# Patient Record
Sex: Female | Born: 1955
Health system: Southern US, Community
[De-identification: ages and names within clinical notes are randomized; demographics above are authoritative.]

## PROBLEM LIST (undated history)

## (undated) DIAGNOSIS — M1990 Unspecified osteoarthritis, unspecified site: Secondary | ICD-10-CM

## (undated) DIAGNOSIS — C801 Malignant (primary) neoplasm, unspecified: Secondary | ICD-10-CM

## (undated) DIAGNOSIS — E785 Hyperlipidemia, unspecified: Secondary | ICD-10-CM

## (undated) DIAGNOSIS — J45909 Unspecified asthma, uncomplicated: Secondary | ICD-10-CM

## (undated) DIAGNOSIS — T7840XA Allergy, unspecified, initial encounter: Secondary | ICD-10-CM

## (undated) DIAGNOSIS — C73 Malignant neoplasm of thyroid gland: Secondary | ICD-10-CM

## (undated) DIAGNOSIS — K219 Gastro-esophageal reflux disease without esophagitis: Secondary | ICD-10-CM

## (undated) DIAGNOSIS — I10 Essential (primary) hypertension: Secondary | ICD-10-CM

## (undated) DIAGNOSIS — E079 Disorder of thyroid, unspecified: Secondary | ICD-10-CM

## (undated) HISTORY — DX: Malignant neoplasm of thyroid gland: C73

## (undated) HISTORY — DX: Hyperlipidemia, unspecified: E78.5

## (undated) HISTORY — DX: Unspecified asthma, uncomplicated: J45.909

## (undated) HISTORY — DX: Allergy, unspecified, initial encounter: T78.40XA

## (undated) HISTORY — DX: Disorder of thyroid, unspecified: E07.9

## (undated) HISTORY — DX: Malignant (primary) neoplasm, unspecified: C80.1

## (undated) HISTORY — DX: Gastro-esophageal reflux disease without esophagitis: K21.9

## (undated) HISTORY — DX: Unspecified osteoarthritis, unspecified site: M19.90

## (undated) HISTORY — DX: Essential (primary) hypertension: I10

---

## 1980-09-12 HISTORY — PX: CHOLECYSTECTOMY: SHX55

## 1991-09-13 HISTORY — PX: ABDOMINAL HYSTERECTOMY: SHX81

## 1993-09-12 HISTORY — PX: OTHER SURGICAL HISTORY: SHX169

## 2006-09-12 HISTORY — PX: OTHER SURGICAL HISTORY: SHX169

## 2007-09-13 HISTORY — PX: OTHER SURGICAL HISTORY: SHX169

## 2010-09-12 HISTORY — PX: OTHER SURGICAL HISTORY: SHX169

## 2016-10-17 LAB — BASIC METABOLIC PANEL
BUN: 16 (ref 4–21)
Creatinine: 0.6 (ref 0.5–1.1)
Glucose: 108
Potassium: 4.5 (ref 3.4–5.3)
Sodium: 139 (ref 137–147)

## 2016-10-17 LAB — LIPID PANEL: LDL Cholesterol: 103

## 2016-10-17 LAB — HEPATIC FUNCTION PANEL: ALT: 22 (ref 7–35)

## 2018-07-13 HISTORY — PX: LAPAROSCOPIC GASTRIC SLEEVE RESECTION: SHX5895

## 2019-06-07 ENCOUNTER — Ambulatory Visit (INDEPENDENT_AMBULATORY_CARE_PROVIDER_SITE_OTHER): Payer: Medicare HMO | Admitting: Family Medicine

## 2019-06-07 ENCOUNTER — Other Ambulatory Visit: Payer: Self-pay

## 2019-06-07 ENCOUNTER — Encounter: Payer: Self-pay | Admitting: Family Medicine

## 2019-06-07 VITALS — BP 126/82 | HR 69 | Temp 98.1°F | Ht 67.0 in | Wt 225.6 lb

## 2019-06-07 DIAGNOSIS — R350 Frequency of micturition: Secondary | ICD-10-CM

## 2019-06-07 DIAGNOSIS — N951 Menopausal and female climacteric states: Secondary | ICD-10-CM

## 2019-06-07 DIAGNOSIS — R300 Dysuria: Secondary | ICD-10-CM | POA: Diagnosis not present

## 2019-06-07 DIAGNOSIS — M069 Rheumatoid arthritis, unspecified: Secondary | ICD-10-CM | POA: Diagnosis not present

## 2019-06-07 DIAGNOSIS — Z23 Encounter for immunization: Secondary | ICD-10-CM

## 2019-06-07 LAB — POCT URINALYSIS DIPSTICK
Bilirubin, UA: NEGATIVE
Blood, UA: NEGATIVE
Glucose, UA: NEGATIVE
Ketones, UA: POSITIVE
Leukocytes, UA: NEGATIVE
Nitrite, UA: NEGATIVE
Protein, UA: NEGATIVE
Spec Grav, UA: 1.01 (ref 1.010–1.025)
Urobilinogen, UA: 0.2 E.U./dL
pH, UA: 7 (ref 5.0–8.0)

## 2019-06-07 MED ORDER — PREMARIN 0.625 MG/GM VA CREA: TOPICAL_CREAM | VAGINAL | 5 refills | Status: DC

## 2019-06-07 NOTE — Progress Notes (Signed)
Mercedes Hodge is a 63 y.o. female  Chief Complaint  Patient presents with  . Establish Care    est care/ frequent UTI burning, urgency / wants tdap and pneumonia    HPI: Mercedes Hodge is a 63 y.o. female here to establish care with our office. 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 She complains of 3 mo h/o dysuria, urgency, frequency. No gross hematuria. No f/c, n/v, flank or back pain. She saw urology about 3 years ago, cystoscopy showed some scaring of the bladder wall d/t h/o UTI's but otherwise normal. No dx of interstitial cystitis. She has above constellation of symptoms intermittently and maybe 2-3x/yr but symptoms last weeks or months. She was admitted to hospital last year with sepsis secondary to UTI. She does endorse mild vaginal dryness and wonders if premarin or similar would help.  She had gastric sleeve done in 07/2018 and has lost 70lbs.   She has RA and needs referral to establish with rheum. Overall symptoms are well-controlled.   Last labs: 11/2018  Specialists: rheum  Last PAP: 2018 Last mammo: 03/2018 Last Dexa: 03/2018 Last colonoscopy: 2018   Past Medical History:  Diagnosis Date  . Arthritis   . Asthma   . Cancer ([place])    thyroid  . Hyperlipidemia   . Hypertension     Past Surgical History:  Procedure Laterality Date  . ABDOMINAL HYSTERECTOMY    . CHOLECYSTECTOMY    . LAPAROSCOPIC GASTRIC SLEEVE RESECTION    . left knee replacement    . right ankle replacement    . right knee replacement    . trimallar FX     right ankle    Social History   Socioeconomic History  . Marital status: Unknown    Spouse name: Not on file  . Number of children: Not on file  . Years of education: Not on file  . Highest education level: Not on file  Occupational History  . Not on file  Social Needs  . Financial resource strain: Not on file  . Food insecurity    Worry: Not on file    Inability: Not on file  . Transportation needs    Medical: Not  on file    Non-medical: Not on file  Tobacco Use  . Smoking status: Never Smoker  . Smokeless tobacco: Never Used  Substance and Sexual Activity  . Alcohol use: Never    Frequency: Never  . Drug use: Never  . Sexual activity: Not on file  Lifestyle  . Physical activity    Days per week: Not on file    Minutes per session: Not on file  . Stress: Not on file  Relationships  . Social Herbalist on phone: Not on file    Gets together: Not on file    Attends religious service: Not on file    Active member of club or organization: Not on file    Attends meetings of clubs or organizations: Not on file    Relationship status: Not on file  . Intimate partner violence    Fear of current or ex partner: Not on file    Emotionally abused: Not on file    Physically abused: Not on file    Forced sexual activity: Not on file  Other Topics Concern  . Not on file  Social History Narrative  . Not on file    Family History  Problem Relation Age of Onset  . Cancer  Father        thyroid cancer     Immunization History  Administered Date(s) Administered  . Influenza-Unspecified 05/22/2019    Outpatient Encounter Medications as of 06/07/2019  Medication Sig  . amLODipine-olmesartan (AZOR) 5-40 MG tablet Take 1 tablet by mouth daily.  . Cephalexin (KEFLEX PO) Take 100 mg by mouth. Prior to dental procedure  . Cholecalciferol (VITAMIN D) 50 MCG (2000 UT) tablet Take 2,000 Units by mouth daily.  . diclofenac sodium (VOLTAREN) 1 % GEL Apply topically 4 (four) times daily.  . fluticasone furoate-vilanterol (BREO ELLIPTA) 100-25 MCG/INH AEPB Inhale 1 puff into the lungs daily.  . hydrochlorothiazide (MICROZIDE) 12.5 MG capsule Take 12.5 mg by mouth daily.  . hydroxychloroquine (PLAQUENIL) 200 MG tablet Take 400 mg by mouth daily.  . pravastatin (PRAVACHOL) 20 MG tablet Take 20 mg by mouth daily.  Marland Kitchen conjugated estrogens (PREMARIN) vaginal cream 1 applicatorful twice per week   No  facility-administered encounter medications on file as of 06/07/2019.      ROS: Pertinent positives and negatives noted in HPI. Remainder of ROS non-contributory   Allergies  Allergen Reactions  . Lovenox [Enoxaparin Sodium]     rash    BP 126/82   Pulse 69   Temp 98.1 F (36.7 C) (Oral)   Ht 5\' 7"  (1.702 m)   Wt 225 lb 9.6 oz (102.3 kg)   SpO2 96%   BMI 35.33 kg/m   Physical Exam  Constitutional: She is oriented to person, place, and time. She appears well-developed and well-nourished. No distress.  Abdominal: Soft. Bowel sounds are normal. She exhibits no distension and no mass. There is no abdominal tenderness. There is no CVA tenderness.  Genitourinary:    Genitourinary Comments: deferred   Musculoskeletal:        General: No edema.  Neurological: She is alert and oriented to person, place, and time.  Skin: Skin is warm and dry.  Psychiatric: She has a normal mood and affect. Her behavior is normal.     Component     Latest Ref Rng & Units 06/07/2019  Color, UA      yellow  Clarity, UA      clear  Glucose     Negative Negative  Bilirubin, UA      neg  Ketones, UA      pos  Specific Gravity, UA     1.010 - 1.025 1.010  RBC, UA      neg  pH, UA     5.0 - 8.0 7.0  Protein,UA     Negative Negative  Urobilinogen, UA     0.2 or 1.0 E.U./dL 0.2  Nitrite, UA      neg  Leukocytes,UA     Negative Negative      A/P:  1. Urine frequency 2. Dysuria - POCT Urinalysis Dipstick - normal - current symptoms x 3 mo but pt has similar episodes on/off x years as well as h/o recurrent UTI  3. Need for Tdap vaccination - Tdap vaccine greater than or equal to 7yo IM  4. Need for pneumococcal vaccination - Pneumococcal conjugate vaccine 13-valent IM  5. Vaginal dryness, menopausal Rx: - conjugated estrogens (PREMARIN) vaginal cream; 1 applicatorful twice per week  Dispense: 45 g; Refill: 5 - f/u in 3 mo or sooner PRN  6. Rheumatoid arthritis involving  multiple sites, unspecified rheumatoid factor presence ([place]) - Ambulatory referral to Rheumatology

## 2019-06-18 NOTE — Progress Notes (Signed)
Office Visit Note  Patient: Mercedes Hodge             Date of Birth: 10/05/1955           MRN: [id number]             PCP: Ronnald Nian, DO Referring: Ronnald Nian, DO Visit Date: 07/02/2019 Occupation: RN  Subjective:  Rheumatoid arthritis.   History of Present Illness: Mercedes Hodge is a 63 y.o. female seen in consultation per request of her PCP.  She moved to [place] from [place] about 2 months ago.  She used to work as a Therapist, sports and recently retired.  She states she had right ankle joint fracture at age 59 and had multiple surgeries on that ankle.  In 2012 she had right ankle joint replacement.  She was followed by rheumatologist because of osteoarthritis.  She states last year she started having increased joint pain and stiffness.  At the time her rheumatologist noticed changes in her skin and suspected parvovirus infection.  The parvo titers were positive.  2 months later she started having increased joint pain and joint swelling.  She was diagnosed with rheumatoid arthritis on the basis of elevated C-reactive protein and positive anti-CCP antibody.  Her rheumatoid factor was negative and ANA, ENA were negative.  She was initially placed on Medrol 4 mg p.o. daily which she took for several months along with Voltaren p.o. daily.  In November 2019 she was a started on Plaquenil and Medrol was discontinued in March 2020.  She also came off of p.o. Voltaren.  She has been using topical diclofenac gel.  She states on the Plaquenil she has been doing well without any joint swelling.  She continues to have some discomfort over her CMC joints.  There is no joint swelling.  She has some stiffness in her hips and knee joints when she gets up from the chair and that it gets better.  Activities of Daily Living:  Patient reports morning stiffness for 10 minutes.   Patient Denies nocturnal pain.  Difficulty dressing/grooming: Denies Difficulty climbing stairs: Reports Difficulty getting  out of chair: Denies Difficulty using hands for taps, buttons, cutlery, and/or writing: Denies  Review of Systems  Constitutional: Negative for fatigue, night sweats, weight gain and weight loss.  HENT: Positive for nose dryness. Negative for mouth sores, trouble swallowing, trouble swallowing and mouth dryness.   Eyes: Positive for dryness. Negative for pain, redness, itching and visual disturbance.  Respiratory: Negative for cough, shortness of breath, wheezing and difficulty breathing.   Cardiovascular: Negative for chest pain, palpitations, hypertension, irregular heartbeat and swelling in legs/feet.  Gastrointestinal: Negative for blood in stool, constipation and diarrhea.  Endocrine: Negative for increased urination.  Genitourinary: Negative for difficulty urinating, painful urination and vaginal dryness.  Musculoskeletal: Positive for arthralgias, joint pain and morning stiffness. Negative for joint swelling, myalgias, muscle weakness, muscle tenderness and myalgias.  Skin: Negative for color change, rash, hair loss, skin tightness, ulcers and sensitivity to sunlight.  Allergic/Immunologic: Negative for susceptible to infections.  Neurological: Negative for dizziness, light-headedness, numbness, headaches, memory loss, night sweats and weakness.  Hematological: Positive for bruising/bleeding tendency. Negative for swollen glands.  Psychiatric/Behavioral: Negative for depressed mood, confusion and sleep disturbance. The patient is not nervous/anxious.     PMFS History:  Patient Active Problem List   Diagnosis Date Noted  . Rheumatoid arthritis of multiple sites with negative rheumatoid factor ([place]) 07/02/2019  . High risk medication use 07/02/2019  .  Primary osteoarthritis of both hands 07/02/2019  . Status post total bilateral knee replacement 07/02/2019  . Status post right ankle joint replacement 07/02/2019  . Essential hypertension 07/02/2019  . History of asthma 07/02/2019  .  History of thyroid cancer 07/02/2019  . Postoperative hypothyroidism 07/02/2019  . Dyslipidemia 07/02/2019  . Gastroesophageal reflux disease without esophagitis 07/02/2019  . Osteopenia of multiple sites 07/02/2019    Past Medical History:  Diagnosis Date  . Arthritis   . Asthma   . Cancer ([place])    thyroid  . Hyperlipidemia   . Hypertension     Family History  Problem Relation Age of Onset  . COPD Mother   . Heart disease Mother   . Cancer Father        thyroid cancer  . Juvenile Rhematoid Arthritis Sister   . Thyroid cancer Brother   . Thyroid cancer Daughter   . Healthy Daughter   . Healthy Son   . Healthy Daughter    Past Surgical History:  Procedure Laterality Date  . ABDOMINAL HYSTERECTOMY  1993  . CHOLECYSTECTOMY  1982  . LAPAROSCOPIC GASTRIC SLEEVE RESECTION  07/2018  . left knee replacement  2008  . right ankle replacement  2012  . right knee replacement  2009  . trimallar FX  1995   right ankle   Social History   Social History Narrative  . Not on file   Immunization History  Administered Date(s) Administered  . Influenza-Unspecified 05/22/2019  . Pneumococcal Conjugate-13 06/07/2019  . Tdap 06/07/2019     Objective: Vital Signs: BP 124/78 (BP Location: Right Arm, Patient Position: Sitting, Cuff Size: Normal)   Pulse 64   Resp 15   Ht 5' 6.5" (1.689 m)   Wt 223 lb (101.2 kg)   BMI 35.45 kg/m    Physical Exam Vitals signs and nursing note reviewed.  Constitutional:      Appearance: She is well-developed.  HENT:     Head: Normocephalic and atraumatic.  Eyes:     Conjunctiva/sclera: Conjunctivae normal.  Neck:     Musculoskeletal: Normal range of motion.  Cardiovascular:     Rate and Rhythm: Normal rate and regular rhythm.     Heart sounds: Normal heart sounds.  Pulmonary:     Effort: Pulmonary effort is normal.     Breath sounds: Normal breath sounds.  Abdominal:     General: Bowel sounds are normal.     Palpations: Abdomen is  soft.  Lymphadenopathy:     Cervical: No cervical adenopathy.  Skin:    General: Skin is warm and dry.     Capillary Refill: Capillary refill takes less than 2 seconds.  Neurological:     Mental Status: She is alert and oriented to person, place, and time.  Psychiatric:        Behavior: Behavior normal.      Musculoskeletal Exam: C-spine, thoracic and lumbar spine were in good range of motion.  Shoulder joints, elbow joints, wrist joints with good range of motion.  She has bilateral CMC and DIP thickening.  No synovitis was noted.  Bilateral knee joints are replaced with some warmth without any effusion.  She has right ankle joint replacement and thickening.  No synovitis was noted over MTPs and PIPs of her feet.  CDAI Exam: CDAI Score: 0.2  Patient Global: 1 mm; Provider Global: 1 mm Swollen: 0 ; Tender: 0  Joint Exam   No joint exam has been documented for this visit  There is currently no information documented on the homunculus. Go to the Rheumatology activity and complete the homunculus joint exam.  Investigation: No additional findings.  Imaging: No results found.  Recent Labs: Lab Results  Component Value Date   NA 139 10/17/2016   K 4.5 10/17/2016   BUN 16 10/17/2016   CREATININE 0.6 10/17/2016   ALT 22 10/17/2016    Speciality Comments: No specialty comments available.  Procedures:  No procedures performed Allergies: Lovenox [enoxaparin sodium]   Assessment / Plan:     Visit Diagnoses: Rheumatoid arthritis of multiple sites with negative rheumatoid factor (HCC) - Positive anti-CCP and elevated CRP.  Patient was diagnosed in [place] by her rheumatologist.  She had history of inflammatory arthritis.  She was placed on Plaquenil in November 2019.  She was also on Medrol for several months and discontinued in March 2020.  She was on diclofenac p.o. which she discontinued.  She has been using topical diclofenac for [facility] joint discomfort.  She had no synovitis on  examination today.  She has been taking Plaquenil on regular basis without any side effects.  Diagnosed in [place]. - Plan: XR Hand 2 View Right, XR Hand 2 View Left, x-rays were consistent with rheumatoid arthritis and osteoarthritis overlap.  Possible erosion was noted in the right ulnar styloid.  XR Foot 2 Views Right, XR Foot 2 Views Left, postsurgical changes were noted in bilateral feet.  Right ankle joint is replaced.  Sedimentation rate, Rheumatoid factor, Cyclic citrul peptide antibody, IgG, 14-3-3 eta Protein  High risk medication use - PLQ 400 mg p.o. daily.  Last eye exam was in March 2020 per patient in [place]. - Plan: CBC with Differential/Platelet, COMPLETE METABOLIC PANEL WITH GFR  Primary osteoarthritis of both hands-she has DIP and CMC thickening.  Status post total bilateral knee replacement-doing well.  She has discomfort and some stiffness.  Status post right ankle joint replacement-she had Charcot joint which required total ankle replacement.  Essential hypertension-she is on medications.  History of asthma-recent diagnosis.  History of thyroid cancer  Postoperative hypothyroidism  Dyslipidemia-she is on statins.  Gastroesophageal reflux disease without esophagitis-she takes Protonix on as needed basis.  Osteopenia of multiple sites-I reviewed her bone density from April 2019 which showed a T score of -1.1.  Orders: Orders Placed This Encounter  Procedures  . XR Hand 2 View Right  . XR Hand 2 View Left  . XR Foot 2 Views Right  . XR Foot 2 Views Left  . CBC with Differential/Platelet  . COMPLETE METABOLIC PANEL WITH GFR  . Sedimentation rate  . Rheumatoid factor  . Cyclic citrul peptide antibody, IgG  . 14-3-3 eta Protein   No orders of the defined types were placed in this encounter.   Face-to-face time spent with patient was 50 minutes. Greater than 50% of time was spent in counseling and coordination of care.  Follow-Up Instructions: Return  in about 3 months (around 10/02/2019) for Rheumatoid arthritis.   Bo Merino, MD  Note - This record has been created using Editor, commissioning.  Chart creation errors have been sought, but may not always  have been located. Such creation errors do not reflect on  the standard of medical care.

## 2019-06-26 ENCOUNTER — Encounter: Payer: Self-pay | Admitting: Family Medicine

## 2019-07-02 ENCOUNTER — Ambulatory Visit (INDEPENDENT_AMBULATORY_CARE_PROVIDER_SITE_OTHER): Payer: Medicare HMO

## 2019-07-02 ENCOUNTER — Ambulatory Visit: Payer: Self-pay

## 2019-07-02 ENCOUNTER — Other Ambulatory Visit: Payer: Self-pay

## 2019-07-02 ENCOUNTER — Ambulatory Visit (INDEPENDENT_AMBULATORY_CARE_PROVIDER_SITE_OTHER): Payer: Medicare HMO | Admitting: Rheumatology

## 2019-07-02 ENCOUNTER — Encounter: Payer: Self-pay | Admitting: Rheumatology

## 2019-07-02 VITALS — BP 124/78 | HR 64 | Resp 15 | Ht 66.5 in | Wt 223.0 lb

## 2019-07-02 DIAGNOSIS — M0609 Rheumatoid arthritis without rheumatoid factor, multiple sites: Secondary | ICD-10-CM | POA: Diagnosis not present

## 2019-07-02 DIAGNOSIS — K219 Gastro-esophageal reflux disease without esophagitis: Secondary | ICD-10-CM

## 2019-07-02 DIAGNOSIS — Z96653 Presence of artificial knee joint, bilateral: Secondary | ICD-10-CM | POA: Insufficient documentation

## 2019-07-02 DIAGNOSIS — Z96661 Presence of right artificial ankle joint: Secondary | ICD-10-CM | POA: Insufficient documentation

## 2019-07-02 DIAGNOSIS — Z79899 Other long term (current) drug therapy: Secondary | ICD-10-CM | POA: Diagnosis not present

## 2019-07-02 DIAGNOSIS — Z85850 Personal history of malignant neoplasm of thyroid: Secondary | ICD-10-CM | POA: Diagnosis not present

## 2019-07-02 DIAGNOSIS — M19041 Primary osteoarthritis, right hand: Secondary | ICD-10-CM | POA: Diagnosis not present

## 2019-07-02 DIAGNOSIS — E890 Postprocedural hypothyroidism: Secondary | ICD-10-CM | POA: Insufficient documentation

## 2019-07-02 DIAGNOSIS — M19042 Primary osteoarthritis, left hand: Secondary | ICD-10-CM

## 2019-07-02 DIAGNOSIS — Z8709 Personal history of other diseases of the respiratory system: Secondary | ICD-10-CM | POA: Diagnosis not present

## 2019-07-02 DIAGNOSIS — I10 Essential (primary) hypertension: Secondary | ICD-10-CM | POA: Insufficient documentation

## 2019-07-02 DIAGNOSIS — E785 Hyperlipidemia, unspecified: Secondary | ICD-10-CM

## 2019-07-02 DIAGNOSIS — J4520 Mild intermittent asthma, uncomplicated: Secondary | ICD-10-CM | POA: Insufficient documentation

## 2019-07-02 DIAGNOSIS — M8589 Other specified disorders of bone density and structure, multiple sites: Secondary | ICD-10-CM

## 2019-07-02 NOTE — Patient Instructions (Signed)
Standing Labs We placed an order today for your standing lab work.    Please come back and get your standing labs on 07/05/2019  We have open lab daily Monday through Thursday from 8:30-12:30 PM and 1:30-4:30 PM and Friday from 8:30-12:30 PM and 1:30-4:00 PM at the office of Dr. Bo Merino.   You may experience shorter wait times on Monday and Friday afternoons. The office is located at [address] [place], [place], [place] [address] No appointment is necessary.   Labs are drawn by [facility].  You may receive a bill from [facility] for your lab work.  If you wish to have your labs drawn at another location, please call the office 24 hours in advance to send orders.  If you have any questions regarding directions or hours of operation,  please call [phone].   Just as a reminder please drink plenty of water prior to coming for your lab work. Thanks!

## 2019-07-02 NOTE — Progress Notes (Signed)
Pharmacy Note  Subjective: Patient presents today to the [facility] to see Dr. Estanislado Pandy.  Patient seen by the pharmacist for counseling on hydroxychloroquine rheumatoid arthritis.  She has been on Plaquenil prescribed by another rheumatologist and tolerated well.  Objective: CMP     Component Value Date/Time   NA 139 10/17/2016   K 4.5 10/17/2016   BUN 16 10/17/2016   CREATININE 0.6 10/17/2016   ALT 22 10/17/2016    CBC No results found for: WBC, RBC, HGB, HCT, PLT, MCV, MCH, MCHC, RDW, LYMPHSABS, MONOABS, EOSABS, BASOSABS  Assessment/Plan: Patient was counseled on the purpose, proper use, and adverse effects of hydroxychloroquine including nausea/diarrhea, skin rash, headaches, and sun sensitivity.  Discussed importance of annual eye exams while on hydroxychloroquine to monitor to ocular toxicity and discussed importance of frequent laboratory monitoring.  Patient states she had normal eye exam in February.  Provided patient with eye exam form for baseline ophthalmologic exam and standing lab instructions.  Provided patient with educational materials on hydroxychloroquine and answered all questions.  Patient consented to hydroxychloroquine.  Will upload consent in the media tab.     Dose will be Plaquenil 200 mg twice daily based on weight of 101.2 kg and height of 5\' 6" .   All questions encouraged and answered.  Instructed patient to call with any questions or concerns.   Mariella Saa, PharmD, [place], [place] Clinical Specialty Pharmacist [phone]  07/02/2019 10:59 AM

## 2019-07-05 ENCOUNTER — Other Ambulatory Visit: Payer: Self-pay | Admitting: *Deleted

## 2019-07-05 DIAGNOSIS — Z79899 Other long term (current) drug therapy: Secondary | ICD-10-CM | POA: Diagnosis not present

## 2019-07-05 DIAGNOSIS — M0609 Rheumatoid arthritis without rheumatoid factor, multiple sites: Secondary | ICD-10-CM | POA: Diagnosis not present

## 2019-07-10 LAB — CBC WITH DIFFERENTIAL/PLATELET
Absolute Monocytes: 680 cells/uL (ref 200–950)
Basophils Absolute: 48 cells/uL (ref 0–200)
Basophils Relative: 0.6 %
Eosinophils Absolute: 208 cells/uL (ref 15–500)
Eosinophils Relative: 2.6 %
HCT: 40.9 % (ref 35.0–45.0)
Hemoglobin: 13.8 g/dL (ref 11.7–15.5)
Lymphs Abs: 1960 cells/uL (ref 850–3900)
MCH: 32.2 pg (ref 27.0–33.0)
MCHC: 33.7 g/dL (ref 32.0–36.0)
MCV: 95.3 fL (ref 80.0–100.0)
MPV: 10.8 fL (ref 7.5–12.5)
Monocytes Relative: 8.5 %
Neutro Abs: 5104 cells/uL (ref 1500–7800)
Neutrophils Relative %: 63.8 %
Platelets: 204 10*3/uL (ref 140–400)
RBC: 4.29 10*6/uL (ref 3.80–5.10)
RDW: 13.1 % (ref 11.0–15.0)
Total Lymphocyte: 24.5 %
WBC: 8 10*3/uL (ref 3.8–10.8)

## 2019-07-10 LAB — COMPLETE METABOLIC PANEL WITH GFR
AG Ratio: 1.5 (calc) (ref 1.0–2.5)
ALT: 14 U/L (ref 6–29)
AST: 21 U/L (ref 10–35)
Albumin: 4 g/dL (ref 3.6–5.1)
Alkaline phosphatase (APISO): 68 U/L (ref 37–153)
BUN: 20 mg/dL (ref 7–25)
CO2: 28 mmol/L (ref 20–32)
Calcium: 9 mg/dL (ref 8.6–10.4)
Chloride: 101 mmol/L (ref 98–110)
Creat: 0.85 mg/dL (ref 0.50–0.99)
GFR, Est African American: 83 mL/min/{1.73_m2} (ref >=60)
GFR, Est Non African American: 72 mL/min/{1.73_m2} (ref >=60)
Globulin: 2.7 g/dL (calc) (ref 1.9–3.7)
Glucose, Bld: 93 mg/dL (ref 65–99)
Potassium: 3.8 mmol/L (ref 3.5–5.3)
Sodium: 137 mmol/L (ref 135–146)
Total Bilirubin: 0.6 mg/dL (ref 0.2–1.2)
Total Protein: 6.7 g/dL (ref 6.1–8.1)

## 2019-07-10 LAB — 14-3-3 ETA PROTEIN: 14-3-3 eta Protein: <0.2 ng/mL (ref <0.2)

## 2019-07-10 LAB — CYCLIC CITRUL PEPTIDE ANTIBODY, IGG: Cyclic Citrullin Peptide Ab: <16 UNITS

## 2019-07-10 LAB — RHEUMATOID FACTOR: Rheumatoid fact SerPl-aCnc: <14 IU/mL (ref <14)

## 2019-07-10 LAB — SEDIMENTATION RATE: Sed Rate: 6 mm/h (ref 0–30)

## 2019-07-24 ENCOUNTER — Ambulatory Visit: Payer: Self-pay | Admitting: Family Medicine

## 2019-07-26 ENCOUNTER — Ambulatory Visit: Payer: Medicare HMO | Admitting: Rheumatology

## 2019-08-05 ENCOUNTER — Encounter: Payer: Self-pay | Admitting: Family Medicine

## 2019-08-07 DIAGNOSIS — M5032 Other cervical disc degeneration, mid-cervical region, unspecified level: Secondary | ICD-10-CM | POA: Diagnosis not present

## 2019-08-07 DIAGNOSIS — M9901 Segmental and somatic dysfunction of cervical region: Secondary | ICD-10-CM | POA: Diagnosis not present

## 2019-08-07 DIAGNOSIS — M545 Low back pain: Secondary | ICD-10-CM | POA: Diagnosis not present

## 2019-08-07 DIAGNOSIS — M9903 Segmental and somatic dysfunction of lumbar region: Secondary | ICD-10-CM | POA: Diagnosis not present

## 2019-08-09 ENCOUNTER — Other Ambulatory Visit: Payer: Self-pay | Admitting: Family Medicine

## 2019-08-09 NOTE — Telephone Encounter (Signed)
Copied from [place] [id number]. Topic: Quick Communication - Rx Refill/Question >> Aug 09, 2019  9:47 AM Leward Quan A wrote: Medication: pravastatin (PRAVACHOL) 20 MG tablet, levothyroxine (SYNTHROID) 150 MCG tablet  Has the patient contacted their pharmacy? Yes.   (Agent: If no, request that the patient contact the pharmacy for the refill.) (Agent: If yes, when and what did the pharmacy advise?)  Preferred Pharmacy (with phone number or street name): [facility], [place] [phone] (Phone) [phone] (Fax)    Agent: Please be advised that RX refills may take up to 3 business days. We ask that you follow-up with your pharmacy.

## 2019-08-09 NOTE — Telephone Encounter (Signed)
Requested medication (s) are due for refill today: yes  Requested medication (s) are on the active medication list: yes  Future visit scheduled: yes  Notes to clinic:  Last filled by historical provider  Review for refill    Requested Prescriptions  Pending Prescriptions Disp Refills   pravastatin (PRAVACHOL) 20 MG tablet       Sig: Take 1 tablet (20 mg total) by mouth daily.     Cardiovascular:  Antilipid - Statins Failed - 08/09/2019 10:26 AM      Failed - Total Cholesterol in normal range and within 360 days    No results found for: CHOL, POCCHOL       Failed - LDL in normal range and within 360 days    LDL Cholesterol  Date Value Ref Range Status  10/17/2016 103  Final         Failed - HDL in normal range and within 360 days    No results found for: HDL       Failed - Triglycerides in normal range and within 360 days    No results found for: TRIG       Passed - Patient is not pregnant      Passed - Valid encounter within last 12 months    Recent Outpatient Visits          2 months ago Dysuria   [facility], [place], DO      Future Appointments            In 1 month [place], Garvin Fila, DO [facility], [place]   In 1 month Deveshwar, [place], MD [facility]            levothyroxine (SYNTHROID) 150 MCG tablet       Sig: Take 1 tablet (150 mcg total) by mouth every other day.     Endocrinology:  Hypothyroid Agents Failed - 08/09/2019 10:26 AM      Failed - TSH needs to be rechecked within 3 months after an abnormal result. Refill until TSH is due.      Failed - TSH in normal range and within 360 days    No results found for: TSH       Passed - Valid encounter within last 12 months    Recent Outpatient Visits          2 months ago Dysuria   [facility] [place], [place], DO      Future Appointments            In 1 month Cir[place], Garvin Fila, DO [facility] [place], [place]   In 1 month Bo Merino, [facility]

## 2019-08-12 MED ORDER — PRAVASTATIN SODIUM 20 MG PO TABS: 20.0000 mg | ORAL_TABLET | Freq: Every day | ORAL | 1 refills | Status: DC

## 2019-08-12 MED ORDER — LEVOTHYROXINE SODIUM 150 MCG PO TABS: 150.0000 ug | ORAL_TABLET | ORAL | 1 refills | Status: DC

## 2019-08-13 DIAGNOSIS — M545 Low back pain: Secondary | ICD-10-CM | POA: Diagnosis not present

## 2019-08-13 DIAGNOSIS — M9903 Segmental and somatic dysfunction of lumbar region: Secondary | ICD-10-CM | POA: Diagnosis not present

## 2019-08-13 DIAGNOSIS — M5032 Other cervical disc degeneration, mid-cervical region, unspecified level: Secondary | ICD-10-CM | POA: Diagnosis not present

## 2019-08-13 DIAGNOSIS — M9901 Segmental and somatic dysfunction of cervical region: Secondary | ICD-10-CM | POA: Diagnosis not present

## 2019-08-15 DIAGNOSIS — M5032 Other cervical disc degeneration, mid-cervical region, unspecified level: Secondary | ICD-10-CM | POA: Diagnosis not present

## 2019-08-15 DIAGNOSIS — M545 Low back pain: Secondary | ICD-10-CM | POA: Diagnosis not present

## 2019-08-15 DIAGNOSIS — M9901 Segmental and somatic dysfunction of cervical region: Secondary | ICD-10-CM | POA: Diagnosis not present

## 2019-08-15 DIAGNOSIS — M9903 Segmental and somatic dysfunction of lumbar region: Secondary | ICD-10-CM | POA: Diagnosis not present

## 2019-08-20 DIAGNOSIS — M9903 Segmental and somatic dysfunction of lumbar region: Secondary | ICD-10-CM | POA: Diagnosis not present

## 2019-08-20 DIAGNOSIS — M545 Low back pain: Secondary | ICD-10-CM | POA: Diagnosis not present

## 2019-08-20 DIAGNOSIS — M9901 Segmental and somatic dysfunction of cervical region: Secondary | ICD-10-CM | POA: Diagnosis not present

## 2019-08-20 DIAGNOSIS — M5032 Other cervical disc degeneration, mid-cervical region, unspecified level: Secondary | ICD-10-CM | POA: Diagnosis not present

## 2019-08-22 DIAGNOSIS — M5032 Other cervical disc degeneration, mid-cervical region, unspecified level: Secondary | ICD-10-CM | POA: Diagnosis not present

## 2019-08-22 DIAGNOSIS — M9901 Segmental and somatic dysfunction of cervical region: Secondary | ICD-10-CM | POA: Diagnosis not present

## 2019-08-22 DIAGNOSIS — M545 Low back pain: Secondary | ICD-10-CM | POA: Diagnosis not present

## 2019-08-22 DIAGNOSIS — M9903 Segmental and somatic dysfunction of lumbar region: Secondary | ICD-10-CM | POA: Diagnosis not present

## 2019-08-27 DIAGNOSIS — M545 Low back pain: Secondary | ICD-10-CM | POA: Diagnosis not present

## 2019-08-27 DIAGNOSIS — M9901 Segmental and somatic dysfunction of cervical region: Secondary | ICD-10-CM | POA: Diagnosis not present

## 2019-08-27 DIAGNOSIS — M5032 Other cervical disc degeneration, mid-cervical region, unspecified level: Secondary | ICD-10-CM | POA: Diagnosis not present

## 2019-08-27 DIAGNOSIS — M9903 Segmental and somatic dysfunction of lumbar region: Secondary | ICD-10-CM | POA: Diagnosis not present

## 2019-08-29 DIAGNOSIS — M9901 Segmental and somatic dysfunction of cervical region: Secondary | ICD-10-CM | POA: Diagnosis not present

## 2019-08-29 DIAGNOSIS — M9903 Segmental and somatic dysfunction of lumbar region: Secondary | ICD-10-CM | POA: Diagnosis not present

## 2019-08-29 DIAGNOSIS — M545 Low back pain: Secondary | ICD-10-CM | POA: Diagnosis not present

## 2019-08-29 DIAGNOSIS — M5032 Other cervical disc degeneration, mid-cervical region, unspecified level: Secondary | ICD-10-CM | POA: Diagnosis not present

## 2019-09-03 DIAGNOSIS — M9901 Segmental and somatic dysfunction of cervical region: Secondary | ICD-10-CM | POA: Diagnosis not present

## 2019-09-03 DIAGNOSIS — M5032 Other cervical disc degeneration, mid-cervical region, unspecified level: Secondary | ICD-10-CM | POA: Diagnosis not present

## 2019-09-03 DIAGNOSIS — M545 Low back pain: Secondary | ICD-10-CM | POA: Diagnosis not present

## 2019-09-03 DIAGNOSIS — M9903 Segmental and somatic dysfunction of lumbar region: Secondary | ICD-10-CM | POA: Diagnosis not present

## 2019-09-04 DIAGNOSIS — M545 Low back pain: Secondary | ICD-10-CM | POA: Diagnosis not present

## 2019-09-04 DIAGNOSIS — M9901 Segmental and somatic dysfunction of cervical region: Secondary | ICD-10-CM | POA: Diagnosis not present

## 2019-09-04 DIAGNOSIS — M5032 Other cervical disc degeneration, mid-cervical region, unspecified level: Secondary | ICD-10-CM | POA: Diagnosis not present

## 2019-09-04 DIAGNOSIS — M9903 Segmental and somatic dysfunction of lumbar region: Secondary | ICD-10-CM | POA: Diagnosis not present

## 2019-09-10 DIAGNOSIS — M545 Low back pain: Secondary | ICD-10-CM | POA: Diagnosis not present

## 2019-09-10 DIAGNOSIS — M9903 Segmental and somatic dysfunction of lumbar region: Secondary | ICD-10-CM | POA: Diagnosis not present

## 2019-09-10 DIAGNOSIS — M9901 Segmental and somatic dysfunction of cervical region: Secondary | ICD-10-CM | POA: Diagnosis not present

## 2019-09-10 DIAGNOSIS — M5032 Other cervical disc degeneration, mid-cervical region, unspecified level: Secondary | ICD-10-CM | POA: Diagnosis not present

## 2019-09-11 ENCOUNTER — Encounter: Payer: Self-pay | Admitting: Family Medicine

## 2019-09-11 ENCOUNTER — Telehealth (INDEPENDENT_AMBULATORY_CARE_PROVIDER_SITE_OTHER): Payer: Medicare HMO | Admitting: Family Medicine

## 2019-09-11 VITALS — BP 132/80 | HR 67 | Ht 66.5 in | Wt 217.0 lb

## 2019-09-11 DIAGNOSIS — F325 Major depressive disorder, single episode, in full remission: Secondary | ICD-10-CM

## 2019-09-11 DIAGNOSIS — E785 Hyperlipidemia, unspecified: Secondary | ICD-10-CM | POA: Diagnosis not present

## 2019-09-11 DIAGNOSIS — E890 Postprocedural hypothyroidism: Secondary | ICD-10-CM

## 2019-09-11 DIAGNOSIS — I10 Essential (primary) hypertension: Secondary | ICD-10-CM

## 2019-09-11 MED ORDER — LEVOTHYROXINE SODIUM 150 MCG PO TABS: 150.0000 ug | ORAL_TABLET | ORAL | 3 refills | Status: DC

## 2019-09-11 MED ORDER — AMLODIPINE-OLMESARTAN 5-40 MG PO TABS: 1.0000 | ORAL_TABLET | Freq: Every day | ORAL | 3 refills | Status: DC

## 2019-09-11 MED ORDER — LEVOTHYROXINE SODIUM 175 MCG PO TABS: 175.0000 ug | ORAL_TABLET | ORAL | 3 refills | Status: DC

## 2019-09-11 MED ORDER — ESCITALOPRAM OXALATE 10 MG PO TABS: 10.0000 mg | ORAL_TABLET | Freq: Every day | ORAL | 3 refills | Status: DC

## 2019-09-11 MED ORDER — PRAVASTATIN SODIUM 20 MG PO TABS: 20.0000 mg | ORAL_TABLET | Freq: Every day | ORAL | 3 refills | Status: DC

## 2019-09-11 NOTE — Progress Notes (Signed)
Virtual Visit via Video Note  I connected with Mercedes Hodge on 09/11/19 at 10:00 AM EST by a video enabled telemedicine application and verified that I am speaking with the correct person using two identifiers. Location patient: home Location provider: home office Persons participating in the virtual visit: patient, provider  I discussed the limitations of evaluation and management by telemedicine and the availability of in person appointments. The patient expressed understanding and agreed to proceed.  Chief Complaint  Patient presents with  . 3 month follow up    PHQ-0 GAD 7-0  . Medication Refill    Azor, synthroid,pravastatin, lexapro    HPI: Mercedes Hodge is a 63 y.o. female here for f/u on chronic medical issues including HTN, hyperlipidemia, h/o thyroid cancer, depression.  She requests medication refills. She states she is doing well, no acute issues or concerns.  She denies any falls. Appetite is good. We will plan on fasting labs at next OV in 6 mo. She had labs done in 11/2018 in [place] (available in care everywhere).   Depression screen [facility] 2/9 09/11/2019  Decreased Interest 0  Down, Depressed, Hopeless 0  PHQ - 2 Score 0  Altered sleeping 0  Tired, decreased energy 0  Change in appetite 0  Feeling bad or failure about yourself  0  Trouble concentrating 0  Moving slowly or fidgety/restless 0  Suicidal thoughts 0  PHQ-9 Score 0    GAD 7 : Generalized Anxiety Score 09/11/2019  Nervous, Anxious, on Edge 0  Control/stop worrying 0  Worry too much - different things 0  Trouble relaxing 0  Restless 0  Easily annoyed or irritable 0  Afraid - awful might happen 0  Total GAD 7 Score 0    Past Medical History:  Diagnosis Date  . Arthritis   . Asthma   . Cancer ([place])    thyroid  . Hyperlipidemia   . Hypertension     Past Surgical History:  Procedure Laterality Date  . ABDOMINAL HYSTERECTOMY  1993  . CHOLECYSTECTOMY  1982  . LAPAROSCOPIC GASTRIC SLEEVE  RESECTION  07/2018  . left knee replacement  2008  . right ankle replacement  2012  . right knee replacement  2009  . trimallar FX  1995   right ankle    Family History  Problem Relation Age of Onset  . COPD Mother   . Heart disease Mother   . Cancer Father        thyroid cancer  . Juvenile Rhematoid Arthritis Sister   . Thyroid cancer Brother   . Thyroid cancer Daughter   . Healthy Daughter   . Healthy Son   . Healthy Daughter     Social History   Tobacco Use  . Smoking status: Never Smoker  . Smokeless tobacco: Never Used  Substance Use Topics  . Alcohol use: Never  . Drug use: Never     Current Outpatient Medications:  .  amLODipine-olmesartan (AZOR) 5-40 MG tablet, Take 1 tablet by mouth daily., Disp: , Rfl:  .  Cholecalciferol (VITAMIN D) 50 MCG (2000 UT) tablet, Take 2,000 Units by mouth daily., Disp: , Rfl:  .  conjugated estrogens (PREMARIN) vaginal cream, 1 applicatorful twice per week, Disp: 45 g, Rfl: 5 .  diclofenac sodium (VOLTAREN) 1 % GEL, Apply topically 4 (four) times daily., Disp: , Rfl:  .  escitalopram (LEXAPRO) 10 MG tablet, Take 10 mg by mouth daily., Disp: , Rfl:  .  fluticasone (FLONASE) 50 MCG/ACT nasal spray, Place  into both nostrils as needed for allergies or rhinitis., Disp: , Rfl:  .  fluticasone furoate-vilanterol (BREO ELLIPTA) 100-25 MCG/INH AEPB, Inhale 1 puff into the lungs daily., Disp: , Rfl:  .  hydrochlorothiazide (MICROZIDE) 12.5 MG capsule, Take 12.5 mg by mouth daily., Disp: , Rfl:  .  hydroxychloroquine (PLAQUENIL) 200 MG tablet, Take 400 mg by mouth daily., Disp: , Rfl:  .  levothyroxine (SYNTHROID) 150 MCG tablet, Take 1 tablet (150 mcg total) by mouth every other day., Disp: 90 tablet, Rfl: 1 .  levothyroxine (SYNTHROID) 175 MCG tablet, Take 175 mcg by mouth every other day., Disp: , Rfl:  .  pantoprazole (PROTONIX) 40 MG tablet, Take 40 mg by mouth as needed., Disp: , Rfl:  .  pravastatin (PRAVACHOL) 20 MG tablet, Take 1  tablet (20 mg total) by mouth daily., Disp: 90 tablet, Rfl: 1 .  Cephalexin (KEFLEX PO), Take 100 mg by mouth. Prior to dental procedure, Disp: , Rfl:   Allergies  Allergen Reactions  . Lovenox [Enoxaparin Sodium]     rash      ROS: See pertinent positives and negatives per HPI. She denies HA, vision changes, dizziness, CP, SOB, n/v/d/c, URI symptoms, numbness or tingling.    EXAM:  VITALS per patient if applicable: BP [id number]   Pulse 67   Ht 5' 6.5" (1.689 m)   Wt 217 lb (98.4 kg)   BMI 34.50 kg/m    GENERAL: alert, oriented, appears well and in no acute distress  HEENT: atraumatic, conjunctiva clear, no obvious abnormalities on inspection of external nose and ears  NECK: normal movements of the head and neck  LUNGS: on inspection no signs of respiratory distress, breathing rate appears normal, no obvious gross SOB, gasping or wheezing, no conversational dyspnea  CV: no obvious cyanosis  MS: moves all visible extremities without noticeable abnormality  PSYCH/NEURO: pleasant and cooperative, no obvious depression or anxiety, speech and thought processing grossly intact   ASSESSMENT AND PLAN: 1. Dyslipidemia - stable, cont with low fat diet and regular exercise Refill: - pravastatin (PRAVACHOL) 20 MG tablet; Take 1 tablet (20 mg total) by mouth daily.  Dispense: 90 tablet; Refill: 3 - FLP in 70mo at next f/u appt  2. Essential hypertension - controlled, at goal Refill: - amLODipine-olmesartan (AZOR) 5-40 MG tablet; Take 1 tablet by mouth daily.  Dispense: 90 tablet; Refill: 3 - BMP at next OV in 6 mo  3. Postoperative hypothyroidism - stable Refill: - levothyroxine (SYNTHROID) 150 MCG tablet; Take 1 tablet (150 mcg total) by mouth every other day.  Dispense: 90 tablet; Refill: 3 - levothyroxine (SYNTHROID) 175 MCG tablet; Take 1 tablet (175 mcg total) by mouth every other day.  Dispense: 90 tablet; Refill: 3 - TFTs at next OV in 6 mo  4. Depression, major,  single episode, complete remission ([place]) - very well controlled - PHQ-9 = 0, GAD-7 = 0 Refill: - escitalopram (LEXAPRO) 10 MG tablet; Take 1 tablet (10 mg total) by mouth daily.  Dispense: 90 tablet; Refill: 3 - f/u in 6-12 mo  I discussed the assessment and treatment plan with the patient. The patient was provided an opportunity to ask questions and all were answered. The patient agreed with the plan and demonstrated an understanding of the instructions.   The patient was advised to call back or seek an in-person evaluation if the symptoms worsen or if the condition fails to improve as anticipated.   Letta Median, DO

## 2019-09-12 DIAGNOSIS — M9903 Segmental and somatic dysfunction of lumbar region: Secondary | ICD-10-CM | POA: Diagnosis not present

## 2019-09-12 DIAGNOSIS — M9901 Segmental and somatic dysfunction of cervical region: Secondary | ICD-10-CM | POA: Diagnosis not present

## 2019-09-12 DIAGNOSIS — M5032 Other cervical disc degeneration, mid-cervical region, unspecified level: Secondary | ICD-10-CM | POA: Diagnosis not present

## 2019-09-12 DIAGNOSIS — M545 Low back pain: Secondary | ICD-10-CM | POA: Diagnosis not present

## 2019-09-16 ENCOUNTER — Encounter: Payer: Self-pay | Admitting: Family Medicine

## 2019-09-17 DIAGNOSIS — M545 Low back pain: Secondary | ICD-10-CM | POA: Diagnosis not present

## 2019-09-17 DIAGNOSIS — M5032 Other cervical disc degeneration, mid-cervical region, unspecified level: Secondary | ICD-10-CM | POA: Diagnosis not present

## 2019-09-17 DIAGNOSIS — M9901 Segmental and somatic dysfunction of cervical region: Secondary | ICD-10-CM | POA: Diagnosis not present

## 2019-09-17 DIAGNOSIS — M9903 Segmental and somatic dysfunction of lumbar region: Secondary | ICD-10-CM | POA: Diagnosis not present

## 2019-09-18 MED ORDER — BREO ELLIPTA 100-25 MCG/INH IN AEPB: 1.0000 | INHALATION_SPRAY | Freq: Every day | RESPIRATORY_TRACT | 1 refills | Status: DC

## 2019-09-19 DIAGNOSIS — M545 Low back pain: Secondary | ICD-10-CM | POA: Diagnosis not present

## 2019-09-19 DIAGNOSIS — M5032 Other cervical disc degeneration, mid-cervical region, unspecified level: Secondary | ICD-10-CM | POA: Diagnosis not present

## 2019-09-19 DIAGNOSIS — M9901 Segmental and somatic dysfunction of cervical region: Secondary | ICD-10-CM | POA: Diagnosis not present

## 2019-09-19 DIAGNOSIS — M9903 Segmental and somatic dysfunction of lumbar region: Secondary | ICD-10-CM | POA: Diagnosis not present

## 2019-09-24 DIAGNOSIS — M9901 Segmental and somatic dysfunction of cervical region: Secondary | ICD-10-CM | POA: Diagnosis not present

## 2019-09-24 DIAGNOSIS — M545 Low back pain: Secondary | ICD-10-CM | POA: Diagnosis not present

## 2019-09-24 DIAGNOSIS — M5032 Other cervical disc degeneration, mid-cervical region, unspecified level: Secondary | ICD-10-CM | POA: Diagnosis not present

## 2019-09-24 DIAGNOSIS — M9903 Segmental and somatic dysfunction of lumbar region: Secondary | ICD-10-CM | POA: Diagnosis not present

## 2019-09-25 NOTE — Progress Notes (Signed)
Virtual Visit via Video Note  I connected with Mercedes Hodge on 09/27/19 at  9:00 AM EST by a video enabled telemedicine application and verified that I am speaking with the correct person using two identifiers.  Location: Patient: Home  Provider: Clinic  This service was conducted via virtual visit.  Both audio and visual tools were used.  The patient was located at home. I was located in my office.  Consent was obtained prior to the virtual visit and is aware of possible charges through their insurance for this visit.  The patient is an established patient.  Dr. Estanislado Pandy, MD conducted the virtual visit and Hazel Sams, PA-C acted as scribe during the service.  Office staff helped with scheduling follow up visits after the service was conducted.     I discussed the limitations of evaluation and management by telemedicine and the availability of in person appointments. The patient expressed understanding and agreed to proceed.  CC: Left CMC joint pain  History of Present Illness: Patient is a 64 year old female with past medical history of seronegative rheumatoid arthritis and osteoarthritis.  She is having increased pain in the left CMC joint.  She denies any joint swelling at this time.  She has been crafting recently, which has exacerbated the discomfort. She also goes golfing on occasion.  She denies any other joint pain or joint swelling.   Review of Systems  Constitutional: Negative for fever and malaise/fatigue.  HENT: Positive for congestion.   Eyes: Negative for photophobia, pain, discharge and redness.  Respiratory: Negative for cough, shortness of breath and wheezing.   Cardiovascular: Negative for chest pain, palpitations and leg swelling.  Gastrointestinal: Negative for blood in stool, constipation and diarrhea.  Genitourinary: Negative for dysuria and frequency.  Musculoskeletal: Positive for joint pain. Negative for back pain, myalgias and neck pain.  Skin: Negative for  rash.  Neurological: Negative for dizziness and headaches.  Psychiatric/Behavioral: Negative for depression and memory loss. The patient is not nervous/anxious and does not have insomnia.       Observations/Objective: Physical Exam  Constitutional: She is oriented to person, place, and time and well-developed, well-nourished, and in no distress.  HENT:  Head: Normocephalic and atraumatic.  Eyes: Conjunctivae are normal.  Pulmonary/Chest: Effort normal.  Neurological: She is alert and oriented to person, place, and time.  Psychiatric: Mood, memory, affect and judgment normal.   Patient reports morning stiffness for 10 minutes.   Patient denies nocturnal pain.  Difficulty dressing/grooming: Denies Difficulty climbing stairs: Reports Difficulty getting out of chair: Denies Difficulty using hands for taps, buttons, cutlery, and/or writing: Reports  July 05, 2019 CBC normal, CMP normal, ESR 6, RF negative, anti-CCP negative, '14 3 3 ' eta negative  Assessment and Plan: Visit Diagnoses: Rheumatoid arthritis of multiple sites with negative rheumatoid factor (HCC) - Positive anti-CCP (repeat negative) and elevated CRP, Diagnosed in [place] by her rheumatologist. Hx of inflammatory arthritis.  She was placed on Plaquenil in November 2019. X-rays were consistent with rheumatoid arthritis and osteoarthritis overlap.  Possible erosion was noted in the right ulnar styloid: She has Anti-CCP negative, 14-3-3 eta negative, RF-, and ESR negative on 07/05/19.  Labs were reviewed and all questions were addressed. She continues to have pain in the left [facility] joint, which was exacerbated by crafting recently.  She has no joint inflammation at this time.  She is clinically doing well on Plaquenil 400 mg po daily. She will continue on this current regimen.  She was advised to  notify us if she develops increased joint pain or joint swelling.  She will follow up in 4 months.  High risk medication use - PLQ 400  mg p.o. daily.  Last eye exam was in March 2020 per patient in [place]. CBC and CMP were drawn on 07/05/19.  She is due to update lab work in March 2021 and every 5 months.   Primary osteoarthritis of both hands- DIP and CMC thickening.  She is experiencing left CMC joint pain currently.  No inflammation at this time.  She has been crafting recently, which has exacerbated her discomfort. She has been using diclofenac topically as needed for pain relief. We discussed the importance of joint protection and muscle strengthening.   Hand exercises were discussed and demonstrated. She was also encouraged to purchase a CMC joint brace for support.   Status post total bilateral knee replacement-Doing well.  She has occasional discomfort and some stiffness.  Status post right ankle joint replacement-History of Charcot joint which required total ankle replacement.  She has chronic pain.   Osteopenia of multiple sites-DEXA on April 2019 which showed a T score of -1.1.  Other medical conditions are listed as follows:   Essential hypertension-she is on medications.  History of asthma-recent diagnosis.  History of thyroid cancer  Postoperative hypothyroidism  Dyslipidemia-she is on statins.  Gastroesophageal reflux disease without esophagitis-She takes Protonix on as needed basis.     Follow Up Instructions: She will follow up in 4 months    I discussed the assessment and treatment plan with the patient. The patient was provided an opportunity to ask questions and all were answered. The patient agreed with the plan and demonstrated an understanding of the instructions.   The patient was advised to call back or seek an in-person evaluation if the symptoms worsen or if the condition fails to improve as anticipated.  I provided 25 minutes of non-face-to-face time during this encounter.   Bo Merino, MD   Scribed by-  Hazel Sams, PA-C

## 2019-09-26 DIAGNOSIS — M9903 Segmental and somatic dysfunction of lumbar region: Secondary | ICD-10-CM | POA: Diagnosis not present

## 2019-09-26 DIAGNOSIS — M545 Low back pain: Secondary | ICD-10-CM | POA: Diagnosis not present

## 2019-09-26 DIAGNOSIS — M9901 Segmental and somatic dysfunction of cervical region: Secondary | ICD-10-CM | POA: Diagnosis not present

## 2019-09-26 DIAGNOSIS — M5032 Other cervical disc degeneration, mid-cervical region, unspecified level: Secondary | ICD-10-CM | POA: Diagnosis not present

## 2019-09-27 ENCOUNTER — Other Ambulatory Visit: Payer: Self-pay

## 2019-09-27 ENCOUNTER — Telehealth (INDEPENDENT_AMBULATORY_CARE_PROVIDER_SITE_OTHER): Payer: Medicare HMO | Admitting: Rheumatology

## 2019-09-27 ENCOUNTER — Encounter: Payer: Self-pay | Admitting: Rheumatology

## 2019-09-27 DIAGNOSIS — Z79899 Other long term (current) drug therapy: Secondary | ICD-10-CM | POA: Diagnosis not present

## 2019-09-27 DIAGNOSIS — Z8709 Personal history of other diseases of the respiratory system: Secondary | ICD-10-CM

## 2019-09-27 DIAGNOSIS — K219 Gastro-esophageal reflux disease without esophagitis: Secondary | ICD-10-CM

## 2019-09-27 DIAGNOSIS — M19041 Primary osteoarthritis, right hand: Secondary | ICD-10-CM | POA: Diagnosis not present

## 2019-09-27 DIAGNOSIS — E785 Hyperlipidemia, unspecified: Secondary | ICD-10-CM

## 2019-09-27 DIAGNOSIS — M8589 Other specified disorders of bone density and structure, multiple sites: Secondary | ICD-10-CM | POA: Diagnosis not present

## 2019-09-27 DIAGNOSIS — M0609 Rheumatoid arthritis without rheumatoid factor, multiple sites: Secondary | ICD-10-CM

## 2019-09-27 DIAGNOSIS — Z96661 Presence of right artificial ankle joint: Secondary | ICD-10-CM | POA: Diagnosis not present

## 2019-09-27 DIAGNOSIS — M19042 Primary osteoarthritis, left hand: Secondary | ICD-10-CM

## 2019-09-27 DIAGNOSIS — Z85850 Personal history of malignant neoplasm of thyroid: Secondary | ICD-10-CM | POA: Diagnosis not present

## 2019-09-27 DIAGNOSIS — I10 Essential (primary) hypertension: Secondary | ICD-10-CM

## 2019-09-27 DIAGNOSIS — Z96653 Presence of artificial knee joint, bilateral: Secondary | ICD-10-CM

## 2019-10-01 ENCOUNTER — Ambulatory Visit: Payer: Medicare HMO | Admitting: Rheumatology

## 2019-10-01 DIAGNOSIS — M545 Low back pain: Secondary | ICD-10-CM | POA: Diagnosis not present

## 2019-10-01 DIAGNOSIS — M9903 Segmental and somatic dysfunction of lumbar region: Secondary | ICD-10-CM | POA: Diagnosis not present

## 2019-10-01 DIAGNOSIS — M5032 Other cervical disc degeneration, mid-cervical region, unspecified level: Secondary | ICD-10-CM | POA: Diagnosis not present

## 2019-10-01 DIAGNOSIS — M9901 Segmental and somatic dysfunction of cervical region: Secondary | ICD-10-CM | POA: Diagnosis not present

## 2019-10-03 DIAGNOSIS — M9903 Segmental and somatic dysfunction of lumbar region: Secondary | ICD-10-CM | POA: Diagnosis not present

## 2019-10-03 DIAGNOSIS — M5032 Other cervical disc degeneration, mid-cervical region, unspecified level: Secondary | ICD-10-CM | POA: Diagnosis not present

## 2019-10-03 DIAGNOSIS — M9901 Segmental and somatic dysfunction of cervical region: Secondary | ICD-10-CM | POA: Diagnosis not present

## 2019-10-03 DIAGNOSIS — M545 Low back pain: Secondary | ICD-10-CM | POA: Diagnosis not present

## 2019-10-09 ENCOUNTER — Encounter: Payer: Self-pay | Admitting: Family Medicine

## 2019-10-10 DIAGNOSIS — M5032 Other cervical disc degeneration, mid-cervical region, unspecified level: Secondary | ICD-10-CM | POA: Diagnosis not present

## 2019-10-10 DIAGNOSIS — M9903 Segmental and somatic dysfunction of lumbar region: Secondary | ICD-10-CM | POA: Diagnosis not present

## 2019-10-10 DIAGNOSIS — M9901 Segmental and somatic dysfunction of cervical region: Secondary | ICD-10-CM | POA: Diagnosis not present

## 2019-10-10 DIAGNOSIS — M545 Low back pain: Secondary | ICD-10-CM | POA: Diagnosis not present

## 2019-10-17 DIAGNOSIS — M545 Low back pain: Secondary | ICD-10-CM | POA: Diagnosis not present

## 2019-10-17 DIAGNOSIS — M5032 Other cervical disc degeneration, mid-cervical region, unspecified level: Secondary | ICD-10-CM | POA: Diagnosis not present

## 2019-10-17 DIAGNOSIS — M9901 Segmental and somatic dysfunction of cervical region: Secondary | ICD-10-CM | POA: Diagnosis not present

## 2019-10-17 DIAGNOSIS — M9903 Segmental and somatic dysfunction of lumbar region: Secondary | ICD-10-CM | POA: Diagnosis not present

## 2019-10-24 DIAGNOSIS — M9903 Segmental and somatic dysfunction of lumbar region: Secondary | ICD-10-CM | POA: Diagnosis not present

## 2019-10-24 DIAGNOSIS — M9901 Segmental and somatic dysfunction of cervical region: Secondary | ICD-10-CM | POA: Diagnosis not present

## 2019-10-24 DIAGNOSIS — M545 Low back pain: Secondary | ICD-10-CM | POA: Diagnosis not present

## 2019-10-24 DIAGNOSIS — M5032 Other cervical disc degeneration, mid-cervical region, unspecified level: Secondary | ICD-10-CM | POA: Diagnosis not present

## 2019-11-04 DIAGNOSIS — M545 Low back pain: Secondary | ICD-10-CM | POA: Diagnosis not present

## 2019-11-04 DIAGNOSIS — M9901 Segmental and somatic dysfunction of cervical region: Secondary | ICD-10-CM | POA: Diagnosis not present

## 2019-11-04 DIAGNOSIS — M9903 Segmental and somatic dysfunction of lumbar region: Secondary | ICD-10-CM | POA: Diagnosis not present

## 2019-11-04 DIAGNOSIS — M5032 Other cervical disc degeneration, mid-cervical region, unspecified level: Secondary | ICD-10-CM | POA: Diagnosis not present

## 2019-11-05 ENCOUNTER — Other Ambulatory Visit: Payer: Self-pay | Admitting: *Deleted

## 2019-11-05 MED ORDER — HYDROXYCHLOROQUINE SULFATE 200 MG PO TABS: 400.0000 mg | ORAL_TABLET | Freq: Every day | ORAL | 0 refills | Status: DC

## 2019-11-05 NOTE — Telephone Encounter (Signed)
Refill request received via fax  Last Visit: 09/27/19 Next Visit: 01/27/20 Labs: 07/04/20 CBC and CMP WNL. Eye exam:  normal Feburary 2020 per patient.  Okay to refill per Dr. Estanislado Pandy

## 2019-11-07 ENCOUNTER — Telehealth: Payer: Self-pay | Admitting: Rheumatology

## 2019-11-07 NOTE — Telephone Encounter (Signed)
Patient request a refill on Plaquenil sent to [facility].

## 2019-11-07 NOTE — Telephone Encounter (Signed)
Patient advised prescription has been sent to the pharmacy on 11/05/19.

## 2019-11-14 DIAGNOSIS — M5032 Other cervical disc degeneration, mid-cervical region, unspecified level: Secondary | ICD-10-CM | POA: Diagnosis not present

## 2019-11-14 DIAGNOSIS — M9903 Segmental and somatic dysfunction of lumbar region: Secondary | ICD-10-CM | POA: Diagnosis not present

## 2019-11-14 DIAGNOSIS — M9901 Segmental and somatic dysfunction of cervical region: Secondary | ICD-10-CM | POA: Diagnosis not present

## 2019-11-14 DIAGNOSIS — M545 Low back pain: Secondary | ICD-10-CM | POA: Diagnosis not present

## 2019-11-21 DIAGNOSIS — M545 Low back pain: Secondary | ICD-10-CM | POA: Diagnosis not present

## 2019-11-21 DIAGNOSIS — M9901 Segmental and somatic dysfunction of cervical region: Secondary | ICD-10-CM | POA: Diagnosis not present

## 2019-11-21 DIAGNOSIS — M5032 Other cervical disc degeneration, mid-cervical region, unspecified level: Secondary | ICD-10-CM | POA: Diagnosis not present

## 2019-11-21 DIAGNOSIS — M9903 Segmental and somatic dysfunction of lumbar region: Secondary | ICD-10-CM | POA: Diagnosis not present

## 2019-11-28 ENCOUNTER — Other Ambulatory Visit: Payer: Self-pay | Admitting: Rheumatology

## 2019-11-28 DIAGNOSIS — M9901 Segmental and somatic dysfunction of cervical region: Secondary | ICD-10-CM | POA: Diagnosis not present

## 2019-11-28 DIAGNOSIS — M9903 Segmental and somatic dysfunction of lumbar region: Secondary | ICD-10-CM | POA: Diagnosis not present

## 2019-11-28 DIAGNOSIS — M5032 Other cervical disc degeneration, mid-cervical region, unspecified level: Secondary | ICD-10-CM | POA: Diagnosis not present

## 2019-11-28 DIAGNOSIS — M545 Low back pain: Secondary | ICD-10-CM | POA: Diagnosis not present

## 2019-11-28 NOTE — Telephone Encounter (Signed)
Last Visit: 09/27/19 Next Visit: 01/27/20 Labs: 07/04/20 CBC and CMP WNL. Eye exam:  normal Feburary 2020 per patient.  Patient advised we need updated PLQ eye exam. Patient states she will call her eye doctor will have them send the results to the office.   Okay to refill 30 day supply per Dr. Estanislado Pandy

## 2019-12-05 DIAGNOSIS — M9901 Segmental and somatic dysfunction of cervical region: Secondary | ICD-10-CM | POA: Diagnosis not present

## 2019-12-05 DIAGNOSIS — M5032 Other cervical disc degeneration, mid-cervical region, unspecified level: Secondary | ICD-10-CM | POA: Diagnosis not present

## 2019-12-05 DIAGNOSIS — M545 Low back pain: Secondary | ICD-10-CM | POA: Diagnosis not present

## 2019-12-05 DIAGNOSIS — M9903 Segmental and somatic dysfunction of lumbar region: Secondary | ICD-10-CM | POA: Diagnosis not present

## 2019-12-12 DIAGNOSIS — M545 Low back pain: Secondary | ICD-10-CM | POA: Diagnosis not present

## 2019-12-12 DIAGNOSIS — M9903 Segmental and somatic dysfunction of lumbar region: Secondary | ICD-10-CM | POA: Diagnosis not present

## 2019-12-12 DIAGNOSIS — M9901 Segmental and somatic dysfunction of cervical region: Secondary | ICD-10-CM | POA: Diagnosis not present

## 2019-12-12 DIAGNOSIS — M5032 Other cervical disc degeneration, mid-cervical region, unspecified level: Secondary | ICD-10-CM | POA: Diagnosis not present

## 2019-12-17 ENCOUNTER — Telehealth: Payer: Self-pay | Admitting: Family Medicine

## 2019-12-17 DIAGNOSIS — I10 Essential (primary) hypertension: Secondary | ICD-10-CM

## 2019-12-17 DIAGNOSIS — E785 Hyperlipidemia, unspecified: Secondary | ICD-10-CM

## 2019-12-17 NOTE — Progress Notes (Signed)
  Chronic Care Management   Note  12/17/2019 Name: Takarra Gagel MRN: [id number] DOB: 1956/05/19  Treyanna Toren is a 64 y.o. year old female who is a primary care patient of Ronnald Nian, DO. I reached out to [name] by phone today in response to a referral sent by Ms. Coralyn Mark Lao's PCP, Ronnald Nian, DO.   Ms. Cantalupo was given information about Chronic Care Management services today including:  1. CCM service includes personalized support from designated clinical staff supervised by her physician, including individualized plan of care and coordination with other care providers 2. 24/7 contact phone numbers for assistance for urgent and routine care needs. 3. Service will only be billed when office clinical staff spend 20 minutes or more in a month to coordinate care. 4. Only one practitioner may furnish and bill the service in a calendar month. 5. The patient may stop CCM services at any time (effective at the end of the month) by phone call to the office staff.   Patient agreed to services and verbal consent obtained.   Follow up plan:   Earney Hamburg [employer] Scheduler

## 2019-12-19 DIAGNOSIS — M545 Low back pain: Secondary | ICD-10-CM | POA: Diagnosis not present

## 2019-12-19 DIAGNOSIS — M9903 Segmental and somatic dysfunction of lumbar region: Secondary | ICD-10-CM | POA: Diagnosis not present

## 2019-12-19 DIAGNOSIS — M5032 Other cervical disc degeneration, mid-cervical region, unspecified level: Secondary | ICD-10-CM | POA: Diagnosis not present

## 2019-12-19 DIAGNOSIS — M9901 Segmental and somatic dysfunction of cervical region: Secondary | ICD-10-CM | POA: Diagnosis not present

## 2019-12-19 NOTE — Addendum Note (Signed)
Addended by: Darral Dash on: 12/19/2019 09:43 AM   Modules accepted: Orders

## 2019-12-20 NOTE — Chronic Care Management (AMB) (Signed)
Chronic Care Management Pharmacy  Name: Mercedes Hodge  MRN: [id number] DOB: 03/29/1956  Chief Complaint/ HPI  [name],  64 y.o. , female presents for their Initial CCM visit with the clinical pharmacist via telephone.  PCP : Ronnald Nian, DO  Their chronic conditions include: hypertension, asthma, dyslipidemia, rheumatoid arthritis, hypothyroidism, GERD  Office Visits: 09/11/19: Video visit with Dr. Bryan Lemma for follow-up. Patient stable, doing well. No medication changes made.   Consult Visit: 09/27/19: Video Visit with Dr. Bo Merino (rehumatology) for rheumatoid arthritis follow-up. Patient with joint pain in left hand, otherwise doing well. Follow-up in 4 months   Medications: Outpatient Encounter Medications as of 12/23/2019  Medication Sig  . amLODipine-olmesartan (AZOR) 5-40 MG tablet Take 1 tablet by mouth daily.  . calcium carbonate (TUMS - DOSED IN MG ELEMENTAL CALCIUM) 500 MG chewable tablet Chew 1 tablet by mouth daily.  . Cholecalciferol (VITAMIN D) 50 MCG (2000 UT) tablet Take 2,000 Units by mouth daily.  Marland Kitchen conjugated estrogens (PREMARIN) vaginal cream 1 applicatorful twice per week  . diclofenac sodium (VOLTAREN) 1 % GEL Apply topically 4 (four) times daily.  Marland Kitchen escitalopram (LEXAPRO) 10 MG tablet Take 1 tablet (10 mg total) by mouth daily.  . Famotidine (PEPCID AC MAXIMUM STRENGTH) 20 MG CHEW Chew 1 tablet by mouth 2 (two) times daily as needed.  . fluticasone (FLONASE) 50 MCG/ACT nasal spray Place into both nostrils as needed for allergies or rhinitis.  . fluticasone furoate-vilanterol (BREO ELLIPTA) 100-25 MCG/INH AEPB Inhale 1 puff into the lungs daily.  . hydrochlorothiazide (MICROZIDE) 12.5 MG capsule Take 12.5 mg by mouth daily.  . hydroxychloroquine (PLAQUENIL) 200 MG tablet TAKE 2 TABLETS (400 MG TOTAL) BY MOUTH DAILY.  Marland Kitchen levothyroxine (SYNTHROID) 150 MCG tablet Take 1 tablet (150 mcg total) by mouth every other day.  . levothyroxine  (SYNTHROID) 175 MCG tablet Take 1 tablet (175 mcg total) by mouth every other day.  . pravastatin (PRAVACHOL) 20 MG tablet Take 1 tablet (20 mg total) by mouth daily.  . Cephalexin (KEFLEX PO) Take 100 mg by mouth. Prior to dental procedure  . chlorhexidine (PERIDEX) 0.12 % solution   . pantoprazole (PROTONIX) 40 MG tablet Take 40 mg by mouth as needed.   No facility-administered encounter medications on file as of 12/23/2019.     Current Diagnosis/Assessment:  Goals Addressed            This Visit's Progress   . Chronic Care Management.       CARE PLAN ENTRY  Current Barriers:  . Chronic Disease Management support, education, and care coordination needs related to  hypertension, asthma, dyslipidemia, rheumatoid arthritis, hypothyroidism, GERD  Clinical Goal(s): Over the next 180 days, patient will:  . Work with the care management team to address educational, disease management, and care coordination needs  . Begin or continue self health monitoring activities as directed today  . Call provider office for new or worsened signs and symptoms  . Call care management team with questions or concerns . Maintain blood pressure less than 130/80 . Maintain LDL (bad cholesterol) less than 100   Interventions:  . Evaluation of current treatment plans and patient's adherence to plan as established by provider . Assessed patient understanding of disease states . Assessed patient's education and care coordination needs . Provided disease specific education to patient   Telephone follow up appointment with pharmacy team member scheduled for: 06/22/20 at 10:30 AM         Asthma  Eosinophil count:   Lab Results  Component Value Date/Time   EOSPCT 2.6 07/05/2019 02:41 PM  %                               Eos (Absolute):  Lab Results  Component Value Date/Time   EOSABS 208 07/05/2019 02:41 PM    Tobacco Status:  Social History   Tobacco Use  Smoking Status Never Smoker    Smokeless Tobacco Never Used    Patient has failed these meds in past: n/a Patient is currently controlled on the following medications:   Breo Ellipta 100-25 mcg/inh 1 puff daily   Using maintenance inhaler regularly? Yes Frequency of rescue inhaler use:  never  We discussed:  proper inhaler technique. Patient states breathing significantly improved since she started the Breo inhaler and she denies any instances of shortness of breath or wheezing.  Plan  Continue current medications   Hypertension   BP today is:  <130/80  Office blood pressures are  BP Readings from Last 3 Encounters:  09/11/19 132/80  07/02/19 124/78  06/07/19 126/82   CMP Latest Ref Rng & Units 07/05/2019 10/17/2016  Glucose 65 - 99 mg/dL 93 -  BUN 7 - 25 mg/dL 20 16  Creatinine 0.50 - 0.99 mg/dL 0.85 0.6  Sodium 135 - 146 mmol/L 137 139  Potassium 3.5 - 5.3 mmol/L 3.8 4.5  Chloride 98 - 110 mmol/L 101 -  CO2 20 - 32 mmol/L 28 -  Calcium 8.6 - 10.4 mg/dL 9.0 -  Total Protein 6.1 - 8.1 g/dL 6.7 -  Total Bilirubin 0.2 - 1.2 mg/dL 0.6 -  AST 10 - 35 U/L 21 -  ALT 6 - 29 U/L 14 22      Patient has failed these meds in the past: n/a Patient is currently controlled on the following medications:  Amlodipine-olmesartan 5-40 mg daily ($150 per 3 month supply)   HCTZ 12.5 mg daily   Patient checks BP at home 1-2x per week  Patient home BP readings are ranging: 120/72, 108/60   We discussed diet and exercise extensively Does not snack often. Gastric sleeve in 2019. Does salt food, but otherwise low-salt sodium. Trying to increase distance she walking. 3-4 times weekly. Has stationary bike, uses sporadically. Thinking about joining a local gym, but has been hesitant because of Covid-19.  Plan  Recommend switching amlodipine-olmesartan 5-40 mg daily to amlodipine-benazapril 5-40 mg daily as this is a Tier 1 medication under her insurance and would save her a significant amount of money.    Hyperlipidemia   Lipid Panel     Component Value Date/Time   LDLCALC 103 10/17/2016 0000     The ASCVD Risk score Mikey Bussing DC Jr., et al., 2013) failed to calculate for the following reasons:   Cannot find a previous HDL lab   Cannot find a previous total cholesterol lab   Patient has failed these meds in past: n/a Patient is currently controlled on the following medications:   Pravastatin 20 mg daily  LDL goal <100  We discussed:  diet and exercise extensively  Plan  Continue current medications  Recommend fasting lipid panel  Hypothyroidism   History of thyroid cancer. 05/04/15 (Care Everywhere):  TSH 0.16 T4 1.31   Patient has failed these meds in past: n/a Patient is currently controlled on the following medications:   Levothyroxine 150 mcg every other day   Levothyroxine 175 mcg every other  day   We discussed:  diet and exercise extensively  Plan  Continue current medications Recommend follow-up TSH   Rheumatoid Arthritis    Managed by Dr. Bo Merino  CBC Latest Ref Rng & Units 07/05/2019  WBC 3.8 - 10.8 Thousand/uL 8.0  Hemoglobin 11.7 - 15.5 g/dL 13.8  Hematocrit 35.0 - 45.0 % 40.9  Platelets 140 - 400 Thousand/uL 204  ESR (07/05/19): 6 Last eye exam: March 2020  Patient has failed these meds in past: n/a Patient is currently controlled on the following medications:   Hydroxychloroquine 400 mg daily    We discussed:  Patient feels hydroxychloroquine effectively managing her pain. She occasionally still has pain in hand when she has been using her hands (craft projects). Voltaren gel helps relieve her symptoms when she has breakthrough pain.  Plan  Continue current medications  Depression   (09/11/19): PHQ-9 = 0, GAD-7 = 0  Patient has failed these meds in past: n/a Patient is currently controlled on the following medications:   Escitalopram 10 mg daily   We discussed:    Plan  Continue current medications  GERD   Patient has  failed these meds in past: n/a Patient is currently controlled on the following medications:   Pantoprazole 40 mg daily PRN (hasn't used in 12 months)  Pepcid 20 mg PRN (uses once per 3 weeks)  We discussed:  diet and exercise extensively. Rarely has reflux symptoms, typically if only eating spicy or acidic foods ([place])  Plan  Continue current medications   Misc/OTC   Vitamin D3 2000 units daily  Premarin vaginal cream  Voltaren 1% gel (uses 1x weekly) Flonase 50 mcg/spray PRN (using 2 sprays daily)   Plan  Continue current medications  Vaccines   Reviewed and discussed patient's vaccination history.    Immunization History  Administered Date(s) Administered  . Influenza-Unspecified 05/22/2019  . Pneumococcal Conjugate-13 06/07/2019  . Tdap 06/07/2019    Plan  Received second dose of Pfizer 12/08/19   Medication Management   Pt uses [facility] mail order pharmacy for all medications. Uses pill box which she fills herself.    Follow up: 6 months   [name] [phone]

## 2019-12-23 ENCOUNTER — Ambulatory Visit: Payer: Medicare HMO

## 2019-12-23 DIAGNOSIS — I10 Essential (primary) hypertension: Secondary | ICD-10-CM

## 2019-12-23 DIAGNOSIS — E785 Hyperlipidemia, unspecified: Secondary | ICD-10-CM

## 2019-12-23 NOTE — Patient Instructions (Addendum)
Visit Information It was great speaking with you today!  Please let me know if you have any questions about our visit.  Goals Addressed            This Visit's Progress   . Chronic Care Management.       CARE PLAN ENTRY  Current Barriers:  . Chronic Disease Management support, education, and care coordination needs related to  hypertension, asthma, dyslipidemia, rheumatoid arthritis, hypothyroidism, GERD  Clinical Goal(s): Over the next 180 days, patient will:  . Work with the care management team to address educational, disease management, and care coordination needs  . Begin or continue self health monitoring activities as directed today  . Call provider office for new or worsened signs and symptoms  . Call care management team with questions or concerns . Maintain blood pressure less than 130/80 . Maintain LDL (bad cholesterol) less than 100   Interventions:  . Evaluation of current treatment plans and patient's adherence to plan as established by provider . Assessed patient understanding of disease states . Assessed patient's education and care coordination needs . Provided disease specific education to patient   Telephone follow up appointment with pharmacy team member scheduled for: 06/22/20 at 10:30 AM         Mercedes Hodge was given information about Chronic Care Management services today including:  1. CCM service includes personalized support from designated clinical staff supervised by her physician, including individualized plan of care and coordination with other care providers 2. 24/7 contact phone numbers for assistance for urgent and routine care needs. 3. Standard insurance, coinsurance, copays and deductibles apply for chronic care management only during months in which we provide at least 20 minutes of these services. Most insurances cover these services at 100%, however patients may be responsible for any copay, coinsurance and/or deductible if applicable.  This service may help you avoid the need for more expensive face-to-face services. 4. Only one practitioner may furnish and bill the service in a calendar month. 5. The patient may stop CCM services at any time (effective at the end of the month) by phone call to the office staff.  Patient agreed to services and verbal consent obtained.   The patient verbalized understanding of instructions provided today and agreed to receive a mailed copy of patient instruction and/or educational materials. Telephone follow up appointment with pharmacy team member scheduled for: 06/22/20  Mercedes Hodge [id number]  DASH Eating Plan DASH stands for "Dietary Approaches to Stop Hypertension." The DASH eating plan is a healthy eating plan that has been shown to reduce high blood pressure (hypertension). It may also reduce your risk for type 2 diabetes, heart disease, and stroke. The DASH eating plan may also help with weight loss. What are tips for following this plan?  General guidelines  Avoid eating more than 2,300 mg (milligrams) of salt (sodium) a day. If you have hypertension, you may need to reduce your sodium intake to 1,500 mg a day.  Limit alcohol intake to no more than 1 drink a day for nonpregnant women and 2 drinks a day for men. One drink equals 12 oz of beer, 5 oz of wine, or 1 oz of hard liquor.  Work with your health care provider to maintain a healthy body weight or to lose weight. Ask what an ideal weight is for you.  Get at least 30 minutes of exercise that causes your heart to beat faster (aerobic exercise) most days of the week. Activities may include walking,  swimming, or biking.  Work with your health care provider or diet and nutrition specialist (dietitian) to adjust your eating plan to your individual calorie needs. Reading food labels   Check food labels for the amount of sodium per serving. Choose foods with less than 5 percent of the Daily Value of sodium. Generally,  foods with less than 300 mg of sodium per serving fit into this eating plan.  To find whole grains, look for the word "whole" as the first word in the ingredient list. Shopping  Buy products labeled as "low-sodium" or "no salt added."  Buy fresh foods. Avoid canned foods and premade or frozen meals. Cooking  Avoid adding salt when cooking. Use salt-free seasonings or herbs instead of table salt or sea salt. Check with your health care provider or pharmacist before using salt substitutes.  Do not fry foods. Cook foods using healthy methods such as baking, boiling, grilling, and broiling instead.  Cook with heart-healthy oils, such as olive, canola, soybean, or sunflower oil. Meal planning  Eat a balanced diet that includes: ? 5 or more servings of fruits and vegetables each day. At each meal, try to fill half of your plate with fruits and vegetables. ? Up to 6-8 servings of whole grains each day. ? Less than 6 oz of lean meat, poultry, or fish each day. A 3-oz serving of meat is about the same size as a deck of cards. One egg equals 1 oz. ? 2 servings of low-fat dairy each day. ? A serving of nuts, seeds, or beans 5 times each week. ? Heart-healthy fats. Healthy fats called Omega-3 fatty acids are found in foods such as flaxseeds and coldwater fish, like sardines, salmon, and mackerel.  Limit how much you eat of the following: ? Canned or prepackaged foods. ? Food that is high in trans fat, such as fried foods. ? Food that is high in saturated fat, such as fatty meat. ? Sweets, desserts, sugary drinks, and other foods with added sugar. ? Full-fat dairy products.  Do not salt foods before eating.  Try to eat at least 2 vegetarian meals each week.  Eat more home-cooked food and less restaurant, buffet, and fast food.  When eating at a restaurant, ask that your food be prepared with less salt or no salt, if possible. What foods are recommended? The items listed may not be a  complete list. Talk with your dietitian about what dietary choices are best for you. Grains Whole-grain or whole-wheat bread. Whole-grain or whole-wheat pasta. Brown rice. Mercedes Hodge. Bulgur. Whole-grain and low-sodium cereals. Pita bread. Low-fat, low-sodium crackers. Whole-wheat flour tortillas. Vegetables Fresh or frozen vegetables (raw, steamed, roasted, or grilled). Low-sodium or reduced-sodium tomato and vegetable juice. Low-sodium or reduced-sodium tomato sauce and tomato paste. Low-sodium or reduced-sodium canned vegetables. Fruits All fresh, dried, or frozen fruit. Canned fruit in natural juice (without added sugar). Meat and other protein foods Skinless chicken or [place]. Ground chicken or [place]. Pork with fat trimmed off. Fish and seafood. Egg whites. Dried beans, peas, or lentils. Unsalted nuts, nut butters, and seeds. Unsalted canned beans. Lean cuts of beef with fat trimmed off. Low-sodium, lean deli meat. Dairy Low-fat (1%) or fat-free (skim) milk. Fat-free, low-fat, or reduced-fat cheeses. Nonfat, low-sodium ricotta or cottage cheese. Low-fat or nonfat yogurt. Low-fat, low-sodium cheese. Fats and oils Soft margarine without trans fats. Vegetable oil. Low-fat, reduced-fat, or light mayonnaise and salad dressings (reduced-sodium). Canola, safflower, olive, soybean, and sunflower oils. Avocado. Seasoning and other foods  Herbs. Spices. Seasoning mixes without salt. Unsalted popcorn and pretzels. Fat-free sweets. What foods are not recommended? The items listed may not be a complete list. Talk with your dietitian about what dietary choices are best for you. Grains Baked goods made with fat, such as croissants, muffins, or some breads. Dry pasta or rice meal packs. Vegetables Creamed or fried vegetables. Vegetables in a cheese sauce. Regular canned vegetables (not low-sodium or reduced-sodium). Regular canned tomato sauce and paste (not low-sodium or reduced-sodium). Regular  tomato and vegetable juice (not low-sodium or reduced-sodium). Angie Fava. Olives. Fruits Canned fruit in a light or heavy syrup. Fried fruit. Fruit in cream or butter sauce. Meat and other protein foods Fatty cuts of meat. Ribs. Fried meat. Berniece Salines. Sausage. Bologna and other processed lunch meats. Salami. Fatback. Hotdogs. Bratwurst. Salted nuts and seeds. Canned beans with added salt. Canned or smoked fish. Whole eggs or egg yolks. Chicken or [place] with skin. Dairy Whole or 2% milk, cream, and half-and-half. Whole or full-fat cream cheese. Whole-fat or sweetened yogurt. Full-fat cheese. Nondairy creamers. Whipped toppings. Processed cheese and cheese spreads. Fats and oils Butter. Stick margarine. Lard. Shortening. Ghee. Bacon fat. Tropical oils, such as coconut, palm kernel, or palm oil. Seasoning and other foods Salted popcorn and pretzels. Onion salt, garlic salt, seasoned salt, table salt, and sea salt. Worcestershire sauce. Tartar sauce. Barbecue sauce. Teriyaki sauce. Soy sauce, including reduced-sodium. Steak sauce. Canned and packaged gravies. Fish sauce. Oyster sauce. Cocktail sauce. Horseradish that you find on the shelf. Ketchup. Mustard. Meat flavorings and tenderizers. Bouillon cubes. Hot sauce and Tabasco sauce. Premade or packaged marinades. Premade or packaged taco seasonings. Relishes. Regular salad dressings. Where to find more information:  National Heart, Lung, and [place]: https://wilson-eaton.com/  American Heart Association: www.heart.org Summary  The DASH eating plan is a healthy eating plan that has been shown to reduce high blood pressure (hypertension). It may also reduce your risk for type 2 diabetes, heart disease, and stroke.  With the DASH eating plan, you should limit salt (sodium) intake to 2,300 mg a day. If you have hypertension, you may need to reduce your sodium intake to 1,500 mg a day.  When on the DASH eating plan, aim to eat more fresh fruits and  vegetables, whole grains, lean proteins, low-fat dairy, and heart-healthy fats.  Work with your health care provider or diet and nutrition specialist (dietitian) to adjust your eating plan to your individual calorie needs. This information is not intended to replace advice given to you by your health care provider. Make sure you discuss any questions you have with your health care provider. Document Revised: 08/11/2017 Document Reviewed: 08/22/2016 Elsevier Patient Education  2020 [employer].

## 2019-12-25 ENCOUNTER — Other Ambulatory Visit: Payer: Self-pay | Admitting: Family Medicine

## 2019-12-25 DIAGNOSIS — I10 Essential (primary) hypertension: Secondary | ICD-10-CM

## 2019-12-25 MED ORDER — AMLODIPINE BESY-BENAZEPRIL HCL 5-40 MG PO CAPS: 1.0000 | ORAL_CAPSULE | Freq: Every day | ORAL | 3 refills | Status: DC

## 2019-12-26 DIAGNOSIS — Z79899 Other long term (current) drug therapy: Secondary | ICD-10-CM | POA: Diagnosis not present

## 2019-12-26 DIAGNOSIS — M069 Rheumatoid arthritis, unspecified: Secondary | ICD-10-CM | POA: Diagnosis not present

## 2019-12-26 DIAGNOSIS — H2513 Age-related nuclear cataract, bilateral: Secondary | ICD-10-CM | POA: Diagnosis not present

## 2019-12-26 DIAGNOSIS — H04123 Dry eye syndrome of bilateral lacrimal glands: Secondary | ICD-10-CM | POA: Diagnosis not present

## 2019-12-30 DIAGNOSIS — M9903 Segmental and somatic dysfunction of lumbar region: Secondary | ICD-10-CM | POA: Diagnosis not present

## 2019-12-30 DIAGNOSIS — M545 Low back pain: Secondary | ICD-10-CM | POA: Diagnosis not present

## 2019-12-30 DIAGNOSIS — M9901 Segmental and somatic dysfunction of cervical region: Secondary | ICD-10-CM | POA: Diagnosis not present

## 2019-12-30 DIAGNOSIS — M5032 Other cervical disc degeneration, mid-cervical region, unspecified level: Secondary | ICD-10-CM | POA: Diagnosis not present

## 2019-12-31 ENCOUNTER — Other Ambulatory Visit: Payer: Self-pay | Admitting: Rheumatology

## 2019-12-31 NOTE — Telephone Encounter (Signed)
Last Visit: 09/27/2019 telemedicine  Next Visit: 01/27/2020 Labs: 07/05/2019 CBC and CMP WNL. RF, anti-CCP negative, 14-3-3 eta negative, sed rate WNL. Eye exam: 12/26/2019   Attempted to contact patient and left message on machine to advise patient she is due to update labs.   Okay to refill 30 day supply of plaquenil?

## 2019-12-31 NOTE — Telephone Encounter (Signed)
Ok to refill 30 day supply of PLQ.

## 2020-01-17 NOTE — Progress Notes (Signed)
Office Visit Note  Patient: Mercedes Hodge             Date of Birth: 07/06/1956           MRN: [id number]             PCP: Ronnald Nian, DO Referring: Ronnald Nian, DO Visit Date: 01/27/2020 Occupation: @GUAROCC @  Subjective:  Medication monitoring   History of Present Illness: Mercedes Hodge is a 64 y.o. female history with history of rheumatoid arthritis and osteoarthritis.  She states she continues to have some stiffness in her hands and also discomfort over bilateral thumb bases.  She denies any joint swelling.  Her bilateral total knee replacements are doing well.  Right total ankle replacement is doing well.  She had her eye examination December 26, 2019 which was within normal limits.  She states she has occasional discomfort in her shoulders and her hips which she describes over the trochanteric area when she sleeps on the side.  Activities of Daily Living:  Patient reports morning stiffness for 10-15  minutes.   Patient Denies nocturnal pain.  Difficulty dressing/grooming: Denies Difficulty climbing stairs: Denies Difficulty getting out of chair: Denies Difficulty using hands for taps, buttons, cutlery, and/or writing: Denies  Review of Systems  Constitutional: Negative for fatigue, night sweats, weight gain and weight loss.  HENT: Negative for mouth sores, trouble swallowing, trouble swallowing, mouth dryness and nose dryness.   Eyes: Negative for pain, redness, itching, visual disturbance and dryness.  Respiratory: Negative for cough, shortness of breath and difficulty breathing.   Cardiovascular: Negative for chest pain, palpitations, hypertension, irregular heartbeat and swelling in legs/feet.  Gastrointestinal: Negative for blood in stool, constipation and diarrhea.  Endocrine: Negative for increased urination.  Genitourinary: Negative for difficulty urinating and vaginal dryness.  Musculoskeletal: Positive for arthralgias, joint pain, joint swelling and  morning stiffness. Negative for myalgias, muscle weakness, muscle tenderness and myalgias.  Skin: Negative for color change, rash, hair loss, redness, skin tightness, ulcers and sensitivity to sunlight.  Allergic/Immunologic: Negative for susceptible to infections.  Neurological: Negative for dizziness, numbness, headaches, memory loss, night sweats and weakness.  Hematological: Positive for bruising/bleeding tendency. Negative for swollen glands.  Psychiatric/Behavioral: Negative for depressed mood, confusion and sleep disturbance. The patient is not nervous/anxious.     PMFS History:  Patient Active Problem List   Diagnosis Date Noted  . Rheumatoid arthritis of multiple sites with negative rheumatoid factor ([place]) 07/02/2019  . High risk medication use 07/02/2019  . Primary osteoarthritis of both hands 07/02/2019  . Status post total bilateral knee replacement 07/02/2019  . Status post right ankle joint replacement 07/02/2019  . Essential hypertension 07/02/2019  . History of asthma 07/02/2019  . History of thyroid cancer 07/02/2019  . Postoperative hypothyroidism 07/02/2019  . Dyslipidemia 07/02/2019  . Gastroesophageal reflux disease without esophagitis 07/02/2019  . Osteopenia of multiple sites 07/02/2019    Past Medical History:  Diagnosis Date  . Arthritis   . Asthma   . Cancer ([place])    thyroid  . Hyperlipidemia   . Hypertension     Family History  Problem Relation Age of Onset  . COPD Mother   . Heart disease Mother   . Cancer Father        thyroid cancer  . Juvenile Rhematoid Arthritis Sister   . Stroke Sister   . Thyroid cancer Brother   . Thyroid cancer Daughter   . Healthy Daughter   . Healthy Son   .  Healthy Daughter    Past Surgical History:  Procedure Laterality Date  . ABDOMINAL HYSTERECTOMY  1993  . CHOLECYSTECTOMY  1982  . LAPAROSCOPIC GASTRIC SLEEVE RESECTION  07/2018  . left knee replacement  2008  . right ankle replacement  2012  . right knee  replacement  2009  . trimallar FX  1995   right ankle   Social History   Social History Narrative  . Not on file   Immunization History  Administered Date(s) Administered  . Influenza-Unspecified 05/22/2019  . Pneumococcal Conjugate-13 06/07/2019  . Tdap 06/07/2019     Objective: Vital Signs: BP 112/68 (BP Location: Left Arm, Patient Position: Sitting, Cuff Size: Normal)   Pulse 78   Resp 15   Ht 5\' 7"  (1.702 m)   Wt 221 lb (100.2 kg)   BMI 34.61 kg/m    Physical Exam Vitals and nursing note reviewed.  Constitutional:      Appearance: She is well-developed.  HENT:     Head: Normocephalic and atraumatic.  Eyes:     Conjunctiva/sclera: Conjunctivae normal.  Cardiovascular:     Rate and Rhythm: Normal rate and regular rhythm.     Heart sounds: Normal heart sounds.  Pulmonary:     Effort: Pulmonary effort is normal.     Breath sounds: Normal breath sounds.  Abdominal:     General: Bowel sounds are normal.     Palpations: Abdomen is soft.  Musculoskeletal:     Cervical back: Normal range of motion.  Lymphadenopathy:     Cervical: No cervical adenopathy.  Skin:    General: Skin is warm and dry.     Capillary Refill: Capillary refill takes less than 2 seconds.  Neurological:     Mental Status: She is alert and oriented to person, place, and time.  Psychiatric:        Behavior: Behavior normal.      Musculoskeletal Exam: C-spine, thoracic and lumbar spine were in good range of motion.  Shoulder joints, elbow joints, wrist joints with good range of motion.  She has left CMC thickening.  PIP and DIP thickening was noted.  No MCP synovitis or tenderness was noted.  Hip joints were in good range of motion.  Her bilateral knee joints are replaced.  Her right ankle joint is replaced.  PIP and DIP thickening in her feet was noted with no tenderness.  CDAI Exam: CDAI Score: 0.4  Patient Global: 2 mm; Provider Global: 2 mm Swollen: 0 ; Tender: 0  Joint Exam 01/27/2020    No joint exam has been documented for this visit   There is currently no information documented on the homunculus. Go to the Rheumatology activity and complete the homunculus joint exam.  Investigation: No additional findings.  Imaging: No results found.  Recent Labs: Lab Results  Component Value Date   WBC 8.0 07/05/2019   HGB 13.8 07/05/2019   PLT 204 07/05/2019   NA 137 07/05/2019   K 3.8 07/05/2019   CL 101 07/05/2019   CO2 28 07/05/2019   GLUCOSE 93 07/05/2019   BUN 20 07/05/2019   CREATININE 0.85 07/05/2019   BILITOT 0.6 07/05/2019   AST 21 07/05/2019   ALT 14 07/05/2019   PROT 6.7 07/05/2019   CALCIUM 9.0 07/05/2019   GFRAA 83 07/05/2019    Speciality Comments: Plaquenil eye exam: 12/26/2019 WNL Follow up in 1 year  Procedures:  No procedures performed Allergies: Lovenox [enoxaparin sodium]   Assessment / Plan:  Visit Diagnoses: Rheumatoid arthritis of multiple sites with negative rheumatoid factor ([facility]) - Positive anti-CCP (repeat negative) and elevated CRP, Diagnosed in [place] by her rheumatologist. Hx of inflammatory arthritis.  Patient is clinically doing well with no synovitis on my examination.  High risk medication use - PLQ 400 mg p.o. daily. Plaquenil eye exam: 12/26/2019 - Plan: CBC with Differential/Platelet, COMPLETE METABOLIC PANEL WITH GFR today and then every 5 months.  Give refill on Plaquenil prescription.  Primary osteoarthritis of both hands-she is being ongoing stiffness in her hands over CMC PIP and DIP.  Have given her a handout on hand exercises.  Status post total bilateral knee replacement-doing well.  Status post right ankle joint replacement - History of Charcot joint which required total ankle replacement.  She has some difficulty with mobility.  Osteopenia of multiple sites - DEXA on April 2019 which showed a T score of -1.1. - Plan: DG DXA BODY COMPOSITION  Essential hypertension-blood pressure is well  controlled.  Other medical problems are listed as follows:  History of thyroid cancer  History of asthma  Gastroesophageal reflux disease without esophagitis - She takes Protonix on as needed basis.  Dyslipidemia  Postoperative hypothyroidism  Orders: Orders Placed This Encounter  Procedures  . DG DXA BODY COMPOSITION  . CBC with Differential/Platelet  . COMPLETE METABOLIC PANEL WITH GFR   Meds ordered this encounter  Medications  . hydroxychloroquine (PLAQUENIL) 200 MG tablet    Sig: Take 2 tablets (400 mg total) by mouth daily.    Dispense:  180 tablet    Refill:  0      Follow-Up Instructions: Return in about 5 months (around 06/28/2020) for Rheumatoid arthritis, Osteoarthritis.   Bo Merino, MD  Note - This record has been created using Editor, commissioning.  Chart creation errors have been sought, but may not always  have been located. Such creation errors do not reflect on  the standard of medical care.

## 2020-01-27 ENCOUNTER — Ambulatory Visit: Payer: Medicare HMO | Admitting: Rheumatology

## 2020-01-27 ENCOUNTER — Other Ambulatory Visit: Payer: Self-pay

## 2020-01-27 ENCOUNTER — Encounter: Payer: Self-pay | Admitting: Rheumatology

## 2020-01-27 VITALS — BP 112/68 | HR 78 | Resp 15 | Ht 67.0 in | Wt 221.0 lb

## 2020-01-27 DIAGNOSIS — M0609 Rheumatoid arthritis without rheumatoid factor, multiple sites: Secondary | ICD-10-CM | POA: Diagnosis not present

## 2020-01-27 DIAGNOSIS — Z79899 Other long term (current) drug therapy: Secondary | ICD-10-CM

## 2020-01-27 DIAGNOSIS — M19042 Primary osteoarthritis, left hand: Secondary | ICD-10-CM

## 2020-01-27 DIAGNOSIS — M19041 Primary osteoarthritis, right hand: Secondary | ICD-10-CM

## 2020-01-27 DIAGNOSIS — Z8709 Personal history of other diseases of the respiratory system: Secondary | ICD-10-CM

## 2020-01-27 DIAGNOSIS — I10 Essential (primary) hypertension: Secondary | ICD-10-CM

## 2020-01-27 DIAGNOSIS — M8589 Other specified disorders of bone density and structure, multiple sites: Secondary | ICD-10-CM | POA: Diagnosis not present

## 2020-01-27 DIAGNOSIS — Z96653 Presence of artificial knee joint, bilateral: Secondary | ICD-10-CM

## 2020-01-27 DIAGNOSIS — K219 Gastro-esophageal reflux disease without esophagitis: Secondary | ICD-10-CM

## 2020-01-27 DIAGNOSIS — Z96661 Presence of right artificial ankle joint: Secondary | ICD-10-CM

## 2020-01-27 DIAGNOSIS — Z85850 Personal history of malignant neoplasm of thyroid: Secondary | ICD-10-CM | POA: Diagnosis not present

## 2020-01-27 DIAGNOSIS — E785 Hyperlipidemia, unspecified: Secondary | ICD-10-CM

## 2020-01-27 DIAGNOSIS — E890 Postprocedural hypothyroidism: Secondary | ICD-10-CM

## 2020-01-27 MED ORDER — HYDROXYCHLOROQUINE SULFATE 200 MG PO TABS: 400.0000 mg | ORAL_TABLET | Freq: Every day | ORAL | 0 refills | Status: DC

## 2020-01-27 NOTE — Patient Instructions (Signed)
Hand Exercises Hand exercises can be helpful for almost anyone. These exercises can strengthen the hands, improve flexibility and movement, and increase blood flow to the hands. These results can make work and daily tasks easier. Hand exercises can be especially helpful for people who have joint pain from arthritis or have nerve damage from overuse (carpal tunnel syndrome). These exercises can also help people who have injured a hand. Exercises Most of these hand exercises are gentle stretching and motion exercises. It is usually safe to do them often throughout the day. Warming up your hands before exercise may help to reduce stiffness. You can do this with gentle massage or by placing your hands in warm water for 10-15 minutes. It is normal to feel some stretching, pulling, tightness, or mild discomfort as you begin new exercises. This will gradually improve. Stop an exercise right away if you feel sudden, severe pain or your pain gets worse. Ask your health care provider which exercises are best for you. Knuckle bend or "claw" fist 1. Stand or sit with your arm, hand, and all five fingers pointed straight up. Make sure to keep your wrist straight during the exercise. 2. Gently bend your fingers down toward your palm until the tips of your fingers are touching the top of your palm. Keep your big knuckle straight and just bend the small knuckles in your fingers. 3. Hold this position for __________ seconds. 4. Straighten (extend) your fingers back to the starting position. Repeat this exercise 5-10 times with each hand. Full finger fist 1. Stand or sit with your arm, hand, and all five fingers pointed straight up. Make sure to keep your wrist straight during the exercise. 2. Gently bend your fingers into your palm until the tips of your fingers are touching the middle of your palm. 3. Hold this position for __________ seconds. 4. Extend your fingers back to the starting position, stretching every  joint fully. Repeat this exercise 5-10 times with each hand. Straight fist 1. Stand or sit with your arm, hand, and all five fingers pointed straight up. Make sure to keep your wrist straight during the exercise. 2. Gently bend your fingers at the big knuckle, where your fingers meet your hand, and the middle knuckle. Keep the knuckle at the tips of your fingers straight and try to touch the bottom of your palm. 3. Hold this position for __________ seconds. 4. Extend your fingers back to the starting position, stretching every joint fully. Repeat this exercise 5-10 times with each hand. Tabletop 1. Stand or sit with your arm, hand, and all five fingers pointed straight up. Make sure to keep your wrist straight during the exercise. 2. Gently bend your fingers at the big knuckle, where your fingers meet your hand, as far down as you can while keeping the small knuckles in your fingers straight. Think of forming a tabletop with your fingers. 3. Hold this position for __________ seconds. 4. Extend your fingers back to the starting position, stretching every joint fully. Repeat this exercise 5-10 times with each hand. Finger spread 1. Place your hand flat on a table with your palm facing down. Make sure your wrist stays straight as you do this exercise. 2. Spread your fingers and thumb apart from each other as far as you can until you feel a gentle stretch. Hold this position for __________ seconds. 3. Bring your fingers and thumb tight together again. Hold this position for __________ seconds. Repeat this exercise 5-10 times with each hand.   Making circles 1. Stand or sit with your arm, hand, and all five fingers pointed straight up. Make sure to keep your wrist straight during the exercise. 2. Make a circle by touching the tip of your thumb to the tip of your index finger. 3. Hold for __________ seconds. Then open your hand wide. 4. Repeat this motion with your thumb and each finger on your  hand. Repeat this exercise 5-10 times with each hand. Thumb motion 1. Sit with your forearm resting on a table and your wrist straight. Your thumb should be facing up toward the ceiling. Keep your fingers relaxed as you move your thumb. 2. Lift your thumb up as high as you can toward the ceiling. Hold for __________ seconds. 3. Bend your thumb across your palm as far as you can, reaching the tip of your thumb for the small finger (pinkie) side of your palm. Hold for __________ seconds. Repeat this exercise 5-10 times with each hand. Grip strengthening  1. Hold a stress ball or other soft ball in the middle of your hand. 2. Slowly increase the pressure, squeezing the ball as much as you can without causing pain. Think of bringing the tips of your fingers into the middle of your palm. All of your finger joints should bend when doing this exercise. 3. Hold your squeeze for __________ seconds, then relax. Repeat this exercise 5-10 times with each hand. Contact a health care provider if:  Your hand pain or discomfort gets much worse when you do an exercise.  Your hand pain or discomfort does not improve within 2 hours after you exercise. If you have any of these problems, stop doing these exercises right away. Do not do them again unless your health care provider says that you can. Get help right away if:  You develop sudden, severe hand pain or swelling. If this happens, stop doing these exercises right away. Do not do them again unless your health care provider says that you can. This information is not intended to replace advice given to you by your health care provider. Make sure you discuss any questions you have with your health care provider. Document Revised: 12/20/2018 Document Reviewed: 08/30/2018 Elsevier Patient Education  2020 [employer]  

## 2020-01-28 LAB — COMPLETE METABOLIC PANEL WITH GFR
AG Ratio: 1.6 (calc) (ref 1.0–2.5)
ALT: 20 U/L (ref 6–29)
AST: 24 U/L (ref 10–35)
Albumin: 4.1 g/dL (ref 3.6–5.1)
Alkaline phosphatase (APISO): 60 U/L (ref 37–153)
BUN: 17 mg/dL (ref 7–25)
CO2: 28 mmol/L (ref 20–32)
Calcium: 9.5 mg/dL (ref 8.6–10.4)
Chloride: 100 mmol/L (ref 98–110)
Creat: 0.88 mg/dL (ref 0.50–0.99)
GFR, Est African American: 79 mL/min/{1.73_m2} (ref >=60)
GFR, Est Non African American: 68 mL/min/{1.73_m2} (ref >=60)
Globulin: 2.6 g/dL (calc) (ref 1.9–3.7)
Glucose, Bld: 86 mg/dL (ref 65–99)
Potassium: 4.1 mmol/L (ref 3.5–5.3)
Sodium: 139 mmol/L (ref 135–146)
Total Bilirubin: 0.6 mg/dL (ref 0.2–1.2)
Total Protein: 6.7 g/dL (ref 6.1–8.1)

## 2020-01-28 LAB — CBC WITH DIFFERENTIAL/PLATELET
Absolute Monocytes: 738 cells/uL (ref 200–950)
Basophils Absolute: 62 cells/uL (ref 0–200)
Basophils Relative: 0.9 %
Eosinophils Absolute: 173 cells/uL (ref 15–500)
Eosinophils Relative: 2.5 %
HCT: 43 % (ref 35.0–45.0)
Hemoglobin: 14.6 g/dL (ref 11.7–15.5)
Lymphs Abs: 1601 cells/uL (ref 850–3900)
MCH: 33 pg (ref 27.0–33.0)
MCHC: 34 g/dL (ref 32.0–36.0)
MCV: 97.3 fL (ref 80.0–100.0)
MPV: 10 fL (ref 7.5–12.5)
Monocytes Relative: 10.7 %
Neutro Abs: 4326 cells/uL (ref 1500–7800)
Neutrophils Relative %: 62.7 %
Platelets: 193 10*3/uL (ref 140–400)
RBC: 4.42 10*6/uL (ref 3.80–5.10)
RDW: 12.4 % (ref 11.0–15.0)
Total Lymphocyte: 23.2 %
WBC: 6.9 10*3/uL (ref 3.8–10.8)

## 2020-01-28 NOTE — Progress Notes (Signed)
CBC and CMP are normal.

## 2020-02-03 ENCOUNTER — Ambulatory Visit: Payer: Self-pay

## 2020-02-03 DIAGNOSIS — E785 Hyperlipidemia, unspecified: Secondary | ICD-10-CM

## 2020-02-03 DIAGNOSIS — I10 Essential (primary) hypertension: Secondary | ICD-10-CM

## 2020-02-03 NOTE — Chronic Care Management (AMB) (Signed)
Hypertension assessment was performed for Mercedes Hodge , [birth year] on 02/03/2020 by Cristie Hem. The patient's last 3 BP readings were 126/82 on 06/07/2019, 132/80 on 09/11/2019, and 112/68 on 01/27/2020. Since patient's last CPP visit, the following interventions have been made to improve BP control: 01/27/20: Patient presented to Dr. Estanislado Pandy, Famotidine + pantoprazole stopped. 12/23/19: Patient switched to amlodipine-benazepril 5-40 mg daily due to formulary concerns.  The patient has not had an ED visit since their last CPP follow up. The patient's current Hypertension and PDC medications are: Amlodipine-olmesartan 5-40 mg (12/30/19 90DS), Pravastatin 20 mg (10/28/19 90DS). Patient currently has a greater than 5 day gap between last fill. The patient is currently checking their BP: weekly.  Their current BP Before Breakfast 112/68, 116/72; The patient is not currently having low BP <90/60.  The patient is not currently having BP's >180/100, and currently having an average BP<140/90.   I educated patient to inform proper points on checking BP at home such as taking resting blood pressure: sitting quietly for 5 minutes, not within 30 min. of exercising, no talking; sitting with feet flat on the floor and arm level with heart; not drinking caffeine or smoking a cigarette at least 30 min. prior to checking; and making sure to use the right size cuff (the length of the cuff's bladder should be at least equal to 75% of the circumference of the upper arm).  Patient's current diet is:  Stable.  Patient diet response: Patient has been in vacation in [place], but states her diet has remained stable. Still following low-sodium diet.  Patient's exercise response: Still walking regularly, and states she stays busy "running around town." Patient's misc comments:  Patient has yet to start amlodipine-benazepril and has been continuing to use her previous supply of amlodipine-olmesartan. States she recently requested a refill of  pravastatin, which was delayed due to her having an extra supply prior to switching insurances at the end of 2020. States she is adherent to her medications and denies any missed doses.   [place] at [facility]  [phone]

## 2020-02-04 DIAGNOSIS — M8588 Other specified disorders of bone density and structure, other site: Secondary | ICD-10-CM | POA: Diagnosis not present

## 2020-02-04 DIAGNOSIS — Z780 Asymptomatic menopausal state: Secondary | ICD-10-CM | POA: Diagnosis not present

## 2020-02-04 LAB — HM DEXA SCAN

## 2020-02-06 ENCOUNTER — Telehealth: Payer: Self-pay | Admitting: *Deleted

## 2020-02-06 NOTE — Telephone Encounter (Signed)
Received DEXA results from [facility].  Date of Scan: 02/04/2020 Lowest T-score and site measured: -1.1 AP Spine Significant changes in BMD and site measured (5% and above): n/a  Current Regimen:n/a  Recommendation: Calcium, Vitamin D and resistive exercises. Repeat Dexa in 2 years.   Patient advised of results and recommendations.

## 2020-02-07 ENCOUNTER — Encounter: Payer: Self-pay | Admitting: Family Medicine

## 2020-02-07 ENCOUNTER — Ambulatory Visit (HOSPITAL_COMMUNITY)
Admission: EM | Admit: 2020-02-07 | Discharge: 2020-02-07 | Disposition: A | Discharge status: Home / Self Care | Payer: Medicare HMO | Attending: Internal Medicine | Admitting: Internal Medicine

## 2020-02-07 ENCOUNTER — Other Ambulatory Visit: Payer: Self-pay

## 2020-02-07 ENCOUNTER — Encounter (HOSPITAL_COMMUNITY): Payer: Self-pay

## 2020-02-07 ENCOUNTER — Ambulatory Visit (INDEPENDENT_AMBULATORY_CARE_PROVIDER_SITE_OTHER): Payer: Medicare HMO

## 2020-02-07 DIAGNOSIS — S2232XA Fracture of one rib, left side, initial encounter for closed fracture: Secondary | ICD-10-CM | POA: Diagnosis not present

## 2020-02-07 DIAGNOSIS — R0781 Pleurodynia: Secondary | ICD-10-CM | POA: Diagnosis not present

## 2020-02-07 MED ORDER — IBUPROFEN 600 MG PO TABS
600.0000 mg | ORAL_TABLET | Freq: Four times a day (QID) | ORAL | 0 refills | Status: DC | PRN
Start: 2020-02-07 — End: 2020-05-19

## 2020-02-07 NOTE — Telephone Encounter (Signed)
Patient called back after she spoke with triage nurse to get appointment to be evaluated. Informed patient that we didn't have any availability and to contact other office to be seen.

## 2020-02-07 NOTE — Progress Notes (Signed)
PCP is Betty Martinique, MD

## 2020-02-07 NOTE — ED Triage Notes (Signed)
Patient reports falling last night after losing her balance, hitting ribs on wooden stairs. Now complaining of left rib pain, bruising. Denies hitting head, losing consciousness, N/V.

## 2020-02-07 NOTE — ED Provider Notes (Addendum)
[facility]    CSN: [id number] Arrival date & time: 02/07/20  1459      History   Chief Complaint Chief Complaint  Patient presents with  . Fall    HPI Mercedes Hodge is a 64 y.o. female comes to the urgent care with left sided chest pain which started after fall yesterday.  Patient lost her balance and fell yesterday.  Following that she has been experiencing severe, sharp pain in the left side of the chest.  Pain is aggravated by taking a deep breath and moving.  Ibuprofen has not relieved the pain.  No shortness of breath, cough or sputum production.  Patient denies hitting her head.  No loss of consciousness, dizziness, nausea vomiting or headaches.Marland Kitchen   HPI  Past Medical History:  Diagnosis Date  . Arthritis   . Asthma   . Cancer ([place])    thyroid  . Hyperlipidemia   . Hypertension     Patient Active Problem List   Diagnosis Date Noted  . Rheumatoid arthritis of multiple sites with negative rheumatoid factor ([place]) 07/02/2019  . High risk medication use 07/02/2019  . Primary osteoarthritis of both hands 07/02/2019  . Status post total bilateral knee replacement 07/02/2019  . Status post right ankle joint replacement 07/02/2019  . Essential hypertension 07/02/2019  . History of asthma 07/02/2019  . History of thyroid cancer 07/02/2019  . Postoperative hypothyroidism 07/02/2019  . Dyslipidemia 07/02/2019  . Gastroesophageal reflux disease without esophagitis 07/02/2019  . Osteopenia of multiple sites 07/02/2019    Past Surgical History:  Procedure Laterality Date  . ABDOMINAL HYSTERECTOMY  1993  . CHOLECYSTECTOMY  1982  . LAPAROSCOPIC GASTRIC SLEEVE RESECTION  07/2018  . left knee replacement  2008  . right ankle replacement  2012  . right knee replacement  2009  . trimallar FX  1995   right ankle    OB History   No obstetric history on file.      Home Medications    Prior to Admission medications   Medication Sig Start Date End Date  Taking? Authorizing Provider  amLODipine-benazepril (LOTREL) 5-40 MG capsule Take 1 capsule by mouth daily. 12/25/19   Cirigliano, Garvin Fila, DO  Cholecalciferol (VITAMIN D) 50 MCG (2000 UT) tablet Take 2,000 Units by mouth daily.    [provider]  conjugated estrogens (PREMARIN) vaginal cream 1 applicatorful twice per week 06/07/19   Cirigliano, Stanton Kidney K, DO  diclofenac sodium (VOLTAREN) 1 % GEL Apply topically 4 (four) times daily.    [provider]  escitalopram (LEXAPRO) 10 MG tablet Take 1 tablet (10 mg total) by mouth daily. 09/11/19   Cirigliano, Mary K, DO  fluticasone (FLONASE) 50 MCG/ACT nasal spray Place into both nostrils as needed for allergies or rhinitis.    [provider]  fluticasone furoate-vilanterol (BREO ELLIPTA) 100-25 MCG/INH AEPB Inhale 1 puff into the lungs daily. 09/18/19   Cirigliano, Garvin Fila, DO  hydrochlorothiazide (MICROZIDE) 12.5 MG capsule Take 12.5 mg by mouth daily.    [provider]  hydroxychloroquine (PLAQUENIL) 200 MG tablet Take 2 tablets (400 mg total) by mouth daily. 01/27/20   Bo Merino, MD  ibuprofen (ADVIL) 600 MG tablet Take 1 tablet (600 mg total) by mouth every 6 (six) hours as needed. 02/07/20   Chase Picket, MD  levothyroxine (SYNTHROID) 150 MCG tablet Take 1 tablet (150 mcg total) by mouth every other day. 09/11/19   CiriglianoGarvin Fila, DO  levothyroxine (SYNTHROID) 175 MCG tablet  Take 1 tablet (175 mcg total) by mouth every other day. 09/11/19   Cirigliano, Garvin Fila, DO  pravastatin (PRAVACHOL) 20 MG tablet Take 1 tablet (20 mg total) by mouth daily. 09/11/19   CiriglianoGarvin Fila, DO    Family History Family History  Problem Relation Age of Onset  . COPD Mother   . Heart disease Mother   . Cancer Father        thyroid cancer  . Juvenile Rhematoid Arthritis Sister   . Stroke Sister   . Thyroid cancer Brother   . Thyroid cancer Daughter   . Healthy Daughter   . Healthy Son   . Healthy Daughter      Social History Social History   Tobacco Use  . Smoking status: Never Smoker  . Smokeless tobacco: Never Used  Substance Use Topics  . Alcohol use: Yes    Comment: occ wine   . Drug use: Never     Allergies   Lovenox [enoxaparin sodium]   Review of Systems Review of Systems  HENT: Negative.   Respiratory: Positive for shortness of breath. Negative for cough, choking, chest tightness and wheezing.   Cardiovascular: Positive for chest pain. Negative for palpitations.  Gastrointestinal: Negative.   Genitourinary: Negative.   Musculoskeletal: Negative.      Physical Exam Triage Vital Signs ED Triage Vitals  Enc Vitals Group     BP --      Pulse Rate 02/07/20 1519 64     Resp 02/07/20 1519 14     Temp 02/07/20 1519 98 F (36.7 C)     Temp Source 02/07/20 1519 Oral     SpO2 02/07/20 1519 98 %     Weight --      Height --      Head Circumference --      Peak Flow --      Pain Score 02/07/20 1521 2     Pain Loc --      Pain Edu? --      Excl. in [place]? --    No data found.  Updated Vital Signs Pulse 64   Temp 98 F (36.7 C) (Oral)   Resp 14   SpO2 98%   Visual Acuity Right Eye Distance:   Left Eye Distance:   Bilateral Distance:    Right Eye Near:   Left Eye Near:    Bilateral Near:     Physical Exam   UC Treatments / Results  Labs (all labs ordered are listed, but only abnormal results are displayed) Labs Reviewed - No data to display  EKG   Radiology DG Ribs Unilateral W/Chest Left  Result Date: 02/07/2020 CLINICAL DATA:  Fall.  Left lower rib pain EXAM: LEFT RIBS AND CHEST - 3+ VIEW COMPARISON:  None. FINDINGS: Heart size upper normal. Negative for heart failure. Linear densities right lung base may be atelectasis or scarring. No effusion. Fracture left 6 rib anteriorly. Slight displacement. No other rib fractures identified. No pneumothorax. IMPRESSION: Fracture left sixth rib Right basilar atelectasis/scarring. Electronically Signed   By:  Franchot Gallo M.D.   On: 02/07/2020 16:56    Procedures Procedures (including critical care time)  Medications Ordered in UC Medications - No data to display  Initial Impression / Assessment and Plan / UC Course  I have reviewed the triage vital signs and the nursing notes.  Pertinent labs & imaging results that were available during my care of the patient were reviewed by me and considered in  my medical decision making (see chart for details).     1.  Left rib fracture, sixth rib: Chest x-ray is positive for left sixth rib Ibuprofen 600 mg every 6 hours as needed for pain Intermittent deep breaths to prevent atelectasis/pneumonia Return precautions given If patient symptoms worsen she is advised to return to the urgent care to be reevaluated. Final Clinical Impressions(s) / UC Diagnoses   Final diagnoses:  Closed fracture of one rib of left side, initial encounter   Discharge Instructions   None    ED Prescriptions    Medication Sig Dispense Auth. Provider   ibuprofen (ADVIL) 600 MG tablet Take 1 tablet (600 mg total) by mouth every 6 (six) hours as needed. 30 tablet Jerrilyn Messinger, Myrene Galas, MD     PDMP not reviewed this encounter.   Chase Picket, MD 02/07/20 1701    Chase Picket, MD 02/07/20 [phone]

## 2020-02-07 NOTE — Telephone Encounter (Signed)
Please see message and advise.  Thank you. ° °

## 2020-02-14 ENCOUNTER — Other Ambulatory Visit: Payer: Self-pay

## 2020-02-17 ENCOUNTER — Encounter: Payer: Self-pay | Admitting: Family Medicine

## 2020-02-17 ENCOUNTER — Other Ambulatory Visit: Payer: Self-pay

## 2020-02-17 ENCOUNTER — Ambulatory Visit (INDEPENDENT_AMBULATORY_CARE_PROVIDER_SITE_OTHER): Payer: Medicare HMO | Admitting: Family Medicine

## 2020-02-17 VITALS — BP 122/76 | HR 71 | Temp 97.9°F | Resp 12 | Ht 67.0 in | Wt 223.0 lb

## 2020-02-17 DIAGNOSIS — N952 Postmenopausal atrophic vaginitis: Secondary | ICD-10-CM

## 2020-02-17 DIAGNOSIS — E785 Hyperlipidemia, unspecified: Secondary | ICD-10-CM | POA: Diagnosis not present

## 2020-02-17 DIAGNOSIS — I10 Essential (primary) hypertension: Secondary | ICD-10-CM | POA: Diagnosis not present

## 2020-02-17 DIAGNOSIS — E890 Postprocedural hypothyroidism: Secondary | ICD-10-CM | POA: Diagnosis not present

## 2020-02-17 DIAGNOSIS — Z1159 Encounter for screening for other viral diseases: Secondary | ICD-10-CM

## 2020-02-17 DIAGNOSIS — F320 Major depressive disorder, single episode, mild: Secondary | ICD-10-CM | POA: Insufficient documentation

## 2020-02-17 DIAGNOSIS — Z9189 Other specified personal risk factors, not elsewhere classified: Secondary | ICD-10-CM

## 2020-02-17 LAB — LIPID PANEL
Cholesterol: 188 mg/dL (ref 0–200)
HDL: 73.2 mg/dL (ref >39.00)
LDL Cholesterol: 100 mg/dL — ABNORMAL HIGH (ref 0–99)
NonHDL: 114.57
Total CHOL/HDL Ratio: 3
Triglycerides: 74 mg/dL (ref 0.0–149.0)
VLDL: 14.8 mg/dL (ref 0.0–40.0)

## 2020-02-17 LAB — TSH: TSH: 0.62 u[IU]/mL (ref 0.35–4.50)

## 2020-02-17 MED ORDER — ESTRADIOL 0.1 MG/GM VA CREA: 1.0000 | TOPICAL_CREAM | VAGINAL | 6 refills | Status: DC

## 2020-02-17 NOTE — Assessment & Plan Note (Signed)
BP reading in the lower normal. For now no changes but we may need to readjust antihypertensive medications if frequently lower normal BP's. Continue monitoring BP's and bring BP readings next visit. Eye exam is current.

## 2020-02-17 NOTE — Progress Notes (Signed)
HPI: Mercedes Hodge is a 64 y.o. female, who is here today to establish care.  Former PCP: Dr Bryan Lemma Last preventive routine visit: 10/2016.  Chronic medical problems: RA, sensory hearing loss (bilateral), hyperlipidemia,asthma, hypertension, hypothyroidism,vit D deficiency,and depression among some. Recently evaluated in the ER after a fall at home, she lost balance. Fall complicated by left rib fracture.  Hypothyroidism: S/P total thyroidectomy due to papillary thyroid cancer. Currently she is on Levothyroxine 150 mcg and 175 mcg alternating. Tolerating medication well, no side effects reported. She has not noted dysphagia, palpitations,tremor, cold/heat intolerance, or abnormal weight loss.  HTN: Dx'ed in 2002. Negative severe/frequent headache, visual changes, chest pain, dyspnea, focal weakness, or edema. She is on Amlodipine-Benazepril 5-40 mg daily and HCTZ 12.5 mg daily. Home BP readings low 110's/50's.  Recently had labs at her rheumatologist's office, CMP.  HLD: She is on Pravastatin 20 mg daily. Tolerating medication well.  Lab Results  Component Value Date   LDLCALC 103 10/17/2016   Depression: Dx'ed around 10 years ago after divorce. She is on Lexapro 10 mg daily. She reports having no symptoms.  Vaginal atrophy: She is on Premarin vaginal cream, which helps but would like to know if there is  Cheaper option. Last mammogram 04/11/2018.   Last pap smear in 2018, negative for malignancy and HPV not detected. She has not noted vaginal discharge or bleeding. Negative for dysuria or gross hematuria.  Review of Systems  Constitutional: Negative for activity change, appetite change, fatigue and fever.  HENT: Negative for mouth sores, nosebleeds and sore throat.   Respiratory: Negative for cough and wheezing.   Gastrointestinal: Negative for abdominal pain, nausea and vomiting.       Negative for changes in bowel habits.  Genitourinary: Negative for  decreased urine volume, difficulty urinating and frequency.       + Stress urine incontinence.  Musculoskeletal: Positive for arthralgias. Negative for gait problem and myalgias.  Allergic/Immunologic: Positive for environmental allergies.  Neurological: Negative for syncope and facial asymmetry.  Rest see pertinent positives and negatives per HPI.  Current Outpatient Medications on File Prior to Visit  Medication Sig Dispense Refill  . amLODipine-benazepril (LOTREL) 5-40 MG capsule Take 1 capsule by mouth daily. 90 capsule 3  . Cholecalciferol (VITAMIN D) 50 MCG (2000 UT) tablet Take 2,000 Units by mouth daily.    Marland Kitchen conjugated estrogens (PREMARIN) vaginal cream 1 applicatorful twice per week 45 g 5  . diclofenac sodium (VOLTAREN) 1 % GEL Apply topically 4 (four) times daily.    Marland Kitchen escitalopram (LEXAPRO) 10 MG tablet Take 1 tablet (10 mg total) by mouth daily. 90 tablet 3  . fluticasone (FLONASE) 50 MCG/ACT nasal spray Place into both nostrils as needed for allergies or rhinitis.    . fluticasone furoate-vilanterol (BREO ELLIPTA) 100-25 MCG/INH AEPB Inhale 1 puff into the lungs daily. 180 each 1  . hydrochlorothiazide (MICROZIDE) 12.5 MG capsule Take 12.5 mg by mouth daily.    . hydroxychloroquine (PLAQUENIL) 200 MG tablet Take 2 tablets (400 mg total) by mouth daily. 180 tablet 0  . ibuprofen (ADVIL) 600 MG tablet Take 1 tablet (600 mg total) by mouth every 6 (six) hours as needed. 30 tablet 0  . levothyroxine (SYNTHROID) 150 MCG tablet Take 1 tablet (150 mcg total) by mouth every other day. 90 tablet 3  . levothyroxine (SYNTHROID) 175 MCG tablet Take 1 tablet (175 mcg total) by mouth every other day. 90 tablet 3  . pravastatin (PRAVACHOL) 20  MG tablet Take 1 tablet (20 mg total) by mouth daily. 90 tablet 3   No current facility-administered medications on file prior to visit.   Past Medical History:  Diagnosis Date  . Allergy   . Arthritis   . Asthma   . Cancer ([place])    thyroid  .  GERD (gastroesophageal reflux disease)   . Hyperlipidemia   . Hypertension   . Thyroid disease    Postsurgical hypothyroidism   Allergies  Allergen Reactions  . Lovenox [Enoxaparin Sodium] Rash    rash    Family History  Problem Relation Age of Onset  . COPD Mother   . Heart disease Mother   . Cancer Father        thyroid cancer  . Juvenile Rhematoid Arthritis Sister   . Stroke Sister   . Thyroid cancer Brother   . Thyroid cancer Daughter   . Healthy Daughter   . Healthy Son   . Healthy Daughter     Social History   Socioeconomic History  . Marital status: Married    Spouse name: Not on file  . Number of children: Not on file  . Years of education: Not on file  . Highest education level: Not on file  Occupational History  . Not on file  Tobacco Use  . Smoking status: Never Smoker  . Smokeless tobacco: Never Used  Substance and Sexual Activity  . Alcohol use: Yes    Comment: occ wine   . Drug use: Never  . Sexual activity: Yes    Birth control/protection: None  Other Topics Concern  . Not on file  Social History Narrative  . Not on file   Social Determinants of Health   Financial Resource Strain:   . Difficulty of Paying Living Expenses:   Food Insecurity:   . Worried About Charity fundraiser in the Last Year:   . Arboriculturist in the Last Year:   Transportation Needs:   . Film/video editor (Medical):   Marland Kitchen Lack of Transportation (Non-Medical):   Physical Activity:   . Days of Exercise per Week:   . Minutes of Exercise per Session:   Stress:   . Feeling of Stress :   Social Connections:   . Frequency of Communication with Friends and Family:   . Frequency of Social Gatherings with Friends and Family:   . Attends Religious Services:   . Active Member of Clubs or Organizations:   . Attends Archivist Meetings:   Marland Kitchen Marital Status:     Vitals:   02/17/20 0908  BP: 122/76  Pulse: 71  Resp: 12  Temp: 97.9 F (36.6 C)  SpO2:  94%   Body mass index is 34.93 kg/m.  Physical Exam  Nursing note and vitals reviewed. Constitutional: She is oriented to person, place, and time. She appears well-developed. No distress.  HENT:  Head: Normocephalic and atraumatic.  Mouth/Throat: Oropharynx is clear and moist and mucous membranes are normal.  Eyes: Pupils are equal, round, and reactive to light. Conjunctivae are normal.  Cardiovascular: Normal rate and regular rhythm.  No murmur heard. Pulses:      Dorsalis pedis pulses are 2+ on the right side and 2+ on the left side.  Respiratory: Effort normal and breath sounds normal. No respiratory distress.  GI: Soft. She exhibits no mass. There is no hepatomegaly. There is no abdominal tenderness.  Musculoskeletal:        General: No edema.  Lymphadenopathy:  She has no cervical adenopathy.  Neurological: She is alert and oriented to person, place, and time. She has normal strength. No cranial nerve deficit. Gait normal.  Skin: Skin is warm. Ecchymosis noted. No rash noted. No erythema.     Psychiatric: She has a normal mood and affect.  Well groomed, good eye contact.   ASSESSMENT AND PLAN:  Ms. Mercedes Hodge was seen today for establish care.  Diagnoses and all orders for this visit:  Orders Placed This Encounter  Procedures  . Lipid panel  . Hepatitis C antibody  . TSH   Lab Results  Component Value Date   TSH 0.62 02/17/2020   Lab Results  Component Value Date   CHOL 188 02/17/2020   HDL 73.20 02/17/2020   LDLCALC 100 (H) 02/17/2020   TRIG 74.0 02/17/2020   CHOLHDL 3 02/17/2020    Dyslipidemia Continue Pravastatin 20 mg daily. Will follow TSH result and give further recommendations accordingly.   Essential hypertension BP reading in the lower normal. For now no changes but we may need to readjust antihypertensive medications if frequently lower normal BP's. Continue monitoring BP's and bring BP readings next visit. Eye exam is  current.  Postoperative hypothyroidism No changes in current management, will follow labs done today and will give further recommendations accordingly.   Depression, major, single episode, mild ([place]) Problem is well controlled. Continue Lexapro 10 mg daily. We may consider trying to decrease dose and wean off medication in the future if problem still well controlled.  Atrophic vaginitis Because of Premarin vaginal cream cost, will change to estradiol vaginal cream,may be cheaper. Estradiol vaginal cream 2 times per week.  Encounter for HCV screening test for high risk patient -     Hepatitis C antibody   Return in about 6 months (around 08/18/2020).   Masen Salvas G. [place], MD  [facility]. [place] office.

## 2020-02-17 NOTE — Assessment & Plan Note (Signed)
Because of Premarin vaginal cream cost, will change to estradiol vaginal cream,may be cheaper. Estradiol vaginal cream 2 times per week.

## 2020-02-17 NOTE — Assessment & Plan Note (Signed)
Problem is well controlled. Continue Lexapro 10 mg daily. We may consider trying to decrease dose and wean off medication in the future if problem still well controlled.

## 2020-02-17 NOTE — Assessment & Plan Note (Addendum)
Continue Pravastatin 20 mg daily. Will follow TSH result and give further recommendations accordingly.

## 2020-02-17 NOTE — Patient Instructions (Addendum)
A few things to remember from today's visit:  Premarin to be changed for Estradiol vaginal cream. No changes in rest of your meds. We will try to obtain copy of last colonoscopy. Continue monitoring blood pressures.  If you need refills please call your pharmacy. Do not use My Chart to request refills or for acute issues that need immediate attention.    Please be sure medication list is accurate. If a new problem present, please set up appointment sooner than planned today.

## 2020-02-17 NOTE — Assessment & Plan Note (Signed)
No changes in current management, will follow labs done today and will give further recommendations accordingly.  

## 2020-02-18 LAB — HEPATITIS C ANTIBODY
Hepatitis C Ab: NONREACTIVE
SIGNAL TO CUT-OFF: 0.01 (ref <1.00)

## 2020-03-31 ENCOUNTER — Other Ambulatory Visit: Payer: Self-pay | Admitting: Rheumatology

## 2020-04-15 ENCOUNTER — Encounter: Payer: Self-pay | Admitting: Family Medicine

## 2020-04-21 ENCOUNTER — Telehealth: Payer: Self-pay

## 2020-04-21 DIAGNOSIS — I10 Essential (primary) hypertension: Secondary | ICD-10-CM

## 2020-04-21 DIAGNOSIS — Z8709 Personal history of other diseases of the respiratory system: Secondary | ICD-10-CM

## 2020-04-21 NOTE — Progress Notes (Addendum)
Chronic Care Management Pharmacy Assistant   Name: Mercedes Hodge  MRN: [id number] DOB: [birth year]  Reason for Encounter: Asthma Disease State Call.  Patient Questions:  1.  Have you seen any other providers since your last visit? Yes, 02/07/2020 ED, 02/17/2020 PCP- [place] Betty G, MD  2.  Any changes in your medicines or health? Yes, Ibuprofen 600 mg PRN    PCP : [place], Betty G, MD  Allergies:   Allergies  Allergen Reactions  . Lovenox [Enoxaparin Sodium] Rash    rash    Medications: Outpatient Encounter Medications as of 04/21/2020  Medication Sig  . amLODipine-benazepril (LOTREL) 5-40 MG capsule Take 1 capsule by mouth daily.  . Cholecalciferol (VITAMIN D) 50 MCG (2000 UT) tablet Take 2,000 Units by mouth daily.  Marland Kitchen conjugated estrogens (PREMARIN) vaginal cream 1 applicatorful twice per week  . diclofenac sodium (VOLTAREN) 1 % GEL Apply topically 4 (four) times daily.  Marland Kitchen escitalopram (LEXAPRO) 10 MG tablet Take 1 tablet (10 mg total) by mouth daily.  Marland Kitchen estradiol (ESTRACE) 0.1 MG/GM vaginal cream Place 1 Applicatorful vaginally 2 (two) times a week.  . fluticasone (FLONASE) 50 MCG/ACT nasal spray Place into both nostrils as needed for allergies or rhinitis.  . fluticasone furoate-vilanterol (BREO ELLIPTA) 100-25 MCG/INH AEPB Inhale 1 puff into the lungs daily.  . hydrochlorothiazide (MICROZIDE) 12.5 MG capsule Take 12.5 mg by mouth daily.  . hydroxychloroquine (PLAQUENIL) 200 MG tablet Take 2 tablets (400 mg total) by mouth daily.  Marland Kitchen ibuprofen (ADVIL) 600 MG tablet Take 1 tablet (600 mg total) by mouth every 6 (six) hours as needed.  Marland Kitchen levothyroxine (SYNTHROID) 150 MCG tablet Take 1 tablet (150 mcg total) by mouth every other day.  . levothyroxine (SYNTHROID) 175 MCG tablet Take 1 tablet (175 mcg total) by mouth every other day.  . pravastatin (PRAVACHOL) 20 MG tablet Take 1 tablet (20 mg total) by mouth daily.   No facility-administered encounter medications on file  as of 04/21/2020.    Current Diagnosis: Patient Active Problem List   Diagnosis Date Noted  . Depression, major, single episode, mild ([place]) 02/17/2020  . Atrophic vaginitis 02/17/2020  . Rheumatoid arthritis of multiple sites with negative rheumatoid factor ([place]) 07/02/2019  . High risk medication use 07/02/2019  . Primary osteoarthritis of both hands 07/02/2019  . Status post total bilateral knee replacement 07/02/2019  . Status post right ankle joint replacement 07/02/2019  . Essential hypertension 07/02/2019  . History of asthma 07/02/2019  . History of thyroid cancer 07/02/2019  . Postoperative hypothyroidism 07/02/2019  . Dyslipidemia 07/02/2019  . Gastroesophageal reflux disease without esophagitis 07/02/2019  . Osteopenia of multiple sites 07/02/2019    Goals Addressed   None     Follow-Up:  Pharmacist Review    . Current Asthma regimen: o  Fluticasone furoate-vilanterol (Breo Ellipta)100-25 MCG/INH AEPB 1 puff daily  . No flowsheet data found. . Any recent hospitalizations or ED visits since last visit with CPP? Yes  o 02/07/2020 ED, Patient stated she tripped over her own two feet and cracked her rib. Patient stated she is doing better and she stopped taking the ibuprofen 600 MG tablet.  . Patient denies Asthma symptoms, including Increased shortness of breath , Rescue medicine is not helping, Shortness of breath at rest, Symptoms worse with exercise, Symptoms worse at night and Wheezing . What recent interventions/DTPs have been made by any provider to improve breathing since last visit: None ID  . Have you had exacerbation/flare-up since  last visit? No . What do you do when you are short of breath?  Other  o Patient stated she takes a deep breath and relax.  Respiratory Devices/Equipment . Do you have a nebulizer? No . Do you use a Peak Flow Meter? No . Do you use a maintenance inhaler? Yes . How often do you forget to use your daily inhaler? Never . Do you  use a rescue inhaler? No . How often do you use your rescue inhaler?  daily  o Patient stated the inhaler  Fluticasone furoate-vilanterol (Breo Ellipta)100-25 MCG/INH does a great job. Patient states "the one puff daily is all she needs to get through the day". . Do you use a spacer with your inhaler? No  Adherence Review: . Does the patient have >5 day gap between last estimated fill date for maintenance inhaler medications? Yes  [place] Pharmacist Assistant [phone]

## 2020-04-28 ENCOUNTER — Encounter: Payer: Self-pay | Admitting: Rheumatology

## 2020-04-28 ENCOUNTER — Encounter: Payer: Self-pay | Admitting: Family Medicine

## 2020-04-28 MED ORDER — HYDROXYCHLOROQUINE SULFATE 200 MG PO TABS: 400.0000 mg | ORAL_TABLET | Freq: Every day | ORAL | 0 refills | Status: DC

## 2020-04-28 NOTE — Telephone Encounter (Signed)
Last Visit: 01/27/2020 Next Visit: 06/29/2020 Labs: 01/27/2020 CBC and CMP are normal. Eye exam: 12/26/2019 WNL  Current Dose per office note 01/27/2020: PLQ 400 mg p.o. daily [id number] arthritis of multiple sites with negative rheumatoid factor   Okay to refill per Dr. Estanislado Pandy

## 2020-04-29 ENCOUNTER — Other Ambulatory Visit: Payer: Self-pay | Admitting: Family Medicine

## 2020-04-29 MED ORDER — BUDESONIDE-FORMOTEROL FUMARATE 160-4.5 MCG/ACT IN AERO
2.0000 | INHALATION_SPRAY | Freq: Two times a day (BID) | RESPIRATORY_TRACT | 3 refills | Status: DC
Start: 2020-04-29 — End: 2020-05-06

## 2020-05-04 ENCOUNTER — Encounter: Payer: Self-pay | Admitting: Family Medicine

## 2020-05-06 ENCOUNTER — Other Ambulatory Visit: Payer: Self-pay | Admitting: Family Medicine

## 2020-05-06 MED ORDER — AIRDUO DIGIHALER 113-14 MCG/ACT IN AEPB: 1.0000 | INHALATION_SPRAY | Freq: Two times a day (BID) | RESPIRATORY_TRACT | 2 refills | Status: DC

## 2020-05-11 ENCOUNTER — Encounter: Payer: Self-pay | Admitting: Family Medicine

## 2020-05-19 ENCOUNTER — Telehealth: Payer: Self-pay | Admitting: Family Medicine

## 2020-05-19 MED ORDER — FLUTICASONE-SALMETEROL 113-14 MCG/ACT IN AEPB: 1.0000 | INHALATION_SPRAY | Freq: Two times a day (BID) | RESPIRATORY_TRACT | 2 refills | Status: DC

## 2020-05-19 NOTE — Telephone Encounter (Signed)
Pharmacist needs the generic name and dosage written out to be filled..  She asked the pharmacist to call but they said they were not allowed.  Pt apologies for excessive calls  Please advise

## 2020-05-19 NOTE — Telephone Encounter (Signed)
Rx corrected to generic & sent into pharmacy.

## 2020-05-29 ENCOUNTER — Telehealth: Payer: Self-pay

## 2020-05-29 NOTE — Progress Notes (Signed)
Chronic Care Management Pharmacy Assistant   Name: Mercedes Hodge  MRN: [id number] DOB: 09/17/1955  Reason for Encounter: Hypertension  Disease State Call.   PCP : [place], Betty G, MD  Allergies:   Allergies  Allergen Reactions  . Lovenox [Enoxaparin Sodium] Rash    rash    Medications: Outpatient Encounter Medications as of 05/29/2020  Medication Sig  . amLODipine-benazepril (LOTREL) 5-40 MG capsule Take 1 capsule by mouth daily.  . Cholecalciferol (VITAMIN D) 50 MCG (2000 UT) tablet Take 2,000 Units by mouth daily.  . diclofenac sodium (VOLTAREN) 1 % GEL Apply topically 4 (four) times daily.  Marland Kitchen escitalopram (LEXAPRO) 10 MG tablet Take 1 tablet (10 mg total) by mouth daily.  Marland Kitchen estradiol (ESTRACE) 0.1 MG/GM vaginal cream Place 1 Applicatorful vaginally 2 (two) times a week.  . fluticasone (FLONASE) 50 MCG/ACT nasal spray Place into both nostrils as needed for allergies or rhinitis.  . Fluticasone-Salmeterol 113-14 MCG/ACT AEPB Inhale 1 puff into the lungs 2 (two) times daily.  . hydrochlorothiazide (MICROZIDE) 12.5 MG capsule Take 12.5 mg by mouth daily.  . hydroxychloroquine (PLAQUENIL) 200 MG tablet Take 2 tablets (400 mg total) by mouth daily.  Marland Kitchen levothyroxine (SYNTHROID) 150 MCG tablet Take 1 tablet (150 mcg total) by mouth every other day.  . levothyroxine (SYNTHROID) 175 MCG tablet Take 1 tablet (175 mcg total) by mouth every other day.  . pravastatin (PRAVACHOL) 20 MG tablet Take 1 tablet (20 mg total) by mouth daily.   No facility-administered encounter medications on file as of 05/29/2020.    Current Diagnosis: Patient Active Problem List   Diagnosis Date Noted  . Depression, major, single episode, mild ([place]) 02/17/2020  . Atrophic vaginitis 02/17/2020  . Rheumatoid arthritis of multiple sites with negative rheumatoid factor ([place]) 07/02/2019  . High risk medication use 07/02/2019  . Primary osteoarthritis of both hands 07/02/2019  . Status post total  bilateral knee replacement 07/02/2019  . Status post right ankle joint replacement 07/02/2019  . Essential hypertension 07/02/2019  . History of asthma 07/02/2019  . History of thyroid cancer 07/02/2019  . Postoperative hypothyroidism 07/02/2019  . Dyslipidemia 07/02/2019  . Gastroesophageal reflux disease without esophagitis 07/02/2019  . Osteopenia of multiple sites 07/02/2019     Follow-Up:  Pharmacist Review   Reviewed chart prior to disease state call. Spoke with patient regarding BP  Recent Office Vitals: BP Readings from Last 3 Encounters:  02/17/20 122/76  01/27/20 112/68  09/11/19 132/80   Pulse Readings from Last 3 Encounters:  02/17/20 71  02/07/20 64  01/27/20 78    Wt Readings from Last 3 Encounters:  02/17/20 223 lb (101.2 kg)  01/27/20 221 lb (100.2 kg)  09/11/19 217 lb (98.4 kg)      . Current antihypertensive regimen:  o Amlodipine-benazepril 5-40 MG Capsule o Hydrochlorothiazide 12.5 MG Capsule  . How often are you checking your Blood Pressure? Every three weeks. . Current home BP readings:  o On 03/25/2020 it was 114/61 o On 03/27/2020 it was 126/71 o On 03/28/2020 it was 116/81 . What recent interventions/DTPs have been made by any provider to improve Blood Pressure control since last CPP Visit: None ID . Any recent hospitalizations or ED visits since last visit with CPP? No . What diet changes have been made to improve Blood Pressure Control?  o Patient states she cooks at home, and add salt at the dinner table. . What exercise is being done to improve your Blood Pressure Control?  o Patient reports she has lost 8lbs since her last office visit with her PCP. o Patient states she exercise 3 times a week by walking and riding her bike.  Adherence Review: Is the patient currently on ACE/ARB medication? Yes Does the patient have >5 day gap between last estimated fill dates? Yes     [place] Pharmacist  Assistant [phone]    Maryjean Ka

## 2020-06-11 ENCOUNTER — Telehealth: Payer: Self-pay

## 2020-06-11 NOTE — Progress Notes (Signed)
    Chronic Care Management Pharmacy Assistant   Name: Yarelin Reichardt  MRN: [id number] DOB: 1956-01-11  Reason for Encounter: Medication Review   PCP : [place], Betty G, MD  Allergies:   Allergies  Allergen Reactions  . Lovenox [Enoxaparin Sodium] Rash    rash    Medications: Outpatient Encounter Medications as of 06/11/2020  Medication Sig  . amLODipine-benazepril (LOTREL) 5-40 MG capsule Take 1 capsule by mouth daily.  . Cholecalciferol (VITAMIN D) 50 MCG (2000 UT) tablet Take 2,000 Units by mouth daily.  . diclofenac sodium (VOLTAREN) 1 % GEL Apply topically 4 (four) times daily.  Marland Kitchen escitalopram (LEXAPRO) 10 MG tablet Take 1 tablet (10 mg total) by mouth daily.  Marland Kitchen estradiol (ESTRACE) 0.1 MG/GM vaginal cream Place 1 Applicatorful vaginally 2 (two) times a week.  . fluticasone (FLONASE) 50 MCG/ACT nasal spray Place into both nostrils as needed for allergies or rhinitis.  . Fluticasone-Salmeterol 113-14 MCG/ACT AEPB Inhale 1 puff into the lungs 2 (two) times daily.  . hydrochlorothiazide (MICROZIDE) 12.5 MG capsule Take 12.5 mg by mouth daily.  . hydroxychloroquine (PLAQUENIL) 200 MG tablet Take 2 tablets (400 mg total) by mouth daily.  Marland Kitchen levothyroxine (SYNTHROID) 150 MCG tablet Take 1 tablet (150 mcg total) by mouth every other day.  . levothyroxine (SYNTHROID) 175 MCG tablet Take 1 tablet (175 mcg total) by mouth every other day.  . pravastatin (PRAVACHOL) 20 MG tablet Take 1 tablet (20 mg total) by mouth daily.   No facility-administered encounter medications on file as of 06/11/2020.    Current Diagnosis: Patient Active Problem List   Diagnosis Date Noted  . Depression, major, single episode, mild ([place]) 02/17/2020  . Atrophic vaginitis 02/17/2020  . Rheumatoid arthritis of multiple sites with negative rheumatoid factor ([place]) 07/02/2019  . High risk medication use 07/02/2019  . Primary osteoarthritis of both hands 07/02/2019  . Status post total bilateral knee  replacement 07/02/2019  . Status post right ankle joint replacement 07/02/2019  . Essential hypertension 07/02/2019  . History of asthma 07/02/2019  . History of thyroid cancer 07/02/2019  . Postoperative hypothyroidism 07/02/2019  . Dyslipidemia 07/02/2019  . Gastroesophageal reflux disease without esophagitis 07/02/2019  . Osteopenia of multiple sites 07/02/2019    Goals Addressed   None     Follow-Up:  Pharmacist Review   Reviewed chart and adherence measures.Per insurance data patient is 98% to her amlodipine/Benazepril for hypertension and 98% adherent to her pravastatin for hyperlipidemia. Patient meets adherence for pravastatin and amlodipine/Benazepril.  [place] Pharmacist Assistant [phone]

## 2020-06-15 NOTE — Progress Notes (Signed)
Office Visit Note  Patient: Mercedes Hodge             Date of Birth: [birth year]           MRN: [id number]             PCP: Martinique, Betty G, MD Referring: Ronnald Nian, DO Visit Date: 06/29/2020 Occupation: @GUAROCC @  Subjective:  Joint stiffness.   History of Present Illness: Mercedes Hodge is a 64 y.o. female with history of rheumatoid arthritis and osteoarthritis overlap.  She states she has been doing well on Plaquenil.  She continues to have some pain and stiffness in her hands.  She states her right middle finger has been triggering.  She still have some left CMC discomfort.  She also has pain in her hips on both sides when she sleeps on her side at night.  Activities of Daily Living:  Patient reports morning stiffness for 15  minutes.   Patient Denies nocturnal pain.  Difficulty dressing/grooming: Denies Difficulty climbing stairs: Denies Difficulty getting out of chair: Denies Difficulty using hands for taps, buttons, cutlery, and/or writing: Denies  Review of Systems  Constitutional: Negative for fatigue.  HENT: Negative for mouth sores, mouth dryness and nose dryness.   Eyes: Negative for pain, itching and dryness.  Respiratory: Negative for shortness of breath and difficulty breathing.   Cardiovascular: Negative for chest pain and palpitations.  Gastrointestinal: Positive for abdominal pain, constipation and diarrhea. Negative for blood in stool.  Endocrine: Negative for increased urination.  Genitourinary: Negative for difficulty urinating and painful urination.  Musculoskeletal: Positive for morning stiffness. Negative for arthralgias, joint pain, joint swelling, myalgias, muscle tenderness and myalgias.  Skin: Negative for color change, rash and redness.  Allergic/Immunologic: Negative for susceptible to infections.  Neurological: Negative for dizziness, numbness, headaches, memory loss and weakness.  Hematological: Positive for bruising/bleeding  tendency.  Psychiatric/Behavioral: Negative for confusion and sleep disturbance.    PMFS History:  Patient Active Problem List   Diagnosis Date Noted  . Depression, major, single episode, mild ([place]) 02/17/2020  . Atrophic vaginitis 02/17/2020  . Rheumatoid arthritis of multiple sites with negative rheumatoid factor ([place]) 07/02/2019  . High risk medication use 07/02/2019  . Primary osteoarthritis of both hands 07/02/2019  . Status post total bilateral knee replacement 07/02/2019  . Status post right ankle joint replacement 07/02/2019  . Essential hypertension 07/02/2019  . History of asthma 07/02/2019  . History of thyroid cancer 07/02/2019  . Postoperative hypothyroidism 07/02/2019  . Dyslipidemia 07/02/2019  . Gastroesophageal reflux disease without esophagitis 07/02/2019  . Osteopenia of multiple sites 07/02/2019    Past Medical History:  Diagnosis Date  . Allergy   . Arthritis   . Asthma   . Cancer ([place])    thyroid  . GERD (gastroesophageal reflux disease)   . Hyperlipidemia   . Hypertension   . Thyroid disease    Postsurgical hypothyroidism    Family History  Problem Relation Age of Onset  . COPD Mother   . Heart disease Mother   . Cancer Father        thyroid cancer  . Juvenile Rhematoid Arthritis Sister   . Stroke Sister   . Thyroid cancer Brother   . Thyroid cancer Daughter   . Healthy Daughter   . Healthy Son   . Healthy Daughter    Past Surgical History:  Procedure Laterality Date  . ABDOMINAL HYSTERECTOMY  1993  . CHOLECYSTECTOMY  1982  . LAPAROSCOPIC GASTRIC SLEEVE  RESECTION  07/2018  . left knee replacement  2008  . right ankle replacement  2012  . right knee replacement  2009  . trimallar FX  1995   right ankle   Social History   Social History Narrative  . Not on file   Immunization History  Administered Date(s) Administered  . Influenza-Unspecified 05/22/2019  . PFIZER SARS-COV-2 Vaccination 11/17/2019, 12/08/2019, 06/10/2020  .  Pneumococcal Conjugate-13 06/07/2019  . Tdap 06/07/2019     Objective: Vital Signs: BP 135/81 (BP Location: Left Arm, Patient Position: Sitting, Cuff Size: Normal)   Pulse 64   Resp 16   Ht 5\' 7"  (1.702 m)   Wt 216 lb (98 kg)   BMI 33.83 kg/m    Physical Exam Vitals and nursing note reviewed.  Constitutional:      Appearance: She is well-developed.  HENT:     Head: Normocephalic and atraumatic.  Eyes:     Conjunctiva/sclera: Conjunctivae normal.  Cardiovascular:     Rate and Rhythm: Normal rate and regular rhythm.     Heart sounds: Normal heart sounds.  Pulmonary:     Effort: Pulmonary effort is normal.     Breath sounds: Normal breath sounds.  Abdominal:     General: Bowel sounds are normal.     Palpations: Abdomen is soft.  Musculoskeletal:     Cervical back: Normal range of motion.  Lymphadenopathy:     Cervical: No cervical adenopathy.  Skin:    General: Skin is warm and dry.     Capillary Refill: Capillary refill takes less than 2 seconds.  Neurological:     Mental Status: She is alert and oriented to person, place, and time.  Psychiatric:        Behavior: Behavior normal.      Musculoskeletal Exam: C-spine thoracic and lumbar spine with good range of motion.  Shoulder joints, elbow joints, wrist joints with good range of motion.  She has bilateral CMC prominence more so on the left side.  She has PIP and DIP thickening.  No synovitis was noted. Hip joints with good range of motion.  She has bilateral knee joint replacement with good range of motion.  Right ankle joint is replaced and has limited range of motion. CDAI Exam: CDAI Score: 0.4  Patient Global: 2 mm; Provider Global: 2 mm Swollen: 0 ; Tender: 0  Joint Exam 06/29/2020   No joint exam has been documented for this visit   There is currently no information documented on the homunculus. Go to the Rheumatology activity and complete the homunculus joint exam.  Investigation: No additional  findings.  Imaging: No results found.  Recent Labs: Lab Results  Component Value Date   WBC 6.9 01/27/2020   HGB 14.6 01/27/2020   PLT 193 01/27/2020   NA 139 01/27/2020   K 4.1 01/27/2020   CL 100 01/27/2020   CO2 28 01/27/2020   GLUCOSE 86 01/27/2020   BUN 17 01/27/2020   CREATININE 0.88 01/27/2020   BILITOT 0.6 01/27/2020   AST 24 01/27/2020   ALT 20 01/27/2020   PROT 6.7 01/27/2020   CALCIUM 9.5 01/27/2020   GFRAA 79 01/27/2020    Speciality Comments: Plaquenil eye exam: 12/26/2019 WNL Follow up in 1 year  Procedures:  No procedures performed Allergies: Lovenox [enoxaparin sodium]   Assessment / Plan:     Visit Diagnoses: Rheumatoid arthritis of multiple sites with negative rheumatoid factor (HCC) - Positive anti-CCP (repeat negative) and elevated CRP, Diagnosed in [place] by  her rheumatologist. Hx of inflammatory arthritis.  She had no synovitis on examination today.  She has been tolerating Plaquenil well.  High risk medication use - PLQ 400 mg p.o. daily. Plaquenil eye exam: 12/26/2019  - Plan: CBC with Differential/Platelet, COMPLETE METABOLIC PANEL WITH GFR today and then every 5 months.  Primary osteoarthritis of both hands-trend protection muscle strengthening was discussed.  PIP and DIP  prominence was noted.  Her left CMC joint has been bothersome.  Left CMC brace was discussed.  Trigger finger, right middle finger-she is having triggering of her left middle finger.  She does not want an injection.  I discussed the use of Voltaren gel.  Side effects were reviewed.  Trochanteric bursitis of both hips-she had tenderness over bilateral trochanteric bursa.  I have given her a handout on IT band exercises.  If she has persistent symptoms we can refer her to physical therapy.  Patient will let me know.  Status post total bilateral knee replacement-doing well.  Status post right ankle joint replacement - History of Charcot joint which required total ankle  replacement.  Osteopenia of multiple sites -I reviewed DEXA from May 2021 which showed a T score of -1.1.  Use of calcium, vitamin D and resistive exercises were discussed.  Essential hypertension-blood pressure is well controlled.  History of asthma  History of thyroid cancer  Dyslipidemia  Gastroesophageal reflux disease without esophagitis - She takes Protonix on as needed basis.  Postoperative hypothyroidism  Educated about COVID-19 virus infection-she is fully vaccinated against COVID-19 and also received a booster.  Use of mask, social distancing and hand hygiene was discussed.  I also discussed the use of monoclonal antibodies in case she develops COVID-19 infection.  Orders: Orders Placed This Encounter  Procedures  . CBC with Differential/Platelet  . COMPLETE METABOLIC PANEL WITH GFR   No orders of the defined types were placed in this encounter.    Follow-Up Instructions: Return in about 5 months (around 11/27/2020) for Rheumatoid arthritis.   Bo Merino, MD  Note - This record has been created using Editor, commissioning.  Chart creation errors have been sought, but may not always  have been located. Such creation errors do not reflect on  the standard of medical care.

## 2020-06-19 ENCOUNTER — Telehealth: Payer: Self-pay

## 2020-06-19 NOTE — Progress Notes (Signed)
Spoke to patient to confirmed patient telephone appointment on 06/22/2020 for CCM at 10:30 with Junius Argyle the Clinical pharmacist.   Patient verbalized understanding.    [place] Pharmacist Assistant [phone]

## 2020-06-22 ENCOUNTER — Ambulatory Visit: Payer: Medicare HMO

## 2020-06-22 DIAGNOSIS — I10 Essential (primary) hypertension: Secondary | ICD-10-CM

## 2020-06-22 DIAGNOSIS — Z8709 Personal history of other diseases of the respiratory system: Secondary | ICD-10-CM

## 2020-06-22 NOTE — Chronic Care Management (AMB) (Signed)
Chronic Care Management Pharmacy  Name: Mercedes Hodge  MRN: [id number] DOB: 1956/03/30  Chief Complaint/ HPI  Mercedes Hodge,  64 y.o. , female presents for their Follow-Up CCM visit with the clinical pharmacist via telephone.  PCP : Hodge, [name], MD  Their chronic conditions include: hypertension, asthma, dyslipidemia, rheumatoid arthritis, hypothyroidism, GERD, and Osteopenia  Office Visits: 05/06/20: Patient switched to AirDuo Degihaler due to formulary cost concerns.  02/17/20: Patient presented to Dr. Martinique to establish care. Patient started on estradiol vaginal cream due to cheaper cost compared to Premarin.  09/11/19: Video visit with Dr. Bryan Lemma for follow-up. Patient stable, doing well. No medication changes made.   Consult Visit: 02/07/20: Patient presented to ED following rib fracture following fall.  09/27/19: Video Visit with Dr. Bo Merino (rehumatology) for rheumatoid arthritis follow-up. Patient with joint pain in left hand, otherwise doing well. Follow-up in 4 months   Medications: Outpatient Encounter Medications as of 06/22/2020  Medication Sig   amLODipine-benazepril (LOTREL) 5-40 MG capsule Take 1 capsule by mouth daily.   Cholecalciferol (VITAMIN D) 50 MCG (2000 UT) tablet Take 2,000 Units by mouth daily.   diclofenac sodium (VOLTAREN) 1 % GEL Apply topically 4 (four) times daily.   escitalopram (LEXAPRO) 10 MG tablet Take 1 tablet (10 mg total) by mouth daily.   estradiol (ESTRACE) 0.1 MG/GM vaginal cream Place 1 Applicatorful vaginally 2 (two) times a week.   fluticasone (FLONASE) 50 MCG/ACT nasal spray Place into both nostrils as needed for allergies or rhinitis.   Fluticasone-Salmeterol 113-14 MCG/ACT AEPB Inhale 1 puff into the lungs 2 (two) times daily.   hydrochlorothiazide (MICROZIDE) 12.5 MG capsule Take 12.5 mg by mouth daily.   hydroxychloroquine (PLAQUENIL) 200 MG tablet Take 2 tablets (400 mg total) by mouth daily.    levothyroxine (SYNTHROID) 150 MCG tablet Take 1 tablet (150 mcg total) by mouth every other day.   levothyroxine (SYNTHROID) 175 MCG tablet Take 1 tablet (175 mcg total) by mouth every other day.   pravastatin (PRAVACHOL) 20 MG tablet Take 1 tablet (20 mg total) by mouth daily.   No facility-administered encounter medications on file as of 06/22/2020.     Current Diagnosis/Assessment:  SDOH Interventions     Most Recent Value  SDOH Interventions  Financial Strain Interventions Intervention Not Indicated  Transportation Interventions Intervention Not Indicated     Goals Addressed            This Visit's Progress    Chronic Care Management.   On track    CARE PLAN ENTRY  Current Barriers:   Chronic Disease Management support, education, and care coordination needs related to  hypertension, asthma, dyslipidemia, rheumatoid arthritis, hypothyroidism, GERD  Clinical Goal(s): Over the next 180 days, patient will:   Work with the care management team to address educational, disease management, and care coordination needs   Begin or continue self health monitoring activities as directed today   Call provider office for new or worsened signs and symptoms   Call care management team with questions or concerns  Maintain blood pressure less than 130/80  Maintain LDL (bad cholesterol) less than 100   Interventions:   Evaluation of current treatment plans and patient's adherence to plan as established by provider  Assessed patient understanding of disease states  Assessed patient's education and care coordination needs  Provided disease specific education to patient        Asthma    Eosinophil count:   Lab Results  Component Value  Date/Time   EOSPCT 2.5 01/27/2020 10:29 AM  %                               Eos (Absolute):  Lab Results  Component Value Date/Time   EOSABS 173 01/27/2020 10:29 AM    Tobacco Status:  Social History   Tobacco Use  Smoking  Status Never Smoker  Smokeless Tobacco Never Used    Patient has failed these meds in past: Breo (Cost)  Patient is currently controlled on the following medications:   AirDuo Fluticasone 113-14 mcg/act 1 puff twice daily ($67 monthly)    Using maintenance inhaler regularly? Yes Frequency of rescue inhaler use:  never  We discussed:  proper inhaler technique. Patient tolerating AirDuo inhaler well.   Plan  Continue current medications   Hypertension   BP today is:  <130/80  Office blood pressures are  BP Readings from Last 3 Encounters:  02/17/20 122/76  01/27/20 112/68  09/11/19 132/80   CMP Latest Ref Rng & Units 01/27/2020 07/05/2019 10/17/2016  Glucose 65 - 99 mg/dL 86 93 -  BUN 7 - 25 mg/dL '17 20 16  ' Creatinine 0.50 - 0.99 mg/dL 0.88 0.85 0.6  Sodium 135 - 146 mmol/L 139 137 139  Potassium 3.5 - 5.3 mmol/L 4.1 3.8 4.5  Chloride 98 - 110 mmol/L 100 101 -  CO2 20 - 32 mmol/L 28 28 -  Calcium 8.6 - 10.4 mg/dL 9.5 9.0 -  Total Protein 6.1 - 8.1 g/dL 6.7 6.7 -  Total Bilirubin 0.2 - 1.2 mg/dL 0.6 0.6 -  AST 10 - 35 U/L 24 21 -  ALT 6 - 29 U/L '20 14 22   ' Patient has failed these meds in the past: n/a Patient is currently controlled on the following medications:  Amlodipine-benazepril 5-40 mg daily   HCTZ 12.5 mg daily   Patient checks BP at home 1-2x per week  Patient home BP readings are ranging: 120/68 at dentist's office, 110/60s  Typically at home.   We discussed diet and exercise extensively Does not snack often. Gastric sleeve in 2019. Does salt food, but otherwise low-salt sodium. Trying to increase distance she walking. 3-4 times weekly. Has stationary bike, uses sporadically. Thinking about joining a local gym, but has been hesitant because of Covid-19.  Plan  Recommend switching amlodipine-olmesartan 5-40 mg daily to amlodipine-benazapril 5-40 mg daily as this is a Tier 1 medication under her insurance and would save her a significant amount of money.    Hyperlipidemia   Lipid Panel     Component Value Date/Time   CHOL 188 02/17/2020 0950   TRIG 74.0 02/17/2020 0950   HDL 73.20 02/17/2020 0950   CHOLHDL 3 02/17/2020 0950   VLDL 14.8 02/17/2020 0950   LDLCALC 100 (H) 02/17/2020 0950     The 10-year ASCVD risk score Mikey Bussing DC Jr., et al., 2013) is: 6.6%   Values used to calculate the score:     Age: 6 years     Sex: Female     Is Non-Hispanic African American: No     Diabetic: No     Tobacco smoker: No     Systolic Blood Pressure: 440 mmHg     Is BP treated: Yes     HDL Cholesterol: 73.2 mg/dL     Total Cholesterol: 188 mg/dL   Patient has failed these meds in past: n/a Patient is currently controlled on the following medications:  Pravastatin 20 mg daily  LDL goal <100  We discussed:  diet and exercise extensively  Plan  Continue current medications  Recommend fasting lipid panel  Hypothyroidism   Lab Results  Component Value Date/Time   TSH 0.62 02/17/2020 09:50 AM    Patient has failed these meds in past: n/a Patient is currently controlled on the following medications:   Levothyroxine 150 mcg every other day   Levothyroxine 175 mcg every other day   We discussed:  Separates from other medications/food by at least 30 minutes.   Plan  Continue current medications  Rheumatoid Arthritis    Managed by Dr. Bo Merino  CBC Latest Ref Rng & Units 01/27/2020 07/05/2019  WBC 3.8 - 10.8 Thousand/uL 6.9 8.0  Hemoglobin 11.7 - 15.5 g/dL 14.6 13.8  Hematocrit 35 - 45 % 43.0 40.9  Platelets 140 - 400 Thousand/uL 193 204  ESR (07/05/19): 6 Last eye exam: March 2020  Patient has failed these meds in past: n/a Patient is currently controlled on the following medications:   Hydroxychloroquine 400 mg daily    We discussed:  Patient feels hydroxychloroquine effectively managing her pain. She occasionally still has pain in hand when she has been using her hands (craft projects). Voltaren gel helps relieve  her symptoms when she has breakthrough pain.  Plan  Continue current medications  Depression    PHQ9 Score:  PHQ9 SCORE ONLY 09/11/2019  PHQ-9 Total Score 0   GAD7 Score: GAD 7 : Generalized Anxiety Score 09/11/2019  Nervous, Anxious, on Edge 0  Control/stop worrying 0  Worry too much - different things 0  Trouble relaxing 0  Restless 0  Easily annoyed or irritable 0  Afraid - awful might happen 0  Total GAD 7 Score 0    Patient has failed these meds in past: n/a Patient is currently controlled on the following medications:   Escitalopram 10 mg daily   Plan  Continue current medications  GERD   Patient has failed these meds in past: n/a Patient is currently controlled on the following medications:   Pantoprazole 40 mg daily PRN (hasn't used in 12 months)  Pepcid 20 mg PRN (uses once per 3 weeks)   We discussed:  diet and exercise extensively. Rarely has reflux symptoms, typically if only eating spicy or acidic foods ([place])  Plan  Continue current medications  Osteopenia    Fragility Fracture May 2021  Last DEXA Scan: 02/04/20   T-Score femoral neck: -0.9  T-Score total hip: -0.7  T-Score lumbar spine: -1.1  T-Score forearm radius: n/a  10-year probability of major osteoporotic fracture: 9.5%  10-year probability of hip fracture: 0.7%  No results found for: VD25OH   Patient is not a candidate for pharmacologic treatment  Patient has failed these meds in past: n/a Patient is currently controlled on the following medications:   Vitamin D3 2000 units daily  Calcium 500 mg once daily   We discussed:  Recommend [phone] units of vitamin D daily. Recommend 1200 mg of calcium daily from dietary and supplemental sources.  Plan  Continue current medications  Misc/OTC    Estradiol 1% vaginal cream  Voltaren 1% gel (uses 1x weekly) Flonase 50 mcg/spray PRN (using 2 sprays daily)   Plan  Continue current medications  Vaccines   Reviewed and  discussed patient's vaccination history.    Immunization History  Administered Date(s) Administered   Influenza-Unspecified 05/22/2019   Pneumococcal Conjugate-13 06/07/2019   Tdap 06/07/2019    Medication Management  Pt uses [facility] mail order pharmacy for all medications. Uses pill box which she fills herself.    Follow up: 12 month telephone visit   [facility] [place]

## 2020-06-25 ENCOUNTER — Other Ambulatory Visit: Payer: Self-pay | Admitting: Rheumatology

## 2020-06-25 ENCOUNTER — Other Ambulatory Visit: Payer: Self-pay | Admitting: Family Medicine

## 2020-06-25 DIAGNOSIS — F325 Major depressive disorder, single episode, in full remission: Secondary | ICD-10-CM

## 2020-06-29 ENCOUNTER — Encounter: Payer: Self-pay | Admitting: Physician Assistant

## 2020-06-29 ENCOUNTER — Other Ambulatory Visit: Payer: Self-pay

## 2020-06-29 ENCOUNTER — Encounter: Payer: Self-pay | Admitting: Rheumatology

## 2020-06-29 ENCOUNTER — Ambulatory Visit: Payer: Medicare HMO | Admitting: Rheumatology

## 2020-06-29 VITALS — BP 135/81 | HR 64 | Resp 16 | Ht 67.0 in | Wt 216.0 lb

## 2020-06-29 DIAGNOSIS — M8589 Other specified disorders of bone density and structure, multiple sites: Secondary | ICD-10-CM | POA: Diagnosis not present

## 2020-06-29 DIAGNOSIS — Z96661 Presence of right artificial ankle joint: Secondary | ICD-10-CM

## 2020-06-29 DIAGNOSIS — M7061 Trochanteric bursitis, right hip: Secondary | ICD-10-CM

## 2020-06-29 DIAGNOSIS — M19041 Primary osteoarthritis, right hand: Secondary | ICD-10-CM

## 2020-06-29 DIAGNOSIS — Z96653 Presence of artificial knee joint, bilateral: Secondary | ICD-10-CM | POA: Diagnosis not present

## 2020-06-29 DIAGNOSIS — I10 Essential (primary) hypertension: Secondary | ICD-10-CM

## 2020-06-29 DIAGNOSIS — M65331 Trigger finger, right middle finger: Secondary | ICD-10-CM

## 2020-06-29 DIAGNOSIS — M19042 Primary osteoarthritis, left hand: Secondary | ICD-10-CM

## 2020-06-29 DIAGNOSIS — E785 Hyperlipidemia, unspecified: Secondary | ICD-10-CM

## 2020-06-29 DIAGNOSIS — M0609 Rheumatoid arthritis without rheumatoid factor, multiple sites: Secondary | ICD-10-CM

## 2020-06-29 DIAGNOSIS — Z85850 Personal history of malignant neoplasm of thyroid: Secondary | ICD-10-CM

## 2020-06-29 DIAGNOSIS — M7062 Trochanteric bursitis, left hip: Secondary | ICD-10-CM

## 2020-06-29 DIAGNOSIS — Z8709 Personal history of other diseases of the respiratory system: Secondary | ICD-10-CM

## 2020-06-29 DIAGNOSIS — Z79899 Other long term (current) drug therapy: Secondary | ICD-10-CM | POA: Diagnosis not present

## 2020-06-29 DIAGNOSIS — K219 Gastro-esophageal reflux disease without esophagitis: Secondary | ICD-10-CM

## 2020-06-29 DIAGNOSIS — E890 Postprocedural hypothyroidism: Secondary | ICD-10-CM

## 2020-06-29 DIAGNOSIS — Z7189 Other specified counseling: Secondary | ICD-10-CM

## 2020-06-29 NOTE — Patient Instructions (Signed)
Iliotibial Band Syndrome Rehab Ask your health care provider which exercises are safe for you. Do exercises exactly as told by your health care provider and adjust them as directed. It is normal to feel mild stretching, pulling, tightness, or discomfort as you do these exercises. Stop right away if you feel sudden pain or your pain gets significantly worse. Do not begin these exercises until told by your health care provider. Stretching and range-of-motion exercises These exercises warm up your muscles and joints and improve the movement and flexibility of your hip and pelvis. Quadriceps stretch, prone  1. Lie on your abdomen on a firm surface, such as a bed or padded floor (prone position). 2. Bend your left / right knee and reach back to hold your ankle or pant leg. If you cannot reach your ankle or pant leg, loop a belt around your foot and grab the belt instead. 3. Gently pull your heel toward your buttocks. Your knee should not slide out to the side. You should feel a stretch in the front of your thigh and knee (quadriceps). 4. Hold this position for __________ seconds. Repeat __________ times. Complete this exercise __________ times a day. Iliotibial band stretch An iliotibial band is a strong band of muscle tissue that runs from the outer side of your hip to the outer side of your thigh and knee. 1. Lie on your side with your left / right leg in the top position. 2. Bend both of your knees and grab your left / right ankle. Stretch out your bottom arm to help you balance. 3. Slowly bring your top knee back so your thigh goes behind your trunk. 4. Slowly lower your top leg toward the floor until you feel a gentle stretch on the outside of your left / right hip and thigh. If you do not feel a stretch and your knee will not fall farther, place the heel of your other foot on top of your knee and pull your knee down toward the floor with your foot. 5. Hold this position for __________  seconds. Repeat __________ times. Complete this exercise __________ times a day. Strengthening exercises These exercises build strength and endurance in your hip and pelvis. Endurance is the ability to use your muscles for a long time, even after they get tired. Straight leg raises, side-lying This exercise strengthens the muscles that rotate the leg at the hip and move it away from your body (hip abductors). 1. Lie on your side with your left / right leg in the top position. Lie so your head, shoulder, hip, and knee line up. You may bend your bottom knee to help you balance. 2. Roll your hips slightly forward so your hips are stacked directly over each other and your left / right knee is facing forward. 3. Tense the muscles in your outer thigh and lift your top leg 4-6 inches (10-15 cm). 4. Hold this position for __________ seconds. 5. Slowly return to the starting position. Let your muscles relax completely before doing another repetition. Repeat __________ times. Complete this exercise __________ times a day. Leg raises, prone This exercise strengthens the muscles that move the hips (hip extensors). 1. Lie on your abdomen on your bed or a firm surface. You can put a pillow under your hips if that is more comfortable for your lower back. 2. Bend your left / right knee so your foot is straight up in the air. 3. Squeeze your buttocks muscles and lift your left / right thigh   off the bed. Do not let your back arch. 4. Tense your thigh muscle as hard as you can without increasing any knee pain. 5. Hold this position for __________ seconds. 6. Slowly lower your leg to the starting position and allow it to relax completely. Repeat __________ times. Complete this exercise __________ times a day. Hip hike 1. Stand sideways on a bottom step. Stand on your left / right leg with your other foot unsupported next to the step. You can hold on to the railing or wall for balance if needed. 2. Keep your knees  straight and your torso square. Then lift your left / right hip up toward the ceiling. 3. Slowly let your left / right hip lower toward the floor, past the starting position. Your foot should get closer to the floor. Do not lean or bend your knees. Repeat __________ times. Complete this exercise __________ times a day. This information is not intended to replace advice given to you by your health care provider. Make sure you discuss any questions you have with your health care provider. Document Revised: 12/20/2018 Document Reviewed: 06/20/2018 Elsevier Patient Education  2020 [employer]  

## 2020-06-30 ENCOUNTER — Telehealth: Payer: Self-pay | Admitting: *Deleted

## 2020-06-30 LAB — CBC WITH DIFFERENTIAL/PLATELET
Absolute Monocytes: 814 cells/uL (ref 200–950)
Basophils Absolute: 52 cells/uL (ref 0–200)
Basophils Relative: 0.7 %
Eosinophils Absolute: 178 cells/uL (ref 15–500)
Eosinophils Relative: 2.4 %
HCT: 40.2 % (ref 35.0–45.0)
Hemoglobin: 13.7 g/dL (ref 11.7–15.5)
Lymphs Abs: 1635 cells/uL (ref 850–3900)
MCH: 32.2 pg (ref 27.0–33.0)
MCHC: 34.1 g/dL (ref 32.0–36.0)
MCV: 94.6 fL (ref 80.0–100.0)
MPV: 10.1 fL (ref 7.5–12.5)
Monocytes Relative: 11 %
Neutro Abs: 4721 cells/uL (ref 1500–7800)
Neutrophils Relative %: 63.8 %
Platelets: 179 10*3/uL (ref 140–400)
RBC: 4.25 10*6/uL (ref 3.80–5.10)
RDW: 12.5 % (ref 11.0–15.0)
Total Lymphocyte: 22.1 %
WBC: 7.4 10*3/uL (ref 3.8–10.8)

## 2020-06-30 LAB — COMPLETE METABOLIC PANEL WITH GFR
AG Ratio: 1.6 (calc) (ref 1.0–2.5)
ALT: 13 U/L (ref 6–29)
AST: 19 U/L (ref 10–35)
Albumin: 3.9 g/dL (ref 3.6–5.1)
Alkaline phosphatase (APISO): 51 U/L (ref 37–153)
BUN: 15 mg/dL (ref 7–25)
CO2: 28 mmol/L (ref 20–32)
Calcium: 9.3 mg/dL (ref 8.6–10.4)
Chloride: 102 mmol/L (ref 98–110)
Creat: 0.74 mg/dL (ref 0.50–0.99)
GFR, Est African American: 98 mL/min/{1.73_m2} (ref >=60)
GFR, Est Non African American: 84 mL/min/{1.73_m2} (ref >=60)
Globulin: 2.5 g/dL (calc) (ref 1.9–3.7)
Glucose, Bld: 85 mg/dL (ref 65–99)
Potassium: 3.6 mmol/L (ref 3.5–5.3)
Sodium: 137 mmol/L (ref 135–146)
Total Bilirubin: 0.9 mg/dL (ref 0.2–1.2)
Total Protein: 6.4 g/dL (ref 6.1–8.1)

## 2020-06-30 MED ORDER — HYDROXYCHLOROQUINE SULFATE 200 MG PO TABS: 400.0000 mg | ORAL_TABLET | Freq: Every day | ORAL | 0 refills | Status: DC

## 2020-06-30 NOTE — Progress Notes (Signed)
CBC and CMP normal

## 2020-06-30 NOTE — Telephone Encounter (Signed)
Last Visit: 06/29/2020 Next Visit: 11/27/2020 Labs: 06/29/2020 WNL Eye exam: 12/26/2019 WNL  Current Dose per office note 06/29/2020:  PLQ 400 mg p.o. daily DX: Rheumatoid arthritis of multiple sites with negative rheumatoid factor   Okay to refill per Dr.Deveshwar

## 2020-07-09 ENCOUNTER — Other Ambulatory Visit: Payer: Self-pay | Admitting: Family Medicine

## 2020-07-17 DIAGNOSIS — D1801 Hemangioma of skin and subcutaneous tissue: Secondary | ICD-10-CM | POA: Diagnosis not present

## 2020-07-17 DIAGNOSIS — D2261 Melanocytic nevi of right upper limb, including shoulder: Secondary | ICD-10-CM | POA: Diagnosis not present

## 2020-07-17 DIAGNOSIS — L918 Other hypertrophic disorders of the skin: Secondary | ICD-10-CM | POA: Diagnosis not present

## 2020-07-17 DIAGNOSIS — D225 Melanocytic nevi of trunk: Secondary | ICD-10-CM | POA: Diagnosis not present

## 2020-07-17 DIAGNOSIS — L821 Other seborrheic keratosis: Secondary | ICD-10-CM | POA: Diagnosis not present

## 2020-07-20 ENCOUNTER — Ambulatory Visit: Payer: Medicare HMO | Admitting: Physician Assistant

## 2020-07-20 ENCOUNTER — Encounter: Payer: Self-pay | Admitting: Physician Assistant

## 2020-07-20 VITALS — BP 102/72 | HR 68 | Ht 67.0 in | Wt 210.2 lb

## 2020-07-20 DIAGNOSIS — K5790 Diverticulosis of intestine, part unspecified, without perforation or abscess without bleeding: Secondary | ICD-10-CM

## 2020-07-20 DIAGNOSIS — Z86010 Personal history of colonic polyps: Secondary | ICD-10-CM

## 2020-07-20 DIAGNOSIS — R194 Change in bowel habit: Secondary | ICD-10-CM

## 2020-07-20 DIAGNOSIS — R1030 Lower abdominal pain, unspecified: Secondary | ICD-10-CM

## 2020-07-20 MED ORDER — HYOSCYAMINE SULFATE 0.125 MG SL SUBL: SUBLINGUAL_TABLET | SUBLINGUAL | 3 refills | Status: DC

## 2020-07-20 NOTE — Patient Instructions (Signed)
If you are age 66 or older, your body mass index should be between 23-30. Your Body mass index is 32.92 kg/m. If this is out of the aforementioned range listed, please consider follow up with your Primary Care Provider.  If you are age 45 or younger, your body mass index should be between 19-25. Your Body mass index is 32.92 kg/m. If this is out of the aformentioned range listed, please consider follow up with your Primary Care Provider.   You have been scheduled for a CT scan of the abdomen and pelvis at [facility], 1st floor Radiology. You are scheduled on 07/29/20  at 8:00 am. You should arrive 15 minutes prior to your appointment time for registration.  Please pick up 2 bottles of contrast from [place] at least 3 days prior to your scan. The solution may taste better if refrigerated, but do NOT add ice or any other liquid to this solution. Shake well before drinking.   Please follow the written instructions below on the day of your exam:   1) Do not eat anything after 4:00 am (4 hours prior to your test)   2) Drink 1 bottle of contrast @ 6:00 am (2 hours prior to your exam)  Remember to shake well before drinking and do NOT pour over ice.     Drink 1 bottle of contrast @ 7:00 am (1 hour prior to your exam)   You may take any medications as prescribed with a small amount of water, if necessary. If you take any of the following medications: METFORMIN, GLUCOPHAGE, GLUCOVANCE, AVANDAMET, RIOMET, FORTAMET, [place] MET, JANUMET, GLUMETZA or METAGLIP, you MAY be asked to HOLD this medication 48 hours AFTER the exam.   The purpose of you drinking the oral contrast is to aid in the visualization of your intestinal tract. The contrast solution may cause some diarrhea. Depending on your individual set of symptoms, you may also receive an intravenous injection of x-ray contrast/dye. Plan on being at [facility] for 45 minutes or longer, depending on the type of exam you are having performed.    If you have any questions regarding your exam or if you need to reschedule, you may call [facility] at [phone] between the hours of 8:00 am and 5:00 pm, Monday-Friday.   START Levsin dissolve 1 tablet on the tongue every 4-6 hours as needed for abdominal  Cramping/Diarrhea.  Follow up pending your CT

## 2020-07-20 NOTE — Progress Notes (Signed)
Subjective:    Patient ID: Mercedes Hodge, female    DOB: 11-08-1955, 64 y.o.   MRN: [id number]  HPI Mercedes Hodge is a pleasant 64 year old white female, new to GI today, self-referred for evaluation of change in bowel habits and lower abdominal pain. She has had prior GI evaluation in [place], and believes her last colonoscopy was in 2017.  She does have history of colon polyps and diverticulosis but has not had diverticulitis. She is status post gastric sleeve in 2019, remote cholecystectomy, hysterectomy, has history of thyroid CA, rheumatoid arthritis GERD and hypertension. Patient says her current symptoms started about 6 months ago and have progressed.  She has lost about 8 pounds over the past 4 to 6 weeks unintentionally.  She says her weight had leveled off post gastric sleeve and she had not changed her diet etc. She has developed episodes of what she describes as severe constipation associated with abdominal bloating and gas which may be present for a day or 2 without any bowel movement and then develops significant diarrhea with several episodes.  She feels that she is having these episodes of diarrhea at least 3 times per week.  She has noted some hematochezia which she attributes to hemorrhoids with red blood primarily just on the tissue.  She has had some nausea with the bloating.  No fever or chills. About 2 weeks ago she had a particularly bad episode with abdominal pain across the lower abdomen which lasted for 24 hours or so and then gradually resolved. She has never had any previous similar symptoms. She did have labs done through her rheumatologist 06/29/2020 with normal CBC and c-Met..  Review of Systems Pertinent positive and negative review of systems were noted in the above HPI section.  All other review of systems was otherwise negative.  Outpatient Encounter Medications as of 07/20/2020  Medication Sig  . amLODipine-benazepril (LOTREL) 5-40 MG capsule Take 1 capsule  by mouth daily.  . Cholecalciferol (VITAMIN D) 50 MCG (2000 UT) tablet Take 2,000 Units by mouth daily.  . diclofenac sodium (VOLTAREN) 1 % GEL Apply topically 4 (four) times daily.  Marland Kitchen escitalopram (LEXAPRO) 10 MG tablet Take 1 tablet (10 mg total) by mouth daily.  Marland Kitchen estradiol (ESTRACE) 0.1 MG/GM vaginal cream Place 1 Applicatorful vaginally 2 (two) times a week.  . fluticasone (FLONASE) 50 MCG/ACT nasal spray Place into both nostrils as needed for allergies or rhinitis.  . Fluticasone-Salmeterol 113-14 MCG/ACT AEPB Inhale 1 puff into the lungs 2 (two) times daily.  . hydrochlorothiazide (HYDRODIURIL) 12.5 MG tablet TAKE 1 TABLET EVERY DAY  . hydroxychloroquine (PLAQUENIL) 200 MG tablet Take 2 tablets (400 mg total) by mouth daily.  Marland Kitchen levothyroxine (SYNTHROID) 150 MCG tablet Take 1 tablet (150 mcg total) by mouth every other day.  . levothyroxine (SYNTHROID) 175 MCG tablet Take 1 tablet (175 mcg total) by mouth every other day.  . pravastatin (PRAVACHOL) 20 MG tablet Take 1 tablet (20 mg total) by mouth daily.  . hyoscyamine (LEVSIN SL) 0.125 MG SL tablet Dissolve 1 tablet on the tongue every 4-6 hours and needed for abdominal cramping/diarrhea  . [DISCONTINUED] hydrochlorothiazide (MICROZIDE) 12.5 MG capsule Take 12.5 mg by mouth daily.   No facility-administered encounter medications on file as of 07/20/2020.   Allergies  Allergen Reactions  . Lovenox [Enoxaparin Sodium] Rash    rash   Patient Active Problem List   Diagnosis Date Noted  . History of colonic polyps 07/20/2020  . Depression, major, single episode,  mild ([place]) 02/17/2020  . Atrophic vaginitis 02/17/2020  . Rheumatoid arthritis of multiple sites with negative rheumatoid factor ([place]) 07/02/2019  . High risk medication use 07/02/2019  . Primary osteoarthritis of both hands 07/02/2019  . Status post total bilateral knee replacement 07/02/2019  . Status post right ankle joint replacement 07/02/2019  . Essential hypertension  07/02/2019  . History of asthma 07/02/2019  . History of thyroid cancer 07/02/2019  . Postoperative hypothyroidism 07/02/2019  . Dyslipidemia 07/02/2019  . Gastroesophageal reflux disease without esophagitis 07/02/2019  . Osteopenia of multiple sites 07/02/2019   Social History   Socioeconomic History  . Marital status: Married    Spouse name: Not on file  . Number of children: Not on file  . Years of education: Not on file  . Highest education level: Not on file  Occupational History  . Not on file  Tobacco Use  . Smoking status: Never Smoker  . Smokeless tobacco: Never Used  Vaping Use  . Vaping Use: Never used  Substance and Sexual Activity  . Alcohol use: Not Currently    Comment: occ wine   . Drug use: Never  . Sexual activity: Yes    Birth control/protection: None  Other Topics Concern  . Not on file  Social History Narrative  . Not on file   Social Determinants of Health   Financial Resource Strain: Low Risk   . Difficulty of Paying Living Expenses: Not hard at all  Food Insecurity:   . Worried About Charity fundraiser in the Last Year: Not on file  . Ran Out of Food in the Last Year: Not on file  Transportation Needs: No Transportation Needs  . Lack of Transportation (Medical): No  . Lack of Transportation (Non-Medical): No  Physical Activity:   . Days of Exercise per Week: Not on file  . Minutes of Exercise per Session: Not on file  Stress:   . Feeling of Stress : Not on file  Social Connections:   . Frequency of Communication with Friends and Family: Not on file  . Frequency of Social Gatherings with Friends and Family: Not on file  . Attends Religious Services: Not on file  . Active Member of Clubs or Organizations: Not on file  . Attends Archivist Meetings: Not on file  . Marital Status: Not on file  Intimate Partner Violence:   . Fear of Current or Ex-Partner: Not on file  . Emotionally Abused: Not on file  . Physically Abused: Not  on file  . Sexually Abused: Not on file    Mercedes Hodge's family history includes COPD in her mother; Cancer in her father; Healthy in her daughter, daughter, and son; Heart disease in her mother; Juvenile Rhematoid Arthritis in her sister; Stroke in her sister; Thyroid cancer in her brother and daughter.      Objective:    Vitals:   07/20/20 1036  BP: 102/72  Pulse: 68  SpO2: 98%    Physical Exam Well-developed well-nourished older white female in no acute distress.  Height, Weight, 210 BMI 32.9  HEENT; nontraumatic normocephalic, EOMI, PER R LA, sclera anicteric. Oropharynx; not examined Neck; supple, no JVD Cardiovascular; regular rate and rhythm with S1-S2, no murmur rub or gallop Pulmonary; Clear bilaterally Abdomen; soft, no focal tenderness ,nondistended, no palpable mass or hepatosplenomegaly, bowel sounds are active, several surgical scars Rectal; not done today Skin; benign exam, no jaundice rash or appreciable lesions Extremities; no clubbing cyanosis or edema skin warm  and dry Neuro/Psych; alert and oriented x4, grossly nonfocal mood and affect appropriate       Assessment & Plan:   #54 64 year old white female with 65-monthhistory of change in bowel habits with episodes of constipation/abdominal bloating and pain followed by episodes of diarrhea which have been recurring repeatedly.  Pain has gradually been worsening, particularly bad episode about 2 weeks ago lasting for 24 hours or so. Patient has also had associated 8 pound weight loss over the past month  Etiology of symptoms is not clear rule out intermittent low-grade obstruction, underlying chronic diverticular disease, neoplasm.  #2 prior [place] status post gastric sleeve 2019 4.  Status post remote hysterectomy 5.  Status post cholecystectomy 6.  History of rheumatoid arthritis 7.  GERD 8.  History of thyroid  CA  Plan;Patient will be scheduled for CT scan of the abdomen and pelvis with contrast. Patient has signed a release and will obtain copies of her prior GI evaluations, colonoscopies and path.  She may require repeat Colonoscopy as part of this work-up pending findings of CT. Trial of Levsin sublingual every 4 to 6 hours as needed for episodes of pain/cramping/diarrhea. Patient will be established with Dr. NAncil LinseyPA-C 07/20/2020   Cc: JMartinique Betty G, MD

## 2020-07-21 ENCOUNTER — Ambulatory Visit: Payer: Medicare HMO

## 2020-07-29 ENCOUNTER — Ambulatory Visit (HOSPITAL_COMMUNITY)
Admission: RE | Admit: 2020-07-29 | Discharge: 2020-07-29 | Disposition: A | Discharge status: Home / Self Care | Payer: Medicare HMO | Source: Ambulatory Visit | Attending: Physician Assistant | Admitting: Physician Assistant

## 2020-07-29 ENCOUNTER — Other Ambulatory Visit: Payer: Self-pay

## 2020-07-29 ENCOUNTER — Encounter (HOSPITAL_COMMUNITY): Payer: Self-pay

## 2020-07-29 DIAGNOSIS — R1030 Lower abdominal pain, unspecified: Secondary | ICD-10-CM | POA: Diagnosis not present

## 2020-07-29 DIAGNOSIS — R194 Change in bowel habit: Secondary | ICD-10-CM | POA: Diagnosis not present

## 2020-07-29 DIAGNOSIS — Z86010 Personal history of colonic polyps: Secondary | ICD-10-CM | POA: Diagnosis not present

## 2020-07-29 DIAGNOSIS — R109 Unspecified abdominal pain: Secondary | ICD-10-CM | POA: Diagnosis not present

## 2020-07-29 DIAGNOSIS — K5790 Diverticulosis of intestine, part unspecified, without perforation or abscess without bleeding: Secondary | ICD-10-CM | POA: Insufficient documentation

## 2020-07-29 MED ORDER — IOHEXOL 300 MG/ML  SOLN
100.0000 mL | Freq: Once | INTRAMUSCULAR | Status: AC | PRN
  Administered 2020-07-29: 100 mL via INTRAVENOUS

## 2020-08-18 ENCOUNTER — Encounter: Payer: Self-pay | Admitting: Family Medicine

## 2020-08-18 ENCOUNTER — Other Ambulatory Visit: Payer: Self-pay

## 2020-08-18 ENCOUNTER — Ambulatory Visit (INDEPENDENT_AMBULATORY_CARE_PROVIDER_SITE_OTHER): Payer: Medicare HMO | Admitting: Family Medicine

## 2020-08-18 VITALS — BP 110/74 | HR 78 | Temp 98.4°F | Ht 67.0 in | Wt 211.6 lb

## 2020-08-18 DIAGNOSIS — E890 Postprocedural hypothyroidism: Secondary | ICD-10-CM | POA: Diagnosis not present

## 2020-08-18 DIAGNOSIS — Z23 Encounter for immunization: Secondary | ICD-10-CM

## 2020-08-18 DIAGNOSIS — Z1239 Encounter for other screening for malignant neoplasm of breast: Secondary | ICD-10-CM | POA: Diagnosis not present

## 2020-08-18 DIAGNOSIS — N200 Calculus of kidney: Secondary | ICD-10-CM | POA: Diagnosis not present

## 2020-08-18 DIAGNOSIS — I700 Atherosclerosis of aorta: Secondary | ICD-10-CM | POA: Diagnosis not present

## 2020-08-18 DIAGNOSIS — F320 Major depressive disorder, single episode, mild: Secondary | ICD-10-CM

## 2020-08-18 DIAGNOSIS — I10 Essential (primary) hypertension: Secondary | ICD-10-CM

## 2020-08-18 DIAGNOSIS — Z0000 Encounter for general adult medical examination without abnormal findings: Secondary | ICD-10-CM

## 2020-08-18 NOTE — Patient Instructions (Addendum)
A few things to remember from today's visit: Today you have you routine preventive visit.  At least 150 minutes of moderate exercise per week, daily brisk walking for 15-30 min is a good exercise option. Healthy diet low in saturated (animal) fats and sweets and consisting of fresh fruits and vegetables, lean meats such as fish and white chicken and whole grains.  These are some of recommendations for screening depending of age and risk factors:  - Vaccines:  Tdap vaccine every 10 years.  Shingles vaccine recommended at age 55, could be given after 66 years of age but not sure about insurance coverage.   Pneumonia vaccines: Pneumovax at 25. Sometimes Pneumovax is giving earlier if history of smoking, lung disease,diabetes,kidney disease among some.Compleed today.  Screening for diabetes at age 14 and every 3 years.  Cervical cancer prevention:  Pap smear starts at 66 years of age and continues periodically until 67 years old in low risk women. Pap smear every 3 years between 83 and 61 years old. Pap smear every 3-5 years between women 70 and older if pap smear negative and HPV screening negative.   -Breast cancer: Mammogram: There is disagreement between experts about when to start screening in low risk asymptomatic female but recent recommendations are to start screening at 67 and not later than 66 years old , every 1-2 years and after 66 yo q 2 years. Screening is recommended until 66 years old but some women can continue screening depending of healthy issues.Mammogram will be arranged.   Colon cancer screening: Has been recently changed to 66 yo. Insurance may not cover until you are 66 years old. Screening is recommended until 66 years old.  Cholesterol disorder screening at age 42 and every 3 years.N/A  Also recommended:  1. Dental visit- Brush and floss your teeth twice daily; visit your dentist twice a year. 2. Eye doctor- Get an eye exam at least every 2 years. 3. Helmet  use- Always wear a helmet when riding a bicycle, motorcycle, rollerblading or skateboarding. 4. Safe sex- If you may be exposed to sexually transmitted infections, use a condom. 5. Seat belts- Seat belts can save your live; always wear one. 6. Smoke/Carbon Monoxide detectors- These detectors need to be installed on the appropriate level of your home. Replace batteries at least once a year. 7. Skin cancer- When out in the sun please cover up and use sunscreen 15 SPF or higher. 8. Violence- If anyone is threatening or hurting you, please tell your healthcare provider.  9. Drink alcohol in moderation- Limit alcohol intake to one drink or less per day. Never drink and drive. 10. Calcium supplementation 1000 to 1200 mg daily, ideally through your diet.  Vitamin D supplementation 800 units daily.   Routine general medical examination at a health care facility  Essential hypertension  Aortic atherosclerosis ([place])  Nephrolithiasis - Plan: Urinalysis, Routine w reflex microscopic  Encounter for screening for malignant neoplasm of breast, unspecified screening modality - Plan: MM 3D SCREEN BREAST BILATERAL  Aspirin 81 mg daily. No changes in rest of meds. Monitor blood pressure at home.  If you need refills please call your pharmacy. Do not use My Chart to request refills or for acute issues that need immediate attention.    Please be sure medication list is accurate. If a new problem present, please set up appointment sooner than planned today.

## 2020-08-18 NOTE — Progress Notes (Signed)
HPI: [name] is a 64 y.o. female, who is here today for 6 months follow up.   She was last seen on 02/17/20. She is due for CPE, agrees with having it today. Last CPE > a year ago. She lives with her husband. She is exercising regularly, walking 3-4 times per week, stationary bike 1/week. She follows a healthful diet.  Chronic medical problems: RA,HTN,aortic atherosclerosis,hearing loss,depression,and HLD among some.  Colonoscopy: 3 years ago,07/06/16 Mammogram: 04/11/18 DEXA on 02/04/20, ordered by her rheumatologist.  HTN: Dx'ed in 2002. She is on HCTZ 12.5 mg daily and Amlodipine-Benazepril 5-40 mg daily. Negative for severe/frequent headache,  chest pain, dyspnea,or focal weakness.  Lab Results  Component Value Date   CREATININE 0.74 06/29/2020   BUN 15 06/29/2020   NA 137 06/29/2020   K 3.6 06/29/2020   CL 102 06/29/2020   CO2 28 06/29/2020   RJJ:OACZYSAYTKZ 20 mg daily.  Lab Results  Component Value Date   CHOL 188 02/17/2020   HDL 73.20 02/17/2020   LDLCALC 100 (H) 02/17/2020   TRIG 74.0 02/17/2020   CHOLHDL 3 02/17/2020   3 months of episodes of severe diarrhea alternating with constipation. She has not identified exacerbating or alleviating factors. Associated nausea.  S/P bariatric gastro bypass but has not had this problem before. No dietary changes.  Evaluated by GI and abdominal/pelvic CT scan was done on 07/29/20. 1. 3 x 5 mm calculus in the proximal LEFT ureter just below the UPJ with associated urothelial thickening and periureteral stranding. No hydronephrosis. 2. Given the presence of urothelial thickening on the LEFT follow-up assessment and or direct visualization may be helpful to exclude underlying lesion though this is likely related to the presence of the ureteral calculus. 3. Post sleeve gastrectomy with small hiatal hernia and mild circumferential distal esophageal thickening, correlate with any clinical evidence of  esophagitis with esophageal evaluation as warranted. 4. 3 mm RIGHT middle lobe pulmonary nodule. No follow-up needed if patient is low-risk. Non-contrast chest CT can be considered in 12 months if patient is high-risk.  5. Marked renal cortical scarring on the RIGHT particularly in the upper pole. 6. Colonic diverticulosis without evidence of acute colonic process. 7. Aortic atherosclerosis.  She was referred to urologist, she received call letting her know that appts for this year were not available. She has not noted gross hematuria,decreased urine output,or difficulty starting urination.  Hypothyroidism: She is on Synthroid 175 mcg and 150 mcg, daily alternating. She has not noted palpitations or tremor.  Lab Results  Component Value Date   TSH 0.62 02/17/2020   Depression: She is on Lexapro 10 mg daily. Negative for depressed mood.  Review of Systems  Constitutional: Negative for appetite change and fatigue.  HENT: Negative for dental problem, hearing loss, mouth sores and sore throat.   Eyes: Negative for redness and visual disturbance.  Respiratory: Negative for cough, shortness of breath and wheezing.   Cardiovascular: Negative for leg swelling.  Gastrointestinal: Negative for blood in stool and vomiting.  Endocrine: Negative for cold intolerance, heat intolerance, polydipsia, polyphagia and polyuria.  Genitourinary: Negative for dysuria, vaginal bleeding and vaginal discharge.  Musculoskeletal: Positive for arthralgias. Negative for gait problem and myalgias.  Skin: Negative for color change and rash.  Allergic/Immunologic: Positive for environmental allergies.  Neurological: Negative for syncope and facial asymmetry.  Hematological: Negative for adenopathy. Does not bruise/bleed easily.  Psychiatric/Behavioral: Negative for behavioral problems and confusion.  All other systems reviewed and are negative.  Current  Outpatient Medications on File Prior to Visit  Medication  Sig Dispense Refill   Cholecalciferol (VITAMIN D) 50 MCG (2000 UT) tablet Take 2,000 Units by mouth daily.     diclofenac sodium (VOLTAREN) 1 % GEL Apply topically 4 (four) times daily.     escitalopram (LEXAPRO) 10 MG tablet Take 1 tablet (10 mg total) by mouth daily. 90 tablet 3   estradiol (ESTRACE) 0.1 MG/GM vaginal cream Place 1 Applicatorful vaginally 2 (two) times a week. 42.5 g 6   fluticasone (FLONASE) 50 MCG/ACT nasal spray Place into both nostrils as needed for allergies or rhinitis.     Fluticasone-Salmeterol 113-14 MCG/ACT AEPB Inhale 1 puff into the lungs 2 (two) times daily. 1 each 2   hydrochlorothiazide (HYDRODIURIL) 12.5 MG tablet TAKE 1 TABLET EVERY DAY 90 tablet 2   hydroxychloroquine (PLAQUENIL) 200 MG tablet Take 2 tablets (400 mg total) by mouth daily. 180 tablet 0   hyoscyamine (LEVSIN SL) 0.125 MG SL tablet Dissolve 1 tablet on the tongue every 4-6 hours and needed for abdominal cramping/diarrhea 30 tablet 3   levothyroxine (SYNTHROID) 150 MCG tablet Take 1 tablet (150 mcg total) by mouth every other day. 90 tablet 3   levothyroxine (SYNTHROID) 175 MCG tablet Take 1 tablet (175 mcg total) by mouth every other day. 90 tablet 3   pravastatin (PRAVACHOL) 20 MG tablet Take 1 tablet (20 mg total) by mouth daily. 90 tablet 3   No current facility-administered medications on file prior to visit.   Past Medical History:  Diagnosis Date   Allergy    Arthritis    Asthma    Cancer ([place])    thyroid   GERD (gastroesophageal reflux disease)    Hyperlipidemia    Hypertension    Thyroid disease    Postsurgical hypothyroidism   Allergies  Allergen Reactions   Lovenox [Enoxaparin Sodium] Rash    rash    Social History   Socioeconomic History   Marital status: Married    Spouse name: Not on file   Number of children: Not on file   Years of education: Not on file   Highest education level: Not on file  Occupational History   Not on file   Tobacco Use   Smoking status: Never Smoker   Smokeless tobacco: Never Used  Vaping Use   Vaping Use: Never used  Substance and Sexual Activity   Alcohol use: Not Currently    Comment: occ wine    Drug use: Never   Sexual activity: Yes    Birth control/protection: None  Other Topics Concern   Not on file  Social History Narrative   Not on file   Social Determinants of Health   Financial Resource Strain: Low Risk    Difficulty of Paying Living Expenses: Not hard at all  Food Insecurity: Not on file  Transportation Needs: No Transportation Needs   Lack of Transportation (Medical): No   Lack of Transportation (Non-Medical): No  Physical Activity: Not on file  Stress: Not on file  Social Connections: Not on file    Vitals:   08/18/20 0859  BP: 110/74  Pulse: 78  Temp: 98.4 F (36.9 C)  SpO2: 98%   Body mass index is 33.14 kg/m.   Physical Exam Vitals and nursing note reviewed.  Constitutional:      General: She is not in acute distress.    Appearance: She is well-developed.  HENT:     Head: Normocephalic and atraumatic.     Right  Ear: Hearing, tympanic membrane, ear canal and external ear normal.     Left Ear: Hearing, tympanic membrane, ear canal and external ear normal.     Mouth/Throat:     Mouth: Mucous membranes are moist.     Pharynx: Oropharynx is clear. Uvula midline.  Eyes:     Extraocular Movements: Extraocular movements intact.     Conjunctiva/sclera: Conjunctivae normal.     Pupils: Pupils are equal, round, and reactive to light.  Neck:     Thyroid: No thyromegaly.     Trachea: No tracheal deviation.  Cardiovascular:     Rate and Rhythm: Normal rate and regular rhythm.     Pulses:          Dorsalis pedis pulses are 2+ on the right side and 2+ on the left side.     Heart sounds: No murmur heard.   Pulmonary:     Effort: Pulmonary effort is normal. No respiratory distress.     Breath sounds: Normal breath sounds.  Chest:   Breasts:     Right: No supraclavicular adenopathy.     Left: No supraclavicular adenopathy.    Abdominal:     Palpations: Abdomen is soft. There is no hepatomegaly or mass.     Tenderness: There is no abdominal tenderness.  Musculoskeletal:     Comments: No signs of synovitis appreciated.  Lymphadenopathy:     Cervical: No cervical adenopathy.     Upper Body:     Right upper body: No supraclavicular adenopathy.     Left upper body: No supraclavicular adenopathy.  Skin:    General: Skin is warm.     Findings: No erythema or rash.  Neurological:     General: No focal deficit present.     Mental Status: She is alert and oriented to person, place, and time.     Cranial Nerves: No cranial nerve deficit.     Coordination: Coordination normal.     Gait: Gait normal.     Deep Tendon Reflexes:     Reflex Scores:      Bicep reflexes are 2+ on the right side and 2+ on the left side.      Patellar reflexes are 2+ on the right side and 2+ on the left side. Psychiatric:        Speech: Speech normal.     Comments: Well groomed, good eye contact.   ASSESSMENT AND PLAN:   Ms. Mercedes Hodge was seen today for CPE and 6 months follow-up.  Orders Placed This Encounter  Procedures   MICROSCOPIC MESSAGE   MM 3D SCREEN BREAST BILATERAL   US THYROID   Pneumococcal polysaccharide vaccine 23-valent greater than or equal to 2yo subcutaneous/IM   Urinalysis, Routine w reflex microscopic   TSH    Routine general medical examination at a health care facility She understands the importance of regular physical activity and healthy diet for prevention of chronic illness and/or complications. Preventive guidelines reviewed. Vaccination updated.  Ca++ and vit D supplementation recommended. Next CPE in a year.  Essential hypertension BP adequately controlled. Continue Amlodipine-benazepril 5-40 mg and HCTZ 12.5 mg daily. Monitor BP at home regularly.  -     amLODipine-benazepril  (LOTREL) 5-40 MG capsule; Take 1 capsule by mouth daily.  Aortic atherosclerosis (HCC) Continue Pravastatin 20 mg daily. Some side effects of Aspirin discussed, recommend 81 mg daily.  Nephrolithiasis This problem needs to be considered as etiologic factor for episodic abdominal pain+nausea+changes in bowel habits. Adequate hydration.  Instructed about warning signs.  Encounter for screening for malignant neoplasm of breast, unspecified screening modality -     MM 3D SCREEN BREAST BILATERAL; Future  Postoperative hypothyroidism Problem has been well controlled. No changes in current management. Thyroid US will be arranged. TSH to be done with next blood work at her rheumatology visit, 12/01/20.  Need for pneumococcal vaccination -     Pneumococcal polysaccharide vaccine 23-valent greater than or equal to 2yo subcutaneous/IM  Depression, major, single episode, mild (HCC) Stable. Continue Lexapro 10 mg daily.   Return in 1 year (on 08/18/2021) for cpe. AWV needed..  Hamdi Vari G. Martinique, MD  [facility]. [place] office.  A few things to remember from today's visit: Today you have you routine preventive visit.  At least 150 minutes of moderate exercise per week, daily brisk walking for 15-30 min is a good exercise option. Healthy diet low in saturated (animal) fats and sweets and consisting of fresh fruits and vegetables, lean meats such as fish and white chicken and whole grains.  These are some of recommendations for screening depending of age and risk factors:  - Vaccines:  Tdap vaccine every 10 years.  Shingles vaccine recommended at age 69, could be given after 66 years of age but not sure about insurance coverage.   Pneumonia vaccines: Pneumovax at 36. Sometimes Pneumovax is giving earlier if history of smoking, lung disease,diabetes,kidney disease among some.Compleed today.  Screening for diabetes at age 37 and every 3 years.  Cervical cancer prevention:   Pap smear starts at 66 years of age and continues periodically until 66 years old in low risk women. Pap smear every 3 years between 65 and 34 years old. Pap smear every 3-5 years between women 66 and older if pap smear negative and HPV screening negative.   -Breast cancer: Mammogram: There is disagreement between experts about when to start screening in low risk asymptomatic female but recent recommendations are to start screening at 64 and not later than 66 years old , every 1-2 years and after 66 yo q 2 years. Screening is recommended until 66 years old but some women can continue screening depending of healthy issues.Mammogram will be arranged.   Colon cancer screening: Has been recently changed to 66 yo. Insurance may not cover until you are 66 years old. Screening is recommended until 66 years old.  Cholesterol disorder screening at age 40 and every 3 years.N/A  Also recommended:  1. Dental visit- Brush and floss your teeth twice daily; visit your dentist twice a year. 2. Eye doctor- Get an eye exam at least every 2 years. 3. Helmet use- Always wear a helmet when riding a bicycle, motorcycle, rollerblading or skateboarding. 4. Safe sex- If you may be exposed to sexually transmitted infections, use a condom. 5. Seat belts- Seat belts can save your live; always wear one. 6. Smoke/Carbon Monoxide detectors- These detectors need to be installed on the appropriate level of your home. Replace batteries at least once a year. 7. Skin cancer- When out in the sun please cover up and use sunscreen 15 SPF or higher. 8. Violence- If anyone is threatening or hurting you, please tell your healthcare provider.  9. Drink alcohol in moderation- Limit alcohol intake to one drink or less per day. Never drink and drive. 10. Calcium supplementation 1000 to 1200 mg daily, ideally through your diet.  Vitamin D supplementation 800 units daily.   Routine general medical examination at a health care  facility  Essential  hypertension  Aortic atherosclerosis (HCC)  Nephrolithiasis - Plan: Urinalysis, Routine w reflex microscopic  Encounter for screening for malignant neoplasm of breast, unspecified screening modality - Plan: MM 3D SCREEN BREAST BILATERAL  Aspirin 81 mg daily. No changes in rest of meds. Monitor blood pressure at home.  If you need refills please call your pharmacy. Do not use My Chart to request refills or for acute issues that need immediate attention.    Please be sure medication list is accurate. If a new problem present, please set up appointment sooner than planned today.

## 2020-08-18 NOTE — Progress Notes (Signed)
Reviewed and agree with documentation and assessment and plan. K. Veena Linell Shawn , MD   

## 2020-08-19 LAB — URINALYSIS, ROUTINE W REFLEX MICROSCOPIC
Bilirubin Urine: NEGATIVE
Glucose, UA: NEGATIVE
Hyaline Cast: NONE SEEN /LPF
Ketones, ur: NEGATIVE
Nitrite: POSITIVE — AB
Specific Gravity, Urine: 1.017 (ref 1.001–1.03)
pH: 7 (ref 5.0–8.0)

## 2020-08-20 ENCOUNTER — Other Ambulatory Visit: Payer: Self-pay | Admitting: Family Medicine

## 2020-08-20 ENCOUNTER — Other Ambulatory Visit: Payer: Self-pay | Admitting: Rheumatology

## 2020-08-20 DIAGNOSIS — F325 Major depressive disorder, single episode, in full remission: Secondary | ICD-10-CM

## 2020-08-20 NOTE — Telephone Encounter (Signed)
Last VV 09/11/19 Last fill 09/11/19  #90/3 Patient has a new provider.

## 2020-08-23 ENCOUNTER — Encounter: Payer: Self-pay | Admitting: Family Medicine

## 2020-08-23 MED ORDER — AMLODIPINE BESY-BENAZEPRIL HCL 5-40 MG PO CAPS: 1.0000 | ORAL_CAPSULE | Freq: Every day | ORAL | 3 refills | Status: DC

## 2020-08-24 ENCOUNTER — Other Ambulatory Visit: Payer: Self-pay

## 2020-08-24 MED ORDER — SULFAMETHOXAZOLE-TRIMETHOPRIM 800-160 MG PO TABS: 1.0000 | ORAL_TABLET | Freq: Two times a day (BID) | ORAL | 0 refills | Status: DC

## 2020-08-27 ENCOUNTER — Ambulatory Visit (INDEPENDENT_AMBULATORY_CARE_PROVIDER_SITE_OTHER): Payer: Medicare HMO | Admitting: Physician Assistant

## 2020-08-27 ENCOUNTER — Encounter: Payer: Self-pay | Admitting: Physician Assistant

## 2020-08-27 VITALS — BP 108/70 | HR 83 | Ht 67.0 in | Wt 208.0 lb

## 2020-08-27 DIAGNOSIS — K580 Irritable bowel syndrome with diarrhea: Secondary | ICD-10-CM | POA: Diagnosis not present

## 2020-08-27 MED ORDER — RIFAXIMIN 550 MG PO TABS: 550.0000 mg | ORAL_TABLET | Freq: Two times a day (BID) | ORAL | 0 refills | Status: DC

## 2020-08-27 MED ORDER — RIFAXIMIN 550 MG PO TABS: 550.0000 mg | ORAL_TABLET | Freq: Three times a day (TID) | ORAL | 0 refills | Status: AC

## 2020-08-27 NOTE — Patient Instructions (Addendum)
If you are age 66 or older, your body mass index should be between 23-30. Your Body mass index is 32.58 kg/m. If this is out of the aforementioned range listed, please consider follow up with your Primary Care Provider.  If you are age 37 or younger, your body mass index should be between 19-25. Your Body mass index is 32.58 kg/m. If this is out of the aformentioned range listed, please consider follow up with your Primary Care Provider.   Continue Levsin 1 tablet every 6 hours as needed for abdominal pain/cramping, diarrhea  START Xifaxin 550 mg 1 tablet three times a day for 14 days Call the office and give an update after finishing the [facility] will contact you to schedule an appointment this afternoon.  Follow up in 4 months with Dr. Silverio Decamp or Nicoletta Ba, PA-C.  Thank you for entrusting me with your care and choosing [facility].  Amy Esterwood, PA-C

## 2020-08-27 NOTE — Progress Notes (Signed)
Subjective:    Patient ID: Mercedes Hodge, female    DOB: 1956/04/09, 64 y.o.   MRN: [id number]  HPI  Mercedes Hodge is a pleasant 64 year old female, recently established when seen about a month ago.  She comes in today for follow-up.  At initial visit she had 46-month history of change in bowel habits, frequent abdominal cramping and bloating, some episodic diarrhea and had had a weight loss of about 8 pounds. She had undergone gastric sleeve in 2019, and it had prior GI work-up while living in [place] with last colonoscopy in 2017. She has history of polyps. She is status post cholecystectomy, has history of chronic GERD, hypertension and rheumatoid arthritis. She underwent CT of the abdomen pelvis on 07/29/2020 which showed a small left hepatic cyst, status post cholecystectomy state and a left ureteral stone with some thickening at the site of the stone in the proximal left ureter, no hydronephrosis noted, also noted mild distal esophageal thickening and colonic diverticulosis. Radiology had recommended further urologic evaluation. [facility]. She says that she had been called, apparently they had very few available appointments and she was never made an appointment. Fortunately she says she is not been having any pain though she was treated about a week ago with a 3-day course of Septra for a UTI. She believes that the stone has been present at least back to 2019. As far as her GI symptoms she continues to have episodes of diarrhea about 3 to 4 days/week and on these days may had several bowel movements. She will then have a day or 2 with no bowel movements and then go back to loose stools. She is not having as much abdominal bloating or discomfort. Continues to complain of  Gas. Appetite has been okay and she feels that she has been eating well. Her weight is down 2 pounds since the last office visit. She had been given a prescription for Levsin sublingual but says  she has not used that as she was reserving it for episodes of severe pain and she had not had any over the past weeks.  Review of Systems Pertinent positive and negative review of systems were noted in the above HPI section.  All other review of systems was otherwise negative.  Outpatient Encounter Medications as of 08/27/2020  Medication Sig  . amLODipine-benazepril (LOTREL) 5-40 MG capsule Take 1 capsule by mouth daily.  . Cholecalciferol (VITAMIN D) 50 MCG (2000 UT) tablet Take 2,000 Units by mouth daily.  . diclofenac sodium (VOLTAREN) 1 % GEL Apply topically 4 (four) times daily.  Marland Kitchen escitalopram (LEXAPRO) 10 MG tablet Take 1 tablet (10 mg total) by mouth daily.  Marland Kitchen estradiol (ESTRACE) 0.1 MG/GM vaginal cream Place 1 Applicatorful vaginally 2 (two) times a week.  . fluticasone (FLONASE) 50 MCG/ACT nasal spray Place into both nostrils as needed for allergies or rhinitis.  . Fluticasone-Salmeterol 113-14 MCG/ACT AEPB Inhale 1 puff into the lungs 2 (two) times daily.  . hydrochlorothiazide (HYDRODIURIL) 12.5 MG tablet TAKE 1 TABLET EVERY DAY  . hydroxychloroquine (PLAQUENIL) 200 MG tablet Take 2 tablets (400 mg total) by mouth daily.  . hyoscyamine (LEVSIN SL) 0.125 MG SL tablet Dissolve 1 tablet on the tongue every 4-6 hours and needed for abdominal cramping/diarrhea  . levothyroxine (SYNTHROID) 150 MCG tablet Take 1 tablet (150 mcg total) by mouth every other day.  . levothyroxine (SYNTHROID) 175 MCG tablet Take 1 tablet (175 mcg total) by mouth every other day.  Marland Kitchen  pravastatin (PRAVACHOL) 20 MG tablet Take 1 tablet (20 mg total) by mouth daily.  . rifaximin (XIFAXAN) 550 MG TABS tablet Take 1 tablet (550 mg total) by mouth 3 (three) times daily for 14 days.  . [DISCONTINUED] rifaximin (XIFAXAN) 550 MG TABS tablet Take 1 tablet (550 mg total) by mouth 2 (two) times daily for 14 days.  . [DISCONTINUED] sulfamethoxazole-trimethoprim (BACTRIM DS) 800-160 MG tablet Take 1 tablet by mouth 2 (two)  times daily for 3 days. (Patient not taking: Reported on 08/27/2020)   No facility-administered encounter medications on file as of 08/27/2020.   Allergies  Allergen Reactions  . Lovenox [Enoxaparin Sodium] Rash    rash   Patient Active Problem List   Diagnosis Date Noted  . Aortic atherosclerosis ([place]) 08/18/2020  . History of colonic polyps 07/20/2020  . Depression, major, single episode, mild ([place]) 02/17/2020  . Atrophic vaginitis 02/17/2020  . Rheumatoid arthritis of multiple sites with negative rheumatoid factor ([place]) 07/02/2019  . High risk medication use 07/02/2019  . Primary osteoarthritis of both hands 07/02/2019  . Status post total bilateral knee replacement 07/02/2019  . Status post right ankle joint replacement 07/02/2019  . Essential hypertension 07/02/2019  . History of asthma 07/02/2019  . History of thyroid cancer 07/02/2019  . Postoperative hypothyroidism 07/02/2019  . Dyslipidemia 07/02/2019  . Gastroesophageal reflux disease without esophagitis 07/02/2019  . Osteopenia of multiple sites 07/02/2019   Social History   Socioeconomic History  . Marital status: Married    Spouse name: Not on file  . Number of children: Not on file  . Years of education: Not on file  . Highest education level: Not on file  Occupational History  . Not on file  Tobacco Use  . Smoking status: Never Smoker  . Smokeless tobacco: Never Used  Vaping Use  . Vaping Use: Never used  Substance and Sexual Activity  . Alcohol use: Not Currently    Comment: occ wine   . Drug use: Never  . Sexual activity: Yes    Birth control/protection: None  Other Topics Concern  . Not on file  Social History Narrative  . Not on file   Social Determinants of Health   Financial Resource Strain: Low Risk   . Difficulty of Paying Living Expenses: Not hard at all  Food Insecurity: Not on file  Transportation Needs: No Transportation Needs  . Lack of Transportation (Medical): No  . Lack of  Transportation (Non-Medical): No  Physical Activity: Not on file  Stress: Not on file  Social Connections: Not on file  Intimate Partner Violence: Not on file    Ms. Charbonneau's family history includes COPD in her mother; Cancer in her father; Healthy in her daughter, daughter, and son; Heart disease in her mother; Juvenile Rhematoid Arthritis in her sister; Stroke in her sister; Thyroid cancer in her brother and daughter.      Objective:    Vitals:   08/27/20 0859  BP: 108/70  Pulse: 83  SpO2: 96%    Physical Exam Well-developed well-nourished WF in no acute distress.  Height, Weight, 208 BMI 32.5  HEENT; nontraumatic normocephalic, EOMI, PE RR LA, sclera anicteric. Oropharynx;not examined Neck; supple, no JVD Cardiovascular; regular rate and rhythm with S1-S2, no murmur rub or gallop Pulmonary; Clear bilaterally Abdomen; soft, nontender, nondistended, no palpable mass or hepatosplenomegaly, bowel sounds are active Rectal;not done Skin; benign exam, no jaundice rash or appreciable lesions Extremities; no clubbing cyanosis or edema skin warm and dry Neuro/Psych; alert  and oriented x4, grossly nonfocal mood and affect appropriate       Assessment & Plan:   Number one 64 year old white female, status post gastric sleeve 2019, with 93-month history of change in bowel habits now with recurrent episodes of diarrhea and associated abdominal pain and bloating. CT of the abdomen pelvis has just been done and was negative for any intestinal issues, no evidence of partial obstruction etc. I think her symptoms are secondary to IBS, possible SIBO.  #2 history of colon polyps-last colonoscopy 2017 done in [place]. We had requested records at the time of her initial office visit but have not obtain those, prior gastroenterologist office contacted again today for records which are pending. 3. Status post cholecystectomy 4. Status post hysterectomy 5. Left ureteral stone in the  proximal left ureter with surrounding thickening. Urology consultation pending  Plan  [facility] contacted [facility] today for appointment for further evaluation of the left ureteral stone. Will obtain prior records from [place] to determine timing of next colonoscopy Patient advised to use the Levsin sublingual every 6 hours as needed for episodes of abdominal bloating and diarrhea. We also discussed possible SIBO, breath testing for confirmation versus empiric course of Xifaxan. Patient would like to proceed with an empiric course of Xifaxan 550 mg p.o. 3 times daily x14 days. Have asked her to call back in 3 to 4 weeks with an update after treatment with Xifaxan. Hopefully this will be beneficial and decrease her episodes of diarrhea and abdominal cramping.  Edilberto Roosevelt Genia Harold PA-C 08/27/2020   Cc: Martinique, Betty G, MD

## 2020-08-28 DIAGNOSIS — N202 Calculus of kidney with calculus of ureter: Secondary | ICD-10-CM | POA: Diagnosis not present

## 2020-09-02 ENCOUNTER — Encounter: Payer: Self-pay | Admitting: Family Medicine

## 2020-09-02 ENCOUNTER — Ambulatory Visit
Admission: RE | Admit: 2020-09-02 | Discharge: 2020-09-02 | Disposition: A | Discharge status: Home / Self Care | Payer: Medicare HMO | Source: Ambulatory Visit | Attending: Family Medicine | Admitting: Family Medicine

## 2020-09-02 DIAGNOSIS — Z85850 Personal history of malignant neoplasm of thyroid: Secondary | ICD-10-CM | POA: Diagnosis not present

## 2020-09-02 DIAGNOSIS — F325 Major depressive disorder, single episode, in full remission: Secondary | ICD-10-CM

## 2020-09-02 DIAGNOSIS — E890 Postprocedural hypothyroidism: Secondary | ICD-10-CM

## 2020-09-03 ENCOUNTER — Telehealth: Payer: Self-pay | Admitting: Physician Assistant

## 2020-09-03 NOTE — Telephone Encounter (Signed)
I've submitted a prior authorization for the patient's Xifaxin. What can I send in for the patient's hemrrhoids?

## 2020-09-04 ENCOUNTER — Other Ambulatory Visit: Payer: Self-pay | Admitting: Family Medicine

## 2020-09-04 DIAGNOSIS — E785 Hyperlipidemia, unspecified: Secondary | ICD-10-CM

## 2020-09-07 MED ORDER — ESCITALOPRAM OXALATE 10 MG PO TABS: 10.0000 mg | ORAL_TABLET | Freq: Every day | ORAL | 3 refills | Status: DC

## 2020-09-07 NOTE — Telephone Encounter (Signed)
Please help with rx request, pt is no longer with Dr. Loletha Grayer.

## 2020-09-09 DIAGNOSIS — N201 Calculus of ureter: Secondary | ICD-10-CM | POA: Diagnosis not present

## 2020-09-10 MED ORDER — HYDROCORTISONE ACETATE 25 MG RE SUPP: 25.0000 mg | Freq: Every evening | RECTAL | 3 refills | Status: AC

## 2020-09-10 NOTE — Telephone Encounter (Signed)
Anusol  HC supp qhs x 7 days then repeat as needed-  one box/ 3 refills  Can also get Recticare complete OTC to use externally if any external hemorrhoidal sxs

## 2020-09-10 NOTE — Telephone Encounter (Signed)
Patient has been notified that anusol has been sent to her pharmacy.

## 2020-09-15 ENCOUNTER — Other Ambulatory Visit (HOSPITAL_COMMUNITY)
Admission: RE | Admit: 2020-09-15 | Discharge: 2020-09-15 | Disposition: A | Discharge status: Home / Self Care | Payer: Medicare HMO | Source: Ambulatory Visit | Attending: Urology | Admitting: Urology

## 2020-09-15 ENCOUNTER — Other Ambulatory Visit: Payer: Self-pay | Admitting: Urology

## 2020-09-15 DIAGNOSIS — Z01818 Encounter for other preprocedural examination: Secondary | ICD-10-CM | POA: Insufficient documentation

## 2020-09-15 DIAGNOSIS — Z20822 Contact with and (suspected) exposure to covid-19: Secondary | ICD-10-CM | POA: Insufficient documentation

## 2020-09-15 DIAGNOSIS — N201 Calculus of ureter: Secondary | ICD-10-CM

## 2020-09-15 NOTE — Telephone Encounter (Signed)
Pt states her [employer] is costing her $700 for a copay, pt is requesting for a cheaper alternative.

## 2020-09-15 NOTE — Progress Notes (Signed)
Talked with patient instructions given arrival time 43 cl. Liquids until 0445. Driver secured

## 2020-09-16 LAB — SARS CORONAVIRUS 2 (TAT 6-24 HRS): SARS Coronavirus 2: NEGATIVE

## 2020-09-17 ENCOUNTER — Ambulatory Visit (HOSPITAL_BASED_OUTPATIENT_CLINIC_OR_DEPARTMENT_OTHER)
Admission: RE | Admit: 2020-09-17 | Discharge: 2020-09-17 | Disposition: A | Discharge status: Home / Self Care | Payer: Medicare HMO | Attending: Urology | Admitting: Urology

## 2020-09-17 ENCOUNTER — Encounter (HOSPITAL_BASED_OUTPATIENT_CLINIC_OR_DEPARTMENT_OTHER)
Admission: RE | Disposition: A | Discharge status: Home / Self Care | Payer: Self-pay | Source: Home / Self Care | Attending: Urology

## 2020-09-17 ENCOUNTER — Other Ambulatory Visit: Payer: Self-pay

## 2020-09-17 ENCOUNTER — Ambulatory Visit (HOSPITAL_COMMUNITY): Payer: Medicare HMO

## 2020-09-17 ENCOUNTER — Encounter (HOSPITAL_BASED_OUTPATIENT_CLINIC_OR_DEPARTMENT_OTHER): Payer: Self-pay | Admitting: Urology

## 2020-09-17 DIAGNOSIS — Z7951 Long term (current) use of inhaled steroids: Secondary | ICD-10-CM | POA: Insufficient documentation

## 2020-09-17 DIAGNOSIS — Z823 Family history of stroke: Secondary | ICD-10-CM | POA: Diagnosis not present

## 2020-09-17 DIAGNOSIS — Z8249 Family history of ischemic heart disease and other diseases of the circulatory system: Secondary | ICD-10-CM | POA: Diagnosis not present

## 2020-09-17 DIAGNOSIS — N201 Calculus of ureter: Secondary | ICD-10-CM | POA: Diagnosis not present

## 2020-09-17 DIAGNOSIS — Z87440 Personal history of urinary (tract) infections: Secondary | ICD-10-CM | POA: Diagnosis not present

## 2020-09-17 DIAGNOSIS — Z841 Family history of disorders of kidney and ureter: Secondary | ICD-10-CM | POA: Insufficient documentation

## 2020-09-17 DIAGNOSIS — M160 Bilateral primary osteoarthritis of hip: Secondary | ICD-10-CM | POA: Diagnosis not present

## 2020-09-17 DIAGNOSIS — M069 Rheumatoid arthritis, unspecified: Secondary | ICD-10-CM | POA: Diagnosis not present

## 2020-09-17 DIAGNOSIS — Z825 Family history of asthma and other chronic lower respiratory diseases: Secondary | ICD-10-CM | POA: Insufficient documentation

## 2020-09-17 DIAGNOSIS — Z8261 Family history of arthritis: Secondary | ICD-10-CM | POA: Diagnosis not present

## 2020-09-17 DIAGNOSIS — Z85850 Personal history of malignant neoplasm of thyroid: Secondary | ICD-10-CM | POA: Insufficient documentation

## 2020-09-17 DIAGNOSIS — Z79899 Other long term (current) drug therapy: Secondary | ICD-10-CM | POA: Insufficient documentation

## 2020-09-17 DIAGNOSIS — Z791 Long term (current) use of non-steroidal anti-inflammatories (NSAID): Secondary | ICD-10-CM | POA: Diagnosis not present

## 2020-09-17 DIAGNOSIS — Z808 Family history of malignant neoplasm of other organs or systems: Secondary | ICD-10-CM | POA: Insufficient documentation

## 2020-09-17 DIAGNOSIS — M47816 Spondylosis without myelopathy or radiculopathy, lumbar region: Secondary | ICD-10-CM | POA: Diagnosis not present

## 2020-09-17 DIAGNOSIS — Z01818 Encounter for other preprocedural examination: Secondary | ICD-10-CM | POA: Diagnosis not present

## 2020-09-17 HISTORY — PX: EXTRACORPOREAL SHOCK WAVE LITHOTRIPSY: SHX1557

## 2020-09-17 SURGERY — LITHOTRIPSY, ESWL
Anesthesia: LOCAL | Laterality: Left

## 2020-09-17 MED ORDER — OXYCODONE-ACETAMINOPHEN 5-325 MG PO TABS: 1.0000 | ORAL_TABLET | Freq: Four times a day (QID) | ORAL | 0 refills | Status: DC | PRN

## 2020-09-17 MED ORDER — DIPHENHYDRAMINE HCL 25 MG PO CAPS
ORAL_CAPSULE | ORAL | Status: AC
  Filled 2020-09-17: qty 1

## 2020-09-17 MED ORDER — DIAZEPAM 5 MG PO TABS
ORAL_TABLET | ORAL | Status: AC
  Filled 2020-09-17: qty 2

## 2020-09-17 MED ORDER — CIPROFLOXACIN HCL 500 MG PO TABS
ORAL_TABLET | ORAL | Status: AC
  Filled 2020-09-17: qty 1

## 2020-09-17 MED ORDER — SODIUM CHLORIDE 0.9 % IV SOLN: INTRAVENOUS | Status: DC

## 2020-09-17 MED ORDER — DIAZEPAM 5 MG PO TABS
10.0000 mg | ORAL_TABLET | ORAL | Status: AC
  Administered 2020-09-17: 10 mg via ORAL

## 2020-09-17 MED ORDER — CIPROFLOXACIN HCL 500 MG PO TABS
500.0000 mg | ORAL_TABLET | ORAL | Status: AC
  Administered 2020-09-17: 500 mg via ORAL

## 2020-09-17 MED ORDER — DIPHENHYDRAMINE HCL 25 MG PO CAPS
25.0000 mg | ORAL_CAPSULE | ORAL | Status: AC
  Administered 2020-09-17: 25 mg via ORAL

## 2020-09-17 NOTE — Interval H&P Note (Signed)
History and Physical Interval Note:  09/17/2020 9:55 AM  Mercedes Hodge  has presented today for surgery, with the diagnosis of LEFT URETERAL CALCULI.  The various methods of treatment have been discussed with the patient and family. After consideration of risks, benefits and other options for treatment, the patient has consented to  Procedure(s): EXTRACORPOREAL SHOCK WAVE LITHOTRIPSY (ESWL) (Left) as a surgical intervention.  The patient's history has been reviewed, patient examined, no change in status, stable for surgery.  I have reviewed the patient's chart and labs.  Questions were answered to the patient's satisfaction.     Bertram Millard Dahlstedt

## 2020-09-17 NOTE — H&P (Signed)
H&P  Chief Complaint: Left ureteral stone  History of Present Illness: Mercedes Hodge is a 65 y.o. year old female presenting for ESL as mgmt of a left upper ureteral stone which has been symptomatic.  Past Medical History:  Diagnosis Date  . Allergy   . Arthritis   . Asthma   . Cancer (HCC)    thyroid  . GERD (gastroesophageal reflux disease)   . Hyperlipidemia   . Hypertension   . Thyroid disease    Postsurgical hypothyroidism    Past Surgical History:  Procedure Laterality Date  . ABDOMINAL HYSTERECTOMY  1993  . CHOLECYSTECTOMY  1982  . LAPAROSCOPIC GASTRIC SLEEVE RESECTION  07/2018  . left knee replacement  2008  . right ankle replacement  2012  . right knee replacement  2009  . trimallar FX  1995   right ankle    Home Medications:  Medications Prior to Admission  Medication Sig Dispense Refill  . amLODipine-benazepril (LOTREL) 5-40 MG capsule Take 1 capsule by mouth daily. 90 capsule 3  . Cholecalciferol (VITAMIN D) 50 MCG (2000 UT) tablet Take 2,000 Units by mouth daily.    Marland Kitchen escitalopram (LEXAPRO) 10 MG tablet Take 1 tablet (10 mg total) by mouth daily. 90 tablet 3  . Fluticasone-Salmeterol 113-14 MCG/ACT AEPB Inhale 1 puff into the lungs 2 (two) times daily. 1 each 2  . hydrochlorothiazide (HYDRODIURIL) 12.5 MG tablet TAKE 1 TABLET EVERY DAY 90 tablet 2  . hydrocortisone (ANUSOL-HC) 25 MG suppository Place 1 suppository (25 mg total) rectally at bedtime for 7 days. 12 suppository 3  . hydroxychloroquine (PLAQUENIL) 200 MG tablet Take 2 tablets (400 mg total) by mouth daily. 180 tablet 0  . levothyroxine (SYNTHROID) 150 MCG tablet Take 1 tablet (150 mcg total) by mouth every other day. 90 tablet 3  . levothyroxine (SYNTHROID) 175 MCG tablet Take 1 tablet (175 mcg total) by mouth every other day. 90 tablet 3  . pravastatin (PRAVACHOL) 20 MG tablet TAKE 1 TABLET (20 MG TOTAL) BY MOUTH DAILY. 90 tablet 2  . diclofenac sodium (VOLTAREN) 1 % GEL Apply topically 4  (four) times daily.    Marland Kitchen estradiol (ESTRACE) 0.1 MG/GM vaginal cream Place 1 Applicatorful vaginally 2 (two) times a week. 42.5 g 6  . fluticasone (FLONASE) 50 MCG/ACT nasal spray Place into both nostrils as needed for allergies or rhinitis.    . hyoscyamine (LEVSIN SL) 0.125 MG SL tablet Dissolve 1 tablet on the tongue every 4-6 hours and needed for abdominal cramping/diarrhea 30 tablet 3    Allergies:  Allergies  Allergen Reactions  . Lovenox [Enoxaparin Sodium] Rash    rash    Family History  Problem Relation Age of Onset  . COPD Mother   . Heart disease Mother   . Cancer Father        thyroid cancer  . Juvenile Rhematoid Arthritis Sister   . Stroke Sister   . Thyroid cancer Brother   . Thyroid cancer Daughter   . Healthy Daughter   . Healthy Son   . Healthy Daughter   . Colon cancer Neg Hx   . Esophageal cancer Neg Hx   . Pancreatic cancer Neg Hx   . Stomach cancer Neg Hx   . Liver disease Neg Hx     Social History:  reports that she has never smoked. She has never used smokeless tobacco. She reports previous alcohol use. She reports that she does not use drugs.  ROS: A complete review of  systems was performed.  All systems are negative except for pertinent findings as noted.  Physical Exam:  Vital signs in last 24 hours: Temp:  [97.8 F (36.6 C)] 97.8 F (36.6 C) (01/06 0737) Pulse Rate:  [64] 64 (01/06 0737) Resp:  [18] 18 (01/06 0737) BP: (132)/(70) 132/70 (01/06 0737) SpO2:  [97 %] 97 % (01/06 0737) Weight:  [94.8 kg] 94.8 kg (01/06 0737) General:  Alert and oriented, No acute distress HEENT: Normocephalic, atraumatic Neck: No JVD or lymphadenopathy Cardiovascular: Regular rate  Lungs: Normal inspiratory/expiratory excursion Abdomen: Soft, nontender, nondistended, no abdominal masses Back: No CVA tenderness Extremities: No edema Neurologic: Grossly intact  Laboratory Data:  No results found for this or any previous visit (from the past 24  hour(s)). Recent Results (from the past 240 hour(s))  SARS CORONAVIRUS 2 (TAT 6-24 HRS) Nasopharyngeal Nasopharyngeal Swab     Status: None   Collection Time: 09/15/20  2:53 PM   Specimen: Nasopharyngeal Swab  Result Value Ref Range Status   SARS Coronavirus 2 NEGATIVE NEGATIVE Final    Comment: (NOTE) SARS-CoV-2 target nucleic acids are NOT DETECTED.  The SARS-CoV-2 RNA is generally detectable in upper and lower respiratory specimens during the acute phase of infection. Negative results do not preclude SARS-CoV-2 infection, do not rule out co-infections with other pathogens, and should not be used as the sole basis for treatment or other patient management decisions. Negative results must be combined with clinical observations, patient history, and epidemiological information. The expected result is Negative.  Fact Sheet for Patients: SugarRoll.be  Fact Sheet for Healthcare Providers: https://www.woods-mathews.com/  This test is not yet approved or cleared by the [place] FDA and  has been authorized for detection and/or diagnosis of SARS-CoV-2 by FDA under an Emergency Use Authorization (EUA). This EUA will remain  in effect (meaning this test can be used) for the duration of the COVID-19 declaration under Se ction 564(b)(1) of the Act, 21 U.S.C. section 360bbb-3(b)(1), unless the authorization is terminated or revoked sooner.  Performed at [facility], [place] [address] [place], [place] [address]    Creatinine: No results for input(s): CREATININE in the last 168 hours.  Radiologic Imaging: DG Abd 1 View  Result Date: 09/17/2020 CLINICAL DATA:  Preop for lithotripsy left-sided renal stone. EXAM: ABDOMEN - 1 VIEW COMPARISON:  CT 07/29/2020.  Abdomen 09/09/2020. FINDINGS: Overlying bowel gas and stool noted throughout the colon make evaluation for stone disease difficult. Small proximal left ureteral stone is difficult to  visualize but may remain present. Pelvic calcifications consistent with phleboliths. Degenerative changes lumbar spine and both hips. Surgical clips and sutures upper abdomen. IMPRESSION: Overlying bowel gas and stool noted throughout the colon make evaluation for stone disease difficult. Small proximal left ureteral stone is difficult to visualize but may remain present. Electronically Signed   By: Marcello Moores  Register   On: 09/17/2020 07:32    Impression/Assessment:  LT upper ureteral stone  Plan:  Lt ESL. This is the primary treatment in a possible staged procedure.  Lillette Boxer Lesslie Mossa 09/17/2020, 7:59 AM  Lillette Boxer. Eldred Sooy MD

## 2020-09-17 NOTE — Discharge Instructions (Signed)
Lithotripsy, Care After This sheet gives you information about how to care for yourself after your procedure. Your health care provider may also give you more specific instructions. If you have problems or questions, contact your health care provider. What can I expect after the procedure? After the procedure, it is common to have:  Some blood in your urine. This should only last for a few days.  Soreness in your back, sides, or upper abdomen for a few days.  Blotches or bruises on your back where the pressure wave entered the skin.  Pain, discomfort, or nausea when pieces (fragments) of the kidney stone move through the tube that carries urine from the kidney to the bladder (ureter). Stone fragments may pass soon after the procedure, but they may continue to pass for up to 4-8 weeks. ? If you have severe pain or nausea, contact your health care provider. This may be caused by a large stone that was not broken up, and this may mean that you need more treatment.  Some pain or discomfort during urination.  Some pain or discomfort in the lower abdomen or (in men) at the base of the penis. Follow these instructions at home: Medicines  Take over-the-counter and prescription medicines only as told by your health care provider.  If you were prescribed an antibiotic medicine, take it as told by your health care provider. Do not stop taking the antibiotic even if you start to feel better.  Do not drive for 24 hours if you were given a medicine to help you relax (sedative).  Do not drive or use heavy machinery while taking prescription pain medicine. Eating and drinking      Drink enough water and fluids to keep your urine clear or pale yellow. This helps any remaining pieces of the stone to pass. It can also help prevent new stones from forming.  Eat plenty of fresh fruits and vegetables.  Follow instructions from your health care provider about eating and drinking restrictions. You may be  instructed: ? To reduce how much salt (sodium) you eat or drink. Check ingredients and nutrition facts on packaged foods and beverages. ? To reduce how much meat you eat.  Eat the recommended amount of calcium for your age and gender. Ask your health care provider how much calcium you should have. General instructions  Get plenty of rest.  Most people can resume normal activities 1-2 days after the procedure. Ask your health care provider what activities are safe for you.  Your health care provider may direct you to lie in a certain position (postural drainage) and tap firmly (percuss) over your kidney area to help stone fragments pass. Follow instructions as told by your health care provider.  If directed, strain all urine through the strainer that was provided by your health care provider. ? Keep all fragments for your health care provider to see. Any stones that are found may be sent to a medical lab for examination. The stone may be as small as a grain of salt.  Keep all follow-up visits as told by your health care provider. This is important. Contact a health care provider if:  You have pain that is severe or does not get better with medicine.  You have nausea that is severe or does not go away.  You have blood in your urine longer than your health care provider told you to expect.  You have more blood in your urine.  You have pain during urination that does   not go away.  You urinate more frequently than usual and this does not go away.  You develop a rash or any other possible signs of an allergic reaction. Get help right away if:  You have severe pain in your back, sides, or upper abdomen.  You have severe pain while urinating.  Your urine is very dark red.  You have blood in your stool (feces).  You cannot pass any urine at all.  You feel a strong urge to urinate after emptying your bladder.  You have a fever or chills.  You develop shortness of breath,  difficulty breathing, or chest pain.  You have severe nausea that leads to persistent vomiting.  You faint. Summary  After this procedure, it is common to have some pain, discomfort, or nausea when pieces (fragments) of the kidney stone move through the tube that carries urine from the kidney to the bladder (ureter). If this pain or nausea is severe, however, you should contact your health care provider.  Most people can resume normal activities 1-2 days after the procedure. Ask your health care provider what activities are safe for you.  Drink enough water and fluids to keep your urine clear or pale yellow. This helps any remaining pieces of the stone to pass, and it can help prevent new stones from forming.  If directed, strain your urine and keep all fragments for your health care provider to see. Fragments or stones may be as small as a grain of salt.  Get help right away if you have severe pain in your back, sides, or upper abdomen or have severe pain while urinating. This information is not intended to replace advice given to you by your health care provider. Make sure you discuss any questions you have with your health care provider. Document Revised: 12/10/2018 Document Reviewed: 07/20/2016 Elsevier Patient Education  2020 [employer] See [facility] discharge instructions in chart. 

## 2020-09-17 NOTE — Op Note (Signed)
See [facility] note scanned into chart. 

## 2020-09-18 ENCOUNTER — Encounter (HOSPITAL_BASED_OUTPATIENT_CLINIC_OR_DEPARTMENT_OTHER): Payer: Self-pay | Admitting: Urology

## 2020-09-18 MED ORDER — RIFAXIMIN 550 MG PO TABS: 550.0000 mg | ORAL_TABLET | Freq: Three times a day (TID) | ORAL | 0 refills | Status: AC

## 2020-09-18 NOTE — Addendum Note (Signed)
Addended by: Aleatha Borer on: 09/18/2020 08:58 AM   Modules accepted: Orders

## 2020-09-18 NOTE — Telephone Encounter (Signed)
Sending through [facility] to see if Sharyne Peach will be cheaper, if not we can try to get samples for patient.

## 2020-09-22 NOTE — Telephone Encounter (Signed)
Patient has been notified that I have sent the prescription through [facility] which would try to get her co-pay lower. She is aware to contact me if the co-pay is still more than she can afford. I advised at that time I would see if we could get samples.

## 2020-09-23 ENCOUNTER — Ambulatory Visit
Admission: RE | Admit: 2020-09-23 | Discharge: 2020-09-23 | Disposition: A | Discharge status: Home / Self Care | Payer: Medicare HMO | Source: Ambulatory Visit | Attending: Family Medicine | Admitting: Family Medicine

## 2020-09-23 ENCOUNTER — Other Ambulatory Visit: Payer: Self-pay

## 2020-09-23 DIAGNOSIS — Z1239 Encounter for other screening for malignant neoplasm of breast: Secondary | ICD-10-CM

## 2020-09-23 DIAGNOSIS — Z1231 Encounter for screening mammogram for malignant neoplasm of breast: Secondary | ICD-10-CM | POA: Diagnosis not present

## 2020-10-01 NOTE — Telephone Encounter (Signed)
Returned patient's phone call and she has stated that [facility] would be charging her a co-pay over $800.  Patient has been advised that we did have samples and that they would be at the front for her to pick up.

## 2020-10-01 NOTE — Telephone Encounter (Signed)
Pt is requesting a call back from a nurse to discuss the samples.

## 2020-10-08 DIAGNOSIS — N201 Calculus of ureter: Secondary | ICD-10-CM | POA: Diagnosis not present

## 2020-10-16 NOTE — Progress Notes (Signed)
Subjective:   Mercedes Hodge is a 65 y.o. female who presents for an Initial Medicare Annual Wellness Visit.  Virtual Visit via Video Note  I connected with [name] by a video enabled telemedicine application and verified that I am speaking with the correct person using two identifiers.  Location: Patient: Home Provider: Office Persons participating in the virtual visit: patient, provider   I discussed the limitations of evaluation and management by telemedicine and the availability of in person appointments. The patient expressed understanding and agreed to proceed.     Larene Beach Annalee Meyerhoff,LPN    Review of Systems    N/A  Cardiac Risk Factors include: advanced age (>16men, >41 women);hypertension;dyslipidemia     Objective:    There were no vitals filed for this visit. There is no height or weight on file to calculate BMI.  Advanced Directives 10/19/2020 09/17/2020  Does Patient Have a Medical Advance Directive? Yes Yes  Type of Advance Directive Living will;Healthcare Power of Attorney -  Does patient want to make changes to medical advance directive? No - Patient declined No - Patient declined  Copy of [place] in Chart? No - copy requested -    Current Medications (verified) Outpatient Encounter Medications as of 10/19/2020  Medication Sig  . amLODipine-benazepril (LOTREL) 5-40 MG capsule Take 1 capsule by mouth daily.  . Cholecalciferol (VITAMIN D) 50 MCG (2000 UT) tablet Take 2,000 Units by mouth daily.  . diclofenac sodium (VOLTAREN) 1 % GEL Apply topically 4 (four) times daily.  Marland Kitchen escitalopram (LEXAPRO) 10 MG tablet Take 1 tablet (10 mg total) by mouth daily.  Marland Kitchen estradiol (ESTRACE) 0.1 MG/GM vaginal cream Place 1 Applicatorful vaginally 2 (two) times a week.  . fluticasone (FLONASE) 50 MCG/ACT nasal spray Place into both nostrils as needed for allergies or rhinitis.  . Fluticasone-Salmeterol 113-14 MCG/ACT AEPB Inhale 1 puff into the  lungs 2 (two) times daily.  . hydrochlorothiazide (HYDRODIURIL) 12.5 MG tablet TAKE 1 TABLET EVERY DAY  . hydroxychloroquine (PLAQUENIL) 200 MG tablet Take 2 tablets (400 mg total) by mouth daily.  . hyoscyamine (LEVSIN SL) 0.125 MG SL tablet Dissolve 1 tablet on the tongue every 4-6 hours and needed for abdominal cramping/diarrhea  . levothyroxine (SYNTHROID) 150 MCG tablet Take 1 tablet (150 mcg total) by mouth every other day.  . levothyroxine (SYNTHROID) 175 MCG tablet Take 1 tablet (175 mcg total) by mouth every other day.  . pravastatin (PRAVACHOL) 20 MG tablet TAKE 1 TABLET (20 MG TOTAL) BY MOUTH DAILY.  Marland Kitchen oxyCODONE-acetaminophen (PERCOCET) 5-325 MG tablet Take 1 tablet by mouth every 6 (six) hours as needed for severe pain. (Patient not taking: Reported on 10/19/2020)   No facility-administered encounter medications on file as of 10/19/2020.    Allergies (verified) Lovenox [enoxaparin sodium]   History: Past Medical History:  Diagnosis Date  . Allergy   . Arthritis   . Asthma   . Cancer ([place])    thyroid  . GERD (gastroesophageal reflux disease)   . Hyperlipidemia   . Hypertension   . Thyroid disease    Postsurgical hypothyroidism   Past Surgical History:  Procedure Laterality Date  . ABDOMINAL HYSTERECTOMY  1993  . CHOLECYSTECTOMY  1982  . EXTRACORPOREAL SHOCK WAVE LITHOTRIPSY Left 09/17/2020   Procedure: EXTRACORPOREAL SHOCK WAVE LITHOTRIPSY (ESWL);  Surgeon: Franchot Gallo, MD;  Location: [facility];  Service: Urology;  Laterality: Left;  . LAPAROSCOPIC GASTRIC SLEEVE RESECTION  07/2018  . left knee replacement  2008  . right ankle replacement  2012  . right knee replacement  2009  . trimallar FX  1995   right ankle   Family History  Problem Relation Age of Onset  . COPD Mother   . Heart disease Mother   . Cancer Father        thyroid cancer  . Juvenile Rhematoid Arthritis Sister   . Stroke Sister   . Thyroid cancer Brother   . Thyroid cancer  Daughter   . Healthy Daughter   . Healthy Son   . Healthy Daughter   . Colon cancer Neg Hx   . Esophageal cancer Neg Hx   . Pancreatic cancer Neg Hx   . Stomach cancer Neg Hx   . Liver disease Neg Hx    Social History   Socioeconomic History  . Marital status: Married    Spouse name: Not on file  . Number of children: Not on file  . Years of education: Not on file  . Highest education level: Not on file  Occupational History  . Not on file  Tobacco Use  . Smoking status: Never Smoker  . Smokeless tobacco: Never Used  Vaping Use  . Vaping Use: Never used  Substance and Sexual Activity  . Alcohol use: Not Currently    Comment: occ wine   . Drug use: Never  . Sexual activity: Yes    Birth control/protection: None  Other Topics Concern  . Not on file  Social History Narrative  . Not on file   Social Determinants of Health   Financial Resource Strain: Low Risk   . Difficulty of Paying Living Expenses: Not hard at all  Food Insecurity: No Food Insecurity  . Worried About Charity fundraiser in the Last Year: Never true  . Ran Out of Food in the Last Year: Never true  Transportation Needs: No Transportation Needs  . Lack of Transportation (Medical): No  . Lack of Transportation (Non-Medical): No  Physical Activity: Insufficiently Active  . Days of Exercise per Week: 4 days  . Minutes of Exercise per Session: 20 min  Stress: No Stress Concern Present  . Feeling of Stress : Not at all  Social Connections: Moderately Integrated  . Frequency of Communication with Friends and Family: More than three times a week  . Frequency of Social Gatherings with Friends and Family: Three times a week  . Attends Religious Services: More than 4 times per year  . Active Member of Clubs or Organizations: No  . Attends Archivist Meetings: Never  . Marital Status: Married    Tobacco Counseling Counseling given: Not Answered   Clinical Intake:  Pre-visit preparation  completed: Yes  Pain : No/denies pain     Nutritional Risks: None Diabetes: No  How often do you need to have someone help you when you read instructions, pamphlets, or other written materials from your doctor or pharmacy?: 1 - Never What is the last grade level you completed in school?: College  Diabetic?No   Interpreter Needed?: No  Information entered by :: [place] of Daily Living In your present state of health, do you have any difficulty performing the following activities: 10/19/2020 09/17/2020  Hearing? N N  Vision? N N  Difficulty concentrating or making decisions? N N  Walking or climbing stairs? N N  Dressing or bathing? N N  Doing errands, shopping? N -  Preparing Food and eating ? N -  Using the Toilet?  N -  In the past six months, have you accidently leaked urine? N -  Do you have problems with loss of bowel control? N -  Managing your Medications? N -  Managing your Finances? N -  Housekeeping or managing your Housekeeping? N -  Some recent data might be hidden    Patient Care Team: [place], Betty G, MD as PCP - General (Family Medicine) Germaine Pomfret, [facility] as Pharmacist (Pharmacist)  Indicate any recent Medical Services you may have received from other than [facility] providers in the past year (date may be approximate).     Assessment:   This is a routine wellness examination for [employer].  Hearing/Vision screen  Hearing Screening   125Hz  250Hz  500Hz  1000Hz  2000Hz  3000Hz  4000Hz  6000Hz  8000Hz   Right ear:           Left ear:           Vision Screening Comments: Patient states gets eyes examined once per year in April   Dietary issues and exercise activities discussed: Current Exercise Habits: Home exercise routine, Type of exercise: walking, Time (Minutes): 20, Frequency (Times/Week): 4, Weekly Exercise (Minutes/Week): 80, Intensity: Mild  Goals    . Chronic Care Management.     CARE PLAN ENTRY  Current Barriers:  . Chronic Disease  Management support, education, and care coordination needs related to  hypertension, asthma, dyslipidemia, rheumatoid arthritis, hypothyroidism, GERD  Clinical Goal(s): Over the next 180 days, patient will:  . Work with the care management team to address educational, disease management, and care coordination needs  . Begin or continue self health monitoring activities as directed today  . Call provider office for new or worsened signs and symptoms  . Call care management team with questions or concerns . Maintain blood pressure less than 130/80 . Maintain LDL (bad cholesterol) less than 100   Interventions:  . Evaluation of current treatment plans and patient's adherence to plan as established by provider . Assessed patient understanding of disease states . Assessed patient's education and care coordination needs . Provided disease specific education to patient     . Patient Stated     I will continue to use my exercise bike 2-3 times per week for at least 15-20 minutes      Depression Screen PHQ 2/9 Scores 10/19/2020 08/23/2020 09/11/2019  PHQ - 2 Score 0 0 0  PHQ- 9 Score - 0 0    Fall Risk Fall Risk  10/19/2020 02/17/2020 02/17/2020  Falls in the past year? 0 1 1  Number falls in past yr: 0 0 0  Injury with Fall? 0 1 1  Comment - - broken rib  Risk for fall due to : No Fall Risks History of fall(s) -  Follow up Falls evaluation completed;Falls prevention discussed Education provided -    FALL RISK PREVENTION PERTAINING TO THE HOME:  Any stairs in or around the home? No  If so, are there any without handrails? No  Home free of loose throw rugs in walkways, pet beds, electrical cords, etc? Yes  Adequate lighting in your home to reduce risk of falls? Yes   ASSISTIVE DEVICES UTILIZED TO PREVENT FALLS:  Life alert? No  Use of a cane, walker or w/c? No  Grab bars in the bathroom? No  Shower chair or bench in shower? Yes  Elevated toilet seat or a handicapped toilet? No     Cognitive Function:   Normal cognitive status assessed by direct observation by this Nurse Health  Advisor. No abnormalities found.        Immunizations Immunization History  Administered Date(s) Administered  . Influenza-Unspecified 05/22/2019, 06/10/2020  . PFIZER(Purple Top)SARS-COV-2 Vaccination 11/17/2019, 12/08/2019, 06/10/2020  . Pneumococcal Conjugate-13 06/07/2019  . Pneumococcal Polysaccharide-23 08/18/2020  . Tdap 06/07/2019    TDAP status: Up to date  Flu Vaccine status: Up to date  Pneumococcal vaccine status: Up to date  Covid-19 vaccine status: Completed vaccines  Qualifies for Shingles Vaccine? Yes   Zostavax completed No   Shingrix Completed?: No.    Education has been provided regarding the importance of this vaccine. Patient has been advised to call insurance company to determine out of pocket expense if they have not yet received this vaccine. Advised may also receive vaccine at local pharmacy or Health Dept. Verbalized acceptance and understanding.  Screening Tests Health Maintenance  Topic Date Due  . COLONOSCOPY (Pts 45-18yrs Insurance coverage will need to be confirmed)  07/06/2021 (Originally 10/15/1998)  . COVID-19 Vaccine (4 - Booster for Pfizer series) 12/08/2020  . MAMMOGRAM  09/23/2021  . TETANUS/TDAP  06/06/2029  . INFLUENZA VACCINE  Completed  . DEXA SCAN  Completed  . Hepatitis C Screening  Completed  . PNA vac Low Risk Adult  Completed    Health Maintenance  There are no preventive care reminders to display for this patient.  Colorectal cancer screening: Type of screening: Colonoscopy. Completed 07/06/2014. Repeat every 10 years  Mammogram status: Completed 09/23/2020. Repeat every year  Bone Density status: Completed 02/04/2020. Results reflect: Bone density results: OSTEOPENIA. Repeat every 2 years.  Lung Cancer Screening: (Low Dose CT Chest recommended if Age 23-80 years, 30 pack-year currently smoking OR have quit w/in  15years.) does not qualify.   Lung Cancer Screening Referral: N/A   Additional Screening:  Hepatitis C Screening: does qualify; Completed 02/17/2020   Vision Screening: Recommended annual ophthalmology exams for early detection of glaucoma and other disorders of the eye. Is the patient up to date with their annual eye exam?  Yes  Who is the provider or what is the name of the office in which the patient attends annual eye exams? Dr.Groat  If pt is not established with a provider, would they like to be referred to a provider to establish care? No .   Dental Screening: Recommended annual dental exams for proper oral hygiene  Community Resource Referral / Chronic Care Management: CRR required this visit?  No   CCM required this visit?  No      Plan:     I have personally reviewed and noted the following in the patient's chart:   . Medical and social history . Use of alcohol, tobacco or illicit drugs  . Current medications and supplements . Functional ability and status . Nutritional status . Physical activity . Advanced directives . List of other physicians . Hospitalizations, surgeries, and ER visits in previous 12 months . Vitals . Screenings to include cognitive, depression, and falls . Referrals and appointments  In addition, I have reviewed and discussed with patient certain preventive protocols, quality metrics, and best practice recommendations. A written personalized care plan for preventive services as well as general preventive health recommendations were provided to patient.     Ofilia Neas, LPN   [id number]   Nurse Notes: None

## 2020-10-19 ENCOUNTER — Ambulatory Visit (INDEPENDENT_AMBULATORY_CARE_PROVIDER_SITE_OTHER): Payer: Medicare HMO

## 2020-10-19 DIAGNOSIS — Z0000 Encounter for general adult medical examination without abnormal findings: Secondary | ICD-10-CM

## 2020-10-19 NOTE — Patient Instructions (Signed)
Mercedes Hodge , Thank you for taking time to come for your Medicare Wellness Visit. I appreciate your ongoing commitment to your health goals. Please review the following plan we discussed and let me know if I can assist you in the future.   Screening recommendations/referrals: Colonoscopy: Please have records from your previous Colonoscopy sent to our office so that we may scan them into your chart.  Mammogram: Up to date, next due 09/23/2021 Bone Density: Up to date, next due 02/03/2022 Recommended yearly ophthalmology/optometry visit for glaucoma screening and checkup Recommended yearly dental visit for hygiene and checkup  Vaccinations: Influenza vaccine: Up to date, next due fall 2022  Pneumococcal vaccine: Completed series  Tdap vaccine: Up to date, next due 06/06/2029 Shingles vaccine: Currently due for Shingrix, if you wish to receive we recommend that you do so at your local pharmacy as it is less expensive.    Advanced directives: Please bring copies of your Advanced medical directives so that we may scan them into your chart.   Conditions/risks identified: None   Next appointment: 06/22/2021 @ 10:00 am with Pharmacist at [facility] 67 Years and Older, Female Preventive care refers to lifestyle choices and visits with your health care provider that can promote health and wellness. What does preventive care include?  A yearly physical exam. This is also called an annual well check.  Dental exams once or twice a year.  Routine eye exams. Ask your health care provider how often you should have your eyes checked.  Personal lifestyle choices, including:  Daily care of your teeth and gums.  Regular physical activity.  Eating a healthy diet.  Avoiding tobacco and drug use.  Limiting alcohol use.  Practicing safe sex.  Taking low-dose aspirin every day.  Taking vitamin and mineral supplements as recommended by your health care  provider. What happens during an annual well check? The services and screenings done by your health care provider during your annual well check will depend on your age, overall health, lifestyle risk factors, and family history of disease. Counseling  Your health care provider may ask you questions about your:  Alcohol use.  Tobacco use.  Drug use.  Emotional well-being.  Home and relationship well-being.  Sexual activity.  Eating habits.  History of falls.  Memory and ability to understand (cognition).  Work and work Statistician.  Reproductive health. Screening  You may have the following tests or measurements:  Height, weight, and BMI.  Blood pressure.  Lipid and cholesterol levels. These may be checked every 5 years, or more frequently if you are over 34 years old.  Skin check.  Lung cancer screening. You may have this screening every year starting at age 67 if you have a 30-pack-year history of smoking and currently smoke or have quit within the past 15 years.  Fecal occult blood test (FOBT) of the stool. You may have this test every year starting at age 67.  Flexible sigmoidoscopy or colonoscopy. You may have a sigmoidoscopy every 5 years or a colonoscopy every 10 years starting at age 67.  Hepatitis C blood test.  Hepatitis B blood test.  Sexually transmitted disease (STD) testing.  Diabetes screening. This is done by checking your blood sugar (glucose) after you have not eaten for a while (fasting). You may have this done every 1-3 years.  Bone density scan. This is done to screen for osteoporosis. You may have this done starting at age 67.  Mammogram. This may  be done every 1-2 years. Talk to your health care provider about how often you should have regular mammograms. Talk with your health care provider about your test results, treatment options, and if necessary, the need for more tests. Vaccines  Your health care provider may recommend certain  vaccines, such as:  Influenza vaccine. This is recommended every year.  Tetanus, diphtheria, and acellular pertussis (Tdap, Td) vaccine. You may need a Td booster every 10 years.  Zoster vaccine. You may need this after age 67.  Pneumococcal 13-valent conjugate (PCV13) vaccine. One dose is recommended after age 67.  Pneumococcal polysaccharide (PPSV23) vaccine. One dose is recommended after age 67. Talk to your health care provider about which screenings and vaccines you need and how often you need them. This information is not intended to replace advice given to you by your health care provider. Make sure you discuss any questions you have with your health care provider. Document Released: 09/25/2015 Document Revised: 05/18/2016 Document Reviewed: 06/30/2015 Elsevier Interactive Patient Education  2017 [place] Prevention in the Home Falls can cause injuries. They can happen to people of all ages. There are many things you can do to make your home safe and to help prevent falls. What can I do on the outside of my home?  Regularly fix the edges of walkways and driveways and fix any cracks.  Remove anything that might make you trip as you walk through a door, such as a raised step or threshold.  Trim any bushes or trees on the path to your home.  Use bright outdoor lighting.  Clear any walking paths of anything that might make someone trip, such as rocks or tools.  Regularly check to see if handrails are loose or broken. Make sure that both sides of any steps have handrails.  Any raised decks and porches should have guardrails on the edges.  Have any leaves, snow, or ice cleared regularly.  Use sand or salt on walking paths during winter.  Clean up any spills in your garage right away. This includes oil or grease spills. What can I do in the bathroom?  Use night lights.  Install grab bars by the toilet and in the tub and shower. Do not use towel bars as grab  bars.  Use non-skid mats or decals in the tub or shower.  If you need to sit down in the shower, use a plastic, non-slip stool.  Keep the floor dry. Clean up any water that spills on the floor as soon as it happens.  Remove soap buildup in the tub or shower regularly.  Attach bath mats securely with double-sided non-slip rug tape.  Do not have throw rugs and other things on the floor that can make you trip. What can I do in the bedroom?  Use night lights.  Make sure that you have a light by your bed that is easy to reach.  Do not use any sheets or blankets that are too big for your bed. They should not hang down onto the floor.  Have a firm chair that has side arms. You can use this for support while you get dressed.  Do not have throw rugs and other things on the floor that can make you trip. What can I do in the kitchen?  Clean up any spills right away.  Avoid walking on wet floors.  Keep items that you use a lot in easy-to-reach places.  If you need to reach something above you,  use a strong step stool that has a grab bar.  Keep electrical cords out of the way.  Do not use floor polish or wax that makes floors slippery. If you must use wax, use non-skid floor wax.  Do not have throw rugs and other things on the floor that can make you trip. What can I do with my stairs?  Do not leave any items on the stairs.  Make sure that there are handrails on both sides of the stairs and use them. Fix handrails that are broken or loose. Make sure that handrails are as long as the stairways.  Check any carpeting to make sure that it is firmly attached to the stairs. Fix any carpet that is loose or worn.  Avoid having throw rugs at the top or bottom of the stairs. If you do have throw rugs, attach them to the floor with carpet tape.  Make sure that you have a light switch at the top of the stairs and the bottom of the stairs. If you do not have them, ask someone to add them for  you. What else can I do to help prevent falls?  Wear shoes that:  Do not have high heels.  Have rubber bottoms.  Are comfortable and fit you well.  Are closed at the toe. Do not wear sandals.  If you use a stepladder:  Make sure that it is fully opened. Do not climb a closed stepladder.  Make sure that both sides of the stepladder are locked into place.  Ask someone to hold it for you, if possible.  Clearly mark and make sure that you can see:  Any grab bars or handrails.  First and last steps.  Where the edge of each step is.  Use tools that help you move around (mobility aids) if they are needed. These include:  Canes.  Walkers.  Scooters.  Crutches.  Turn on the lights when you go into a dark area. Replace any light bulbs as soon as they burn out.  Set up your furniture so you have a clear path. Avoid moving your furniture around.  If any of your floors are uneven, fix them.  If there are any pets around you, be aware of where they are.  Review your medicines with your doctor. Some medicines can make you feel dizzy. This can increase your chance of falling. Ask your doctor what other things that you can do to help prevent falls. This information is not intended to replace advice given to you by your health care provider. Make sure you discuss any questions you have with your health care provider. Document Released: 06/25/2009 Document Revised: 02/04/2016 Document Reviewed: 10/03/2014 Elsevier Interactive Patient Education  2017 [employer].

## 2020-10-29 ENCOUNTER — Other Ambulatory Visit: Payer: Self-pay | Admitting: *Deleted

## 2020-10-29 ENCOUNTER — Encounter: Payer: Self-pay | Admitting: Rheumatology

## 2020-10-29 DIAGNOSIS — M0609 Rheumatoid arthritis without rheumatoid factor, multiple sites: Secondary | ICD-10-CM

## 2020-10-29 DIAGNOSIS — Z79899 Other long term (current) drug therapy: Secondary | ICD-10-CM

## 2020-10-29 MED ORDER — HYDROXYCHLOROQUINE SULFATE 200 MG PO TABS: 400.0000 mg | ORAL_TABLET | Freq: Every day | ORAL | 0 refills | Status: DC

## 2020-10-29 NOTE — Telephone Encounter (Signed)
Last Visit: 10/182021 Next Visit: 11/27/2020 Labs: 06/29/2020, CBC and CMP normal. Eye exam: 12/26/2019 WNL  Current Dose per office note 06/29/2020, PLQ 400 mg p.o. daily [id number] arthritis of multiple sites with negative rheumatoid factor   Last Fill: 06/30/2020  Okay to refill Plaquenil?

## 2020-11-09 NOTE — Progress Notes (Signed)
Reviewed and agree with documentation and assessment and plan. K. Veena Bharat [place] , MD   

## 2020-11-13 NOTE — Progress Notes (Signed)
Office Visit Note  Patient: Mercedes Hodge             Date of Birth: [birth year]           MRN: [id number]             PCP: Martinique, Betty G, MD Referring: Martinique, Betty G, MD Visit Date: 11/27/2020 Occupation: @GUAROCC @  Subjective:  Pain in both hands   History of Present Illness: Mercedes Hodge is a 65 y.o. female with history of seronegative rheumatoid arthritis, osteoarthritis, and osteopenia.  She is taking plaquenil 200 mg 1 tablet by mouth twice daily.  She is tolerating Plaquenil without any side effects.  Patient reports that she has been having increased pain and stiffness in both hands over the past several weeks.  She states that her morning stiffness has been lasting 30 to 45 minutes daily.  She has also noticed intermittent joint swelling.  She states the pain is most severe in her left CMC joint.  She denies any nocturnal pain.  She states that she continues to craft on a daily basis which helps with her joint stiffness.  She uses Voltaren gel topically as needed for pain relief.  She has also been wearing a left CMC joint brace as well as arthritis compression gloves with copper as needed for pain relief.  She denies any other joint pain or joint swelling at this time. She has been experiencing intermittent cramping and diarrhea.  She is following up with GI and undergoing a thorough work-up at this time. She denies any fevers, nausea, or vomiting.     Activities of Daily Living:  Patient reports morning stiffness for 30-45 minutes.   Patient Denies nocturnal pain.  Difficulty dressing/grooming: Denies Difficulty climbing stairs: Denies Difficulty getting out of chair: Denies Difficulty using hands for taps, buttons, cutlery, and/or writing: Denies  Review of Systems  Constitutional: Negative for fatigue.  HENT: Negative for mouth sores, mouth dryness and nose dryness.   Eyes: Negative for pain, visual disturbance and dryness.  Respiratory: Negative for cough,  hemoptysis, shortness of breath and difficulty breathing.   Cardiovascular: Negative for chest pain, palpitations, hypertension and swelling in legs/feet.  Gastrointestinal: Positive for blood in stool and diarrhea. Negative for constipation.  Endocrine: Negative for increased urination.  Genitourinary: Negative for painful urination.  Musculoskeletal: Positive for arthralgias, joint pain and morning stiffness. Negative for joint swelling, myalgias, muscle weakness, muscle tenderness and myalgias.  Skin: Negative for color change, pallor, rash, hair loss, nodules/bumps, skin tightness, ulcers and sensitivity to sunlight.  Allergic/Immunologic: Negative for susceptible to infections.  Neurological: Negative for dizziness, numbness, headaches and weakness.  Hematological: Negative for swollen glands.  Psychiatric/Behavioral: Negative for depressed mood and sleep disturbance. The patient is not nervous/anxious.     PMFS History:  Patient Active Problem List   Diagnosis Date Noted  . Aortic atherosclerosis ([place]) 08/18/2020  . History of colonic polyps 07/20/2020  . Depression, major, single episode, mild ([place]) 02/17/2020  . Atrophic vaginitis 02/17/2020  . Rheumatoid arthritis of multiple sites with negative rheumatoid factor ([place]) 07/02/2019  . High risk medication use 07/02/2019  . Primary osteoarthritis of both hands 07/02/2019  . Status post total bilateral knee replacement 07/02/2019  . Status post right ankle joint replacement 07/02/2019  . Essential hypertension 07/02/2019  . History of asthma 07/02/2019  . History of thyroid cancer 07/02/2019  . Postoperative hypothyroidism 07/02/2019  . Dyslipidemia 07/02/2019  . Gastroesophageal reflux disease without esophagitis 07/02/2019  .  Osteopenia of multiple sites 07/02/2019    Past Medical History:  Diagnosis Date  . Allergy   . Arthritis   . Asthma   . Cancer ([place])    thyroid  . GERD (gastroesophageal reflux disease)   .  Hyperlipidemia   . Hypertension   . Thyroid disease    Postsurgical hypothyroidism    Family History  Problem Relation Age of Onset  . COPD Mother   . Heart disease Mother   . Cancer Father        thyroid cancer  . Juvenile Rhematoid Arthritis Sister   . Stroke Sister   . Thyroid cancer Brother   . Thyroid cancer Daughter   . Healthy Daughter   . Healthy Son   . Healthy Daughter   . Colon cancer Neg Hx   . Esophageal cancer Neg Hx   . Pancreatic cancer Neg Hx   . Stomach cancer Neg Hx   . Liver disease Neg Hx    Past Surgical History:  Procedure Laterality Date  . ABDOMINAL HYSTERECTOMY  1993  . CHOLECYSTECTOMY  1982  . EXTRACORPOREAL SHOCK WAVE LITHOTRIPSY Left 09/17/2020   Procedure: EXTRACORPOREAL SHOCK WAVE LITHOTRIPSY (ESWL);  Surgeon: Franchot Gallo, MD;  [facility];  Service: Urology;  Laterality: Left;  . LAPAROSCOPIC GASTRIC SLEEVE RESECTION  07/2018  . left knee replacement  2008  . right ankle replacement  2012  . right knee replacement  2009  . trimallar FX  1995   right ankle   Social History   Social History Narrative  . Not on file   Immunization History  Administered Date(s) Administered  . Influenza-Unspecified 05/22/2019, 06/10/2020  . PFIZER(Purple Top)SARS-COV-2 Vaccination 11/17/2019, 12/08/2019, 06/10/2020  . Pneumococcal Conjugate-13 06/07/2019  . Pneumococcal Polysaccharide-23 08/18/2020  . Tdap 06/07/2019     Objective: Vital Signs: BP 110/74 (BP Location: Left Arm, Patient Position: Sitting, Cuff Size: Normal)   Pulse 76   Resp 15   Ht 5\' 7"  (1.702 m)   BMI 32.75 kg/m    Physical Exam Vitals and nursing note reviewed.  Constitutional:      Appearance: She is well-developed.  HENT:     Head: Normocephalic and atraumatic.  Eyes:     Conjunctiva/sclera: Conjunctivae normal.  Pulmonary:     Effort: Pulmonary effort is normal.  Abdominal:     Palpations: Abdomen is soft.  Musculoskeletal:      Cervical back: Normal range of motion.  Skin:    General: Skin is warm and dry.     Capillary Refill: Capillary refill takes less than 2 seconds.  Neurological:     Mental Status: She is alert and oriented to person, place, and time.  Psychiatric:        Behavior: Behavior normal.      Musculoskeletal Exam: C-spine, thoracic spine, and lumbar spine good ROM.  Shoulder joints, elbow joints, wrist joints, MCPs, PIPs, and DIPs good ROM with no synovitis.  Complete fist formation bilaterally. Right middle trigger finger.  Tenderness over the right 3rd MCP.  Tenderness and thickening over the left CMC joint.  Hip joints good ROM with no discomfort. No trochanteric bursa tenderness.   Knee joints good ROM with no warmth or effusion. Limited ROM of the right ankle. No tenderness to palpation over ankle joints.   CDAI Exam: CDAI Score: 1.4  Patient Global: 2 mm; Provider Global: 2 mm Swollen: 0 ; Tender: 2  Joint Exam 11/27/2020      Right  Left  CMC      Tender  MCP 3   Tender        Investigation: No additional findings.  Imaging: No results found.  Recent Labs: Lab Results  Component Value Date   WBC 7.4 06/29/2020   HGB 13.7 06/29/2020   PLT 179 06/29/2020   NA 137 06/29/2020   K 3.6 06/29/2020   CL 102 06/29/2020   CO2 28 06/29/2020   GLUCOSE 85 06/29/2020   BUN 15 06/29/2020   CREATININE 0.74 06/29/2020   BILITOT 0.9 06/29/2020   AST 19 06/29/2020   ALT 13 06/29/2020   PROT 6.4 06/29/2020   CALCIUM 9.3 06/29/2020   GFRAA 98 06/29/2020    Speciality Comments: Plaquenil eye exam: 12/26/2019 WNL Follow up in 1 year  Procedures:  No procedures performed Allergies: Lovenox [enoxaparin sodium]   Assessment / Plan:     Visit Diagnoses: Rheumatoid arthritis of multiple sites with negative rheumatoid factor ([place]) - Positive anti-CCP (repeat negative) and elevated CRP, Diagnosed in [place] by her rheumatologist. Hx of inflammatory arthritis: She has no synovitis on  exam.  She has been experiencing increased pain and stiffness in both hands over the past several weeks.  Her pain is most severe in the left [facility] joint which is tender and thickened on examination today.  She is able to make a complete fist bilaterally.  She has PIP and DIP thickening consistent with osteoarthritis of both hands.  Tenderness over the right third MCP but no synovitis was noted.  She is overall clinically doing well taking Plaquenil 200 mg 1 tablet by mouth twice daily.  She is tolerating Plaquenil without any side effects.  She is not experiencing any other joint pain or joint swelling at this time.  We discussed updating x-rays of both hands and both feet at her follow-up visit to assess for radiographic progression.  We also discussed the importance of joint protection and muscle strengthening.  She was encouraged to continue to use arthritis compression gloves as well as her left CMC joint brace.  She declined a left CMC joint cortisone injection at this time.  She will continue taking Plaquenil as prescribed.  She was advised to notify us if she develops increased joint pain or joint swelling.  She will follow-up in the office in 5 months.  High risk medication use - Plaquenil 200 mg 1 tablet by mouth twice daily.  Plaquenil eye exam: 12/26/2019.  Her next Plaquenil eye exam is scheduled in April 2022.  She was given a Plaquenil eye exam form to take with her to her upcoming appointment.CBC and CMP drawn on 06/29/20.  She is due to update lab work.- Plan: CBC with Differential/Platelet, COMPLETE METABOLIC PANEL WITH GFR  Primary osteoarthritis of both hands: She has PIP and DIP thickening consistent with osteoarthritis of both hands.  She has tenderness and thickening over the left CMC joint.  She has been experiencing increased pain and stiffness in both hands over the past several weeks.  Her morning stiffness has been lasting 30 to 45 minutes daily.  She has been grafting on a daily basis  which has helped with her fine motor skills and strength.  She has no synovitis on examination today.  She has been using Voltaren gel topically as needed for pain relief as well as using a left CMC joint brace and arthritis copper fit gloves.  We discussed the importance of joint protection and muscle strengthening.  She was given a handout  of hand exercises to perform.  We also discussed natural anti-inflammatories but she did not find these to be effective in the past.  We will update x-rays at her next follow-up visit.  She was advised to notify us if she develops increased joint pain or joint swelling.  Trigger finger, right middle finger - She has intermittent tenderness and locking of the right middle finger.  We discussed conservative treatment options including using a splint or buddy taping to the adjacent finger.  She can also use Voltaren gel topically as needed.  She declined a cortisone injection at this time.  Trochanteric bursitis of both hips: Resolved.  She has no tenderness to palpation.  Status post total bilateral knee replacement: Doing well.  She has good range of motion with no discomfort.  No warmth or effusion was noted.  She has no difficulty climbing steps or rising from a seated position.  Status post right ankle joint replacement - History of Charcot joint which required total ankle replacement.  Slightly limited range of motion with thickening.  No tenderness or inflammation was noted.  She wears proper fitting shoes.  Osteopenia of multiple sites - DEXA from May 2021 which showed a T score of -1.1.  She is taking vitamin D 2000 units daily.  Diarrhea of presumed infectious origin: She has been experiencing intermittent diarrhea and abdominal cramping.  She is undergoing a thorough work-up by GI currently.  Other medical conditions are listed as follows:  Essential hypertension  History of thyroid cancer - She requested to have TSH checked today.  We will forward  results to her PCP once complete.  Plan: TSH  History of asthma  Dyslipidemia  Gastroesophageal reflux disease without esophagitis  Postoperative hypothyroidism -She requested to have TSH checked today and forwarded to her PCP once resulted.  Plan: TSH  Orders: Orders Placed This Encounter  Procedures  . CBC with Differential/Platelet  . COMPLETE METABOLIC PANEL WITH GFR  . TSH   Meds ordered this encounter  Medications  . hydroxychloroquine (PLAQUENIL) 200 MG tablet    Sig: Take 2 tablets (400 mg total) by mouth daily.    Dispense:  180 tablet    Refill:  0      Follow-Up Instructions: Return in about 5 months (around 04/29/2021) for Rheumatoid arthritis, Osteoarthritis.   Ofilia Neas, PA-C  Note - This record has been created using Dragon software.  Chart creation errors have been sought, but may not always  have been located. Such creation errors do not reflect on  the standard of medical care.

## 2020-11-27 ENCOUNTER — Encounter: Payer: Self-pay | Admitting: Physician Assistant

## 2020-11-27 ENCOUNTER — Ambulatory Visit: Payer: Medicare HMO | Admitting: Physician Assistant

## 2020-11-27 ENCOUNTER — Other Ambulatory Visit: Payer: Self-pay

## 2020-11-27 VITALS — BP 110/74 | HR 76 | Resp 15 | Ht 67.0 in | Wt 208.2 lb

## 2020-11-27 DIAGNOSIS — Z96653 Presence of artificial knee joint, bilateral: Secondary | ICD-10-CM

## 2020-11-27 DIAGNOSIS — M8589 Other specified disorders of bone density and structure, multiple sites: Secondary | ICD-10-CM

## 2020-11-27 DIAGNOSIS — E890 Postprocedural hypothyroidism: Secondary | ICD-10-CM

## 2020-11-27 DIAGNOSIS — M65331 Trigger finger, right middle finger: Secondary | ICD-10-CM

## 2020-11-27 DIAGNOSIS — Z96661 Presence of right artificial ankle joint: Secondary | ICD-10-CM

## 2020-11-27 DIAGNOSIS — Z85850 Personal history of malignant neoplasm of thyroid: Secondary | ICD-10-CM | POA: Diagnosis not present

## 2020-11-27 DIAGNOSIS — M19042 Primary osteoarthritis, left hand: Secondary | ICD-10-CM

## 2020-11-27 DIAGNOSIS — I10 Essential (primary) hypertension: Secondary | ICD-10-CM | POA: Diagnosis not present

## 2020-11-27 DIAGNOSIS — M7061 Trochanteric bursitis, right hip: Secondary | ICD-10-CM

## 2020-11-27 DIAGNOSIS — M19041 Primary osteoarthritis, right hand: Secondary | ICD-10-CM | POA: Diagnosis not present

## 2020-11-27 DIAGNOSIS — Z79899 Other long term (current) drug therapy: Secondary | ICD-10-CM | POA: Diagnosis not present

## 2020-11-27 DIAGNOSIS — E785 Hyperlipidemia, unspecified: Secondary | ICD-10-CM

## 2020-11-27 DIAGNOSIS — K219 Gastro-esophageal reflux disease without esophagitis: Secondary | ICD-10-CM

## 2020-11-27 DIAGNOSIS — M0609 Rheumatoid arthritis without rheumatoid factor, multiple sites: Secondary | ICD-10-CM

## 2020-11-27 DIAGNOSIS — M7062 Trochanteric bursitis, left hip: Secondary | ICD-10-CM

## 2020-11-27 DIAGNOSIS — R197 Diarrhea, unspecified: Secondary | ICD-10-CM

## 2020-11-27 DIAGNOSIS — Z8709 Personal history of other diseases of the respiratory system: Secondary | ICD-10-CM

## 2020-11-27 MED ORDER — HYDROXYCHLOROQUINE SULFATE 200 MG PO TABS: 400.0000 mg | ORAL_TABLET | Freq: Every day | ORAL | 0 refills | Status: DC

## 2020-11-27 NOTE — Patient Instructions (Signed)
Hand Exercises Hand exercises can be helpful for almost anyone. These exercises can strengthen the hands, improve flexibility and movement, and increase blood flow to the hands. These results can make work and daily tasks easier. Hand exercises can be especially helpful for people who have joint pain from arthritis or have nerve damage from overuse (carpal tunnel syndrome). These exercises can also help people who have injured a hand. Exercises Most of these hand exercises are gentle stretching and motion exercises. It is usually safe to do them often throughout the day. Warming up your hands before exercise may help to reduce stiffness. You can do this with gentle massage or by placing your hands in warm water for 10-15 minutes. It is normal to feel some stretching, pulling, tightness, or mild discomfort as you begin new exercises. This will gradually improve. Stop an exercise right away if you feel sudden, severe pain or your pain gets worse. Ask your health care provider which exercises are best for you. Knuckle bend or "claw" fist 1. Stand or sit with your arm, hand, and all five fingers pointed straight up. Make sure to keep your wrist straight during the exercise. 2. Gently bend your fingers down toward your palm until the tips of your fingers are touching the top of your palm. Keep your big knuckle straight and just bend the small knuckles in your fingers. 3. Hold this position for __________ seconds. 4. Straighten (extend) your fingers back to the starting position. Repeat this exercise 5-10 times with each hand. Full finger fist 1. Stand or sit with your arm, hand, and all five fingers pointed straight up. Make sure to keep your wrist straight during the exercise. 2. Gently bend your fingers into your palm until the tips of your fingers are touching the middle of your palm. 3. Hold this position for __________ seconds. 4. Extend your fingers back to the starting position, stretching every  joint fully. Repeat this exercise 5-10 times with each hand. Straight fist 1. Stand or sit with your arm, hand, and all five fingers pointed straight up. Make sure to keep your wrist straight during the exercise. 2. Gently bend your fingers at the big knuckle, where your fingers meet your hand, and the middle knuckle. Keep the knuckle at the tips of your fingers straight and try to touch the bottom of your palm. 3. Hold this position for __________ seconds. 4. Extend your fingers back to the starting position, stretching every joint fully. Repeat this exercise 5-10 times with each hand. Tabletop 1. Stand or sit with your arm, hand, and all five fingers pointed straight up. Make sure to keep your wrist straight during the exercise. 2. Gently bend your fingers at the big knuckle, where your fingers meet your hand, as far down as you can while keeping the small knuckles in your fingers straight. Think of forming a tabletop with your fingers. 3. Hold this position for __________ seconds. 4. Extend your fingers back to the starting position, stretching every joint fully. Repeat this exercise 5-10 times with each hand. Finger spread 1. Place your hand flat on a table with your palm facing down. Make sure your wrist stays straight as you do this exercise. 2. Spread your fingers and thumb apart from each other as far as you can until you feel a gentle stretch. Hold this position for __________ seconds. 3. Bring your fingers and thumb tight together again. Hold this position for __________ seconds. Repeat this exercise 5-10 times with each hand.   Making circles 1. Stand or sit with your arm, hand, and all five fingers pointed straight up. Make sure to keep your wrist straight during the exercise. 2. Make a circle by touching the tip of your thumb to the tip of your index finger. 3. Hold for __________ seconds. Then open your hand wide. 4. Repeat this motion with your thumb and each finger on your  hand. Repeat this exercise 5-10 times with each hand. Thumb motion 1. Sit with your forearm resting on a table and your wrist straight. Your thumb should be facing up toward the ceiling. Keep your fingers relaxed as you move your thumb. 2. Lift your thumb up as high as you can toward the ceiling. Hold for __________ seconds. 3. Bend your thumb across your palm as far as you can, reaching the tip of your thumb for the small finger (pinkie) side of your palm. Hold for __________ seconds. Repeat this exercise 5-10 times with each hand. Grip strengthening 1. Hold a stress ball or other soft ball in the middle of your hand. 2. Slowly increase the pressure, squeezing the ball as much as you can without causing pain. Think of bringing the tips of your fingers into the middle of your palm. All of your finger joints should bend when doing this exercise. 3. Hold your squeeze for __________ seconds, then relax. Repeat this exercise 5-10 times with each hand.   Contact a health care provider if:  Your hand pain or discomfort gets much worse when you do an exercise.  Your hand pain or discomfort does not improve within 2 hours after you exercise. If you have any of these problems, stop doing these exercises right away. Do not do them again unless your health care provider says that you can. Get help right away if:  You develop sudden, severe hand pain or swelling. If this happens, stop doing these exercises right away. Do not do them again unless your health care provider says that you can. This information is not intended to replace advice given to you by your health care provider. Make sure you discuss any questions you have with your health care provider. Document Revised: 12/20/2018 Document Reviewed: 08/30/2018 Elsevier Patient Education  2021 Elsevier Inc.  

## 2020-11-28 LAB — CBC WITH DIFFERENTIAL/PLATELET
Absolute Monocytes: 730 cells/uL (ref 200–950)
Basophils Absolute: 58 cells/uL (ref 0–200)
Basophils Relative: 0.8 %
Eosinophils Absolute: 248 cells/uL (ref 15–500)
Eosinophils Relative: 3.4 %
HCT: 40.1 % (ref 35.0–45.0)
Hemoglobin: 13.6 g/dL (ref 11.7–15.5)
Lymphs Abs: 1716 cells/uL (ref 850–3900)
MCH: 32.5 pg (ref 27.0–33.0)
MCHC: 33.9 g/dL (ref 32.0–36.0)
MCV: 95.9 fL (ref 80.0–100.0)
MPV: 9.6 fL (ref 7.5–12.5)
Monocytes Relative: 10 %
Neutro Abs: 4548 cells/uL (ref 1500–7800)
Neutrophils Relative %: 62.3 %
Platelets: 252 10*3/uL (ref 140–400)
RBC: 4.18 10*6/uL (ref 3.80–5.10)
RDW: 11.8 % (ref 11.0–15.0)
Total Lymphocyte: 23.5 %
WBC: 7.3 10*3/uL (ref 3.8–10.8)

## 2020-11-28 LAB — COMPLETE METABOLIC PANEL WITH GFR
AG Ratio: 1.4 (calc) (ref 1.0–2.5)
ALT: 15 U/L (ref 6–29)
AST: 19 U/L (ref 10–35)
Albumin: 3.8 g/dL (ref 3.6–5.1)
Alkaline phosphatase (APISO): 54 U/L (ref 37–153)
BUN: 15 mg/dL (ref 7–25)
CO2: 27 mmol/L (ref 20–32)
Calcium: 9 mg/dL (ref 8.6–10.4)
Chloride: 101 mmol/L (ref 98–110)
Creat: 0.77 mg/dL (ref 0.50–0.99)
GFR, Est African American: 93 mL/min/{1.73_m2} (ref >=60)
GFR, Est Non African American: 80 mL/min/{1.73_m2} (ref >=60)
Globulin: 2.8 g/dL (calc) (ref 1.9–3.7)
Glucose, Bld: 86 mg/dL (ref 65–99)
Potassium: 4 mmol/L (ref 3.5–5.3)
Sodium: 138 mmol/L (ref 135–146)
Total Bilirubin: 0.4 mg/dL (ref 0.2–1.2)
Total Protein: 6.6 g/dL (ref 6.1–8.1)

## 2020-11-28 LAB — TSH: TSH: 0.05 mIU/L — ABNORMAL LOW (ref 0.40–4.50)

## 2020-11-30 ENCOUNTER — Encounter: Payer: Self-pay | Admitting: Family Medicine

## 2020-11-30 DIAGNOSIS — E890 Postprocedural hypothyroidism: Secondary | ICD-10-CM

## 2020-11-30 NOTE — Progress Notes (Signed)
CBC and CMP WNL. TSH is low-0.05. Please forward to PCP.

## 2020-12-01 NOTE — Telephone Encounter (Signed)
Can you advise if dosage needs to be changed? Lab is in [place].

## 2020-12-07 ENCOUNTER — Other Ambulatory Visit: Payer: Self-pay | Admitting: Family Medicine

## 2020-12-07 DIAGNOSIS — E890 Postprocedural hypothyroidism: Secondary | ICD-10-CM

## 2020-12-07 MED ORDER — LEVOTHYROXINE SODIUM 175 MCG PO TABS: ORAL_TABLET | ORAL | 0 refills | Status: DC

## 2020-12-07 MED ORDER — LEVOTHYROXINE SODIUM 150 MCG PO TABS: ORAL_TABLET | ORAL | 0 refills | Status: DC

## 2020-12-08 ENCOUNTER — Telehealth: Payer: Self-pay | Admitting: Pharmacist

## 2020-12-08 NOTE — Chronic Care Management (AMB) (Signed)
Chronic Care Management Pharmacy Assistant   Name: Mercedes Hodge  MRN: [id number] DOB: 1955-09-30  Reason for Encounter: Disease State   Conditions to be addressed/monitored: HLD  Recent office visits:  . 12.07.2021 Martinique, Betty G, MD Family Medicine . 02.07.2022 Medicare Wellness Visit  Recent consult visits:  . 01.06.2022 [facility] o Extracorporeal shock wave lithotripsy . 12.16.2021 [place], Amy S, PA-C Gastroenterology o Rifaxmin 550 mg oral three times a day . 11.08.2021 Esterwood, Amy S, PA-C Gastroenterology o Hyoscyamine Sulfate 0.125 MG Dissolve 1 tablet on the tongue every 4-6 hours and needed for abdominal cramping/diarrhea . 10.18.2021 Bo Merino, [place] [facility] visits:  None in previous 6 months  Medications: Outpatient Encounter Medications as of 12/08/2020  Medication Sig  . amLODipine-benazepril (LOTREL) 5-40 MG capsule Take 1 capsule by mouth daily.  . Cholecalciferol (VITAMIN D) 50 MCG (2000 UT) tablet Take 2,000 Units by mouth daily.  . diclofenac sodium (VOLTAREN) 1 % GEL Apply topically 4 (four) times daily.  Marland Kitchen escitalopram (LEXAPRO) 10 MG tablet Take 1 tablet (10 mg total) by mouth daily.  Marland Kitchen estradiol (ESTRACE) 0.1 MG/GM vaginal cream Place 1 Applicatorful vaginally 2 (two) times a week.  . fluticasone (FLONASE) 50 MCG/ACT nasal spray Place into both nostrils as needed for allergies or rhinitis.  . Fluticasone-Salmeterol 113-14 MCG/ACT AEPB Inhale 1 puff into the lungs 2 (two) times daily.  . hydrochlorothiazide (HYDRODIURIL) 12.5 MG tablet TAKE 1 TABLET EVERY DAY  . hydroxychloroquine (PLAQUENIL) 200 MG tablet Take 2 tablets (400 mg total) by mouth daily.  . hyoscyamine (LEVSIN SL) 0.125 MG SL tablet Dissolve 1 tablet on the tongue every 4-6 hours and needed for abdominal cramping/diarrhea  . levothyroxine (SYNTHROID) 150 MCG tablet 1 tab Monday,Wed,Friday,Sat,and Sun.  Marland Kitchen levothyroxine (SYNTHROID) 175 MCG  tablet 1 tab Tuesday and Thursdays.  . pravastatin (PRAVACHOL) 20 MG tablet TAKE 1 TABLET (20 MG TOTAL) BY MOUTH DAILY.   No facility-administered encounter medications on file as of 12/08/2020.   12/08/2020 Name: Mercedes Hodge MRN: [id number] DOB: 1956/08/08 Mercedes Hodge is a 65 y.o. year old female who is a primary care patient of Martinique, Malka So, MD.  Comprehensive medication review performed; Spoke to patient regarding cholesterol  Lipid Panel    Component Value Date/Time   CHOL 188 02/17/2020 0950   TRIG 74.0 02/17/2020 0950   HDL 73.20 02/17/2020 0950   LDLCALC 100 (H) 02/17/2020 0950    10-year ASCVD risk score: The 10-year ASCVD risk score Mikey Bussing DC Brooke Bonito., et al., 2013) is: 6.1%   Values used to calculate the score:     Age: 65 years     Sex: Female     Is Non-Hispanic African American: No     Diabetic: No     Tobacco smoker: No     Systolic Blood Pressure: 710 mmHg     Is BP treated: Yes     HDL Cholesterol: 73.2 mg/dL     Total Cholesterol: 188 mg/dL  . Current antihyperlipidemic regimen:  o Pravastatin 20 mg . Previous antihyperlipidemic medications tried: None . ASCVD risk enhancing conditions: HTN . What recent interventions/DTPs have been made by any provider to improve Cholesterol control since last CPP Visit: None . Any recent hospitalizations or ED visits since last visit with CPP? No . What diet changes have been made to improve Cholesterol?  o She is eating more greens and drinking water . What exercise is being done to improve Cholesterol?  o Increased some exercise  Adherence Review: Does the patient have >5 day gap between last estimated fill dates? No  I spoke with the patient and discussed medication adherence. There are no issues with the current medication. She recently followed up with her PCP.  She states that she has lost a few more pounds. She says she has increased some of her activities and is eating healthier. She denies any side  effects from her medication. She denies any emergency department visits since her last CPP or PCP follow-up. Also, she denies any problems with her current pharmacy.  Star Rating Drugs:  Dispensed Quantity Pharmacy  Pravastatin 20 mg 01.04.2022 [address] [place], [place] [phone]

## 2020-12-09 ENCOUNTER — Telehealth: Payer: Self-pay

## 2020-12-09 DIAGNOSIS — E890 Postprocedural hypothyroidism: Secondary | ICD-10-CM

## 2020-12-09 DIAGNOSIS — E785 Hyperlipidemia, unspecified: Secondary | ICD-10-CM

## 2020-12-09 DIAGNOSIS — F325 Major depressive disorder, single episode, in full remission: Secondary | ICD-10-CM

## 2020-12-09 MED ORDER — HYDROCHLOROTHIAZIDE 12.5 MG PO TABS: 12.5000 mg | ORAL_TABLET | Freq: Every day | ORAL | 2 refills | Status: DC

## 2020-12-09 MED ORDER — ESCITALOPRAM OXALATE 10 MG PO TABS: 10.0000 mg | ORAL_TABLET | Freq: Every day | ORAL | 3 refills | Status: DC

## 2020-12-09 MED ORDER — LEVOTHYROXINE SODIUM 150 MCG PO TABS: ORAL_TABLET | ORAL | 0 refills | Status: DC

## 2020-12-09 MED ORDER — FLUTICASONE-SALMETEROL 113-14 MCG/ACT IN AEPB: 1.0000 | INHALATION_SPRAY | Freq: Two times a day (BID) | RESPIRATORY_TRACT | 2 refills | Status: DC

## 2020-12-09 MED ORDER — PRAVASTATIN SODIUM 20 MG PO TABS: 20.0000 mg | ORAL_TABLET | Freq: Every day | ORAL | 2 refills | Status: DC

## 2020-12-09 MED ORDER — LEVOTHYROXINE SODIUM 175 MCG PO TABS: ORAL_TABLET | ORAL | 0 refills | Status: DC

## 2020-12-09 NOTE — Telephone Encounter (Signed)
-----   Message from Viona Gilmore, [facility] sent at 12/09/2020  4:33 PM EDT ----- Regarding: Refills Hi,  Ms. Clair switched to [facility] which I updated on her profile but I guess [facility] will not transfer her old prescriptions. Can you please send refills of her medications to [facility]? -Amlodipine-benazepril -escitalopram -levothyroxine (both prescriptions) -pravastatin -HCTZ -fluticasone/salmeterol inhaler  Thanks! Maddie

## 2020-12-15 ENCOUNTER — Ambulatory Visit: Payer: Medicare HMO | Admitting: Gastroenterology

## 2020-12-15 ENCOUNTER — Other Ambulatory Visit: Payer: Self-pay

## 2020-12-15 ENCOUNTER — Other Ambulatory Visit (INDEPENDENT_AMBULATORY_CARE_PROVIDER_SITE_OTHER): Payer: Medicare HMO

## 2020-12-15 ENCOUNTER — Encounter: Payer: Self-pay | Admitting: Gastroenterology

## 2020-12-15 VITALS — BP 104/74 | HR 83 | Ht 67.0 in | Wt 208.1 lb

## 2020-12-15 DIAGNOSIS — K625 Hemorrhage of anus and rectum: Secondary | ICD-10-CM | POA: Diagnosis not present

## 2020-12-15 LAB — HEMOGLOBIN: Hemoglobin: 13.6 g/dL (ref 12.0–15.0)

## 2020-12-15 LAB — HEMATOCRIT: HCT: 39.1 % (ref 36.0–46.0)

## 2020-12-15 MED ORDER — ONDANSETRON HCL 4 MG PO TABS: ORAL_TABLET | ORAL | 0 refills | Status: DC

## 2020-12-15 NOTE — Patient Instructions (Addendum)
You have been scheduled for a colonoscopy. Please follow written instructions given to you at your visit today.  Please pick up your prep supplies at the pharmacy within the next 1-3 days. If you use inhalers (even only as needed), please bring them with you on the day of your procedure. ( We have given you a Plenvu sample prep kit today)   Your provider has requested that you go to the basement level for lab work before leaving today. Press "B" on the elevator. The lab is located at the first door on the left as you exit the elevator.   If you are age 15 or older, your body mass index should be between 23-30. Your Body mass index is 32.6 kg/m. If this is out of the aforementioned range listed, please consider follow up with your Primary Care Provider.  If you are age 28 or younger, your body mass index should be between 19-25. Your Body mass index is 32.6 kg/m. If this is out of the aformentioned range listed, please consider follow up with your Primary Care Provider.    Due to recent changes in healthcare laws, you may see the results of your imaging and laboratory studies on MyChart before your provider has had a chance to review them.  We understand that in some cases there may be results that are confusing or concerning to you. Not all laboratory results come back in the same time frame and the provider may be waiting for multiple results in order to interpret others.  Please give [place] 48 hours in order for your provider to thoroughly review all the results before contacting the office for clarification of your results.   I appreciate the  opportunity to care for you  Thank You   Harl Bowie , MD

## 2020-12-15 NOTE — Progress Notes (Signed)
Mercedes Hodge    [id number]    1955/09/26  Primary Care Physician:[name], Malka So, MD  Referring Physician: Martinique, Betty G, MD [address] [place],  [place] [address]   Chief complaint:  Diarrhea, Rectal bleeding  HPI:  65 year old very pleasant female here for follow-up visit with complaints of rectal bleeding and intermittent diarrhea She started noticing rectal bleeding on and off for the past 4 months, she first noticed rectal bleeding in November 2021, she has had few episodes of clots but has mostly been bright red blood per rectum few times a week.  She has also noticed change in bowel habits with intermittent diarrhea. Denies any melena. She is s/p gastric sleeve in 2019 in [place].  Last colonoscopy 2017, has pancolonic diverticulosis and had colon polyps removed on previous exam.  She was recommended recall colonoscopy in 5 years. Other relevant medical history includes s/p cholecystectomy, hypertension, chronic GERD and rheumatoid arthritis.  She recently underwent lithotripsy for ureteral stone and urosepsis  Outpatient Encounter Medications as of 12/15/2020  Medication Sig  . amLODipine-benazepril (LOTREL) 5-40 MG capsule Take 1 capsule by mouth daily.  . Cholecalciferol (VITAMIN D) 50 MCG (2000 UT) tablet Take 2,000 Units by mouth daily.  . diclofenac sodium (VOLTAREN) 1 % GEL Apply topically 4 (four) times daily.  Marland Kitchen escitalopram (LEXAPRO) 10 MG tablet Take 1 tablet (10 mg total) by mouth daily.  Marland Kitchen estradiol (ESTRACE) 0.1 MG/GM vaginal cream Place 1 Applicatorful vaginally 2 (two) times a week.  . fluticasone (FLONASE) 50 MCG/ACT nasal spray Place into both nostrils as needed for allergies or rhinitis.  . Fluticasone-Salmeterol 113-14 MCG/ACT AEPB Inhale 1 puff into the lungs 2 (two) times daily.  . hydrochlorothiazide (HYDRODIURIL) 12.5 MG tablet Take 1 tablet (12.5 mg total) by mouth daily.  . hydroxychloroquine (PLAQUENIL) 200 MG tablet  Take 2 tablets (400 mg total) by mouth daily.  . hyoscyamine (LEVSIN SL) 0.125 MG SL tablet Dissolve 1 tablet on the tongue every 4-6 hours and needed for abdominal cramping/diarrhea  . levothyroxine (SYNTHROID) 150 MCG tablet 1 tab Monday,Wed,Friday,Sat,and Sun.  Marland Kitchen levothyroxine (SYNTHROID) 175 MCG tablet 1 tab Tuesday and Thursdays.  . pravastatin (PRAVACHOL) 20 MG tablet Take 1 tablet (20 mg total) by mouth daily.   No facility-administered encounter medications on file as of 12/15/2020.    Allergies as of 12/15/2020 - Review Complete 12/15/2020  Allergen Reaction Noted  . Lovenox [enoxaparin sodium] Rash 06/07/2019    Past Medical History:  Diagnosis Date  . Allergy   . Arthritis   . Asthma   . Cancer ([place])    thyroid  . GERD (gastroesophageal reflux disease)   . Hyperlipidemia   . Hypertension   . Thyroid disease    Postsurgical hypothyroidism    Past Surgical History:  Procedure Laterality Date  . ABDOMINAL HYSTERECTOMY  1993  . CHOLECYSTECTOMY  1982  . EXTRACORPOREAL SHOCK WAVE LITHOTRIPSY Left 09/17/2020   Procedure: EXTRACORPOREAL SHOCK WAVE LITHOTRIPSY (ESWL);  Surgeon: Franchot Gallo, MD;  Location: [facility];  Service: Urology;  Laterality: Left;  . LAPAROSCOPIC GASTRIC SLEEVE RESECTION  07/2018  . left knee replacement  2008  . right ankle replacement  2012  . right knee replacement  2009  . trimallar FX  1995   right ankle    Family History  Problem Relation Age of Onset  . COPD Mother   . Heart disease Mother   . Cancer  Father        thyroid cancer  . Juvenile Rhematoid Arthritis Sister   . Stroke Sister   . Thyroid cancer Brother   . Thyroid cancer Daughter   . Healthy Daughter   . Healthy Son   . Healthy Daughter   . Colon cancer Neg Hx   . Esophageal cancer Neg Hx   . Pancreatic cancer Neg Hx   . Stomach cancer Neg Hx   . Liver disease Neg Hx     Social History   Socioeconomic History  . Marital status: Married     Spouse name: Not on file  . Number of children: Not on file  . Years of education: Not on file  . Highest education level: Not on file  Occupational History  . Not on file  Tobacco Use  . Smoking status: Never Smoker  . Smokeless tobacco: Never Used  Vaping Use  . Vaping Use: Never used  Substance and Sexual Activity  . Alcohol use: Not Currently    Comment: occ wine   . Drug use: Never  . Sexual activity: Yes    Birth control/protection: None  Other Topics Concern  . Not on file  Social History Narrative  . Not on file   Social Determinants of Health   Financial Resource Strain: Low Risk   . Difficulty of Paying Living Expenses: Not hard at all  Food Insecurity: No Food Insecurity  . Worried About Charity fundraiser in the Last Year: Never true  . Ran Out of Food in the Last Year: Never true  Transportation Needs: No Transportation Needs  . Lack of Transportation (Medical): No  . Lack of Transportation (Non-Medical): No  Physical Activity: Insufficiently Active  . Days of Exercise per Week: 4 days  . Minutes of Exercise per Session: 20 min  Stress: No Stress Concern Present  . Feeling of Stress : Not at all  Social Connections: Moderately Integrated  . Frequency of Communication with Friends and Family: More than three times a week  . Frequency of Social Gatherings with Friends and Family: Three times a week  . Attends Religious Services: More than 4 times per year  . Active Member of Clubs or Organizations: No  . Attends Archivist Meetings: Never  . Marital Status: Married  Human resources officer Violence: Not At Risk  . Fear of Current or Ex-Partner: No  . Emotionally Abused: No  . Physically Abused: No  . Sexually Abused: No      Review of systems: All other review of systems negative except as mentioned in the HPI.   Physical Exam: Vitals:   12/15/20 1342  BP: 104/74  Pulse: 83   Body mass index is 32.6 kg/m. Gen:      No acute  distress HEENT:  sclera anicteric Abd:      soft, non-tender; no palpable masses, no distension Ext:    No edema Neuro: alert and oriented x 3 Psych: normal mood and affect  Data Reviewed:  Reviewed labs, radiology imaging, old records and pertinent past GI work up   Assessment and Plan/Recommendations:  64 year old very pleasant female with complaints of change in bowel habits, diarrhea and rectal bleeding Will schedule for colonoscopy for further evaluation.  Differential includes inflammatory bowel disease/ulcerative colitis, ischemic colitis, neoplastic lesion or rectal stercoral ulcer Further recommendation based on findings on colonoscopy Check hemoglobin and hematocrit The risks and benefits as well as alternatives of endoscopic procedure(s) have been discussed and reviewed.  All questions answered. The patient agrees to proceed.  The patient was provided an opportunity to ask questions and all were answered. The patient agreed with the plan and demonstrated an understanding of the instructions.  Damaris Hippo , MD    CC: Martinique, Betty G, MD

## 2020-12-16 ENCOUNTER — Encounter: Payer: Self-pay | Admitting: Gastroenterology

## 2020-12-17 ENCOUNTER — Encounter: Payer: Self-pay | Admitting: Gastroenterology

## 2020-12-17 ENCOUNTER — Other Ambulatory Visit: Payer: Self-pay

## 2020-12-17 ENCOUNTER — Ambulatory Visit (AMBULATORY_SURGERY_CENTER): Payer: Medicare HMO | Admitting: Gastroenterology

## 2020-12-17 VITALS — BP 119/58 | HR 57 | Temp 96.4°F | Resp 19 | Ht 67.0 in | Wt 208.0 lb

## 2020-12-17 DIAGNOSIS — K5730 Diverticulosis of large intestine without perforation or abscess without bleeding: Secondary | ICD-10-CM | POA: Diagnosis not present

## 2020-12-17 DIAGNOSIS — K648 Other hemorrhoids: Secondary | ICD-10-CM

## 2020-12-17 DIAGNOSIS — D122 Benign neoplasm of ascending colon: Secondary | ICD-10-CM

## 2020-12-17 DIAGNOSIS — K625 Hemorrhage of anus and rectum: Secondary | ICD-10-CM | POA: Diagnosis not present

## 2020-12-17 DIAGNOSIS — D123 Benign neoplasm of transverse colon: Secondary | ICD-10-CM

## 2020-12-17 MED ORDER — HYDROCORTISONE ACE-PRAMOXINE 1-1 % EX CREA: 1.0000 "application " | TOPICAL_CREAM | Freq: Two times a day (BID) | CUTANEOUS | 1 refills | Status: DC | PRN

## 2020-12-17 MED ORDER — SODIUM CHLORIDE 0.9 % IV SOLN: 500.0000 mL | Freq: Once | INTRAVENOUS | Status: DC

## 2020-12-17 NOTE — Op Note (Signed)
[facility] Patient Name: Sevana Grandinetti Procedure Date: 12/17/2020 9:14 AM MRN: [id number] Endoscopist: Mauri Pole , MD Age: 65 Referring MD:  Date of Birth: [birth year] Gender: Female Account #: [id number] Procedure:                Colonoscopy Indications:              Evaluation of unexplained GI bleeding presenting                            with Hematochezia, Personal history of colonic                            polyps Medicines:                Monitored Anesthesia Care Procedure:                Pre-Anesthesia Assessment:                           - Prior to the procedure, a History and Physical                            was performed, and patient medications and                            allergies were reviewed. The patient's tolerance of                            previous anesthesia was also reviewed. The risks                            and benefits of the procedure and the sedation                            options and risks were discussed with the patient.                            All questions were answered, and informed consent                            was obtained. Prior Anticoagulants: The patient has                            taken no previous anticoagulant or antiplatelet                            agents. ASA Grade Assessment: III - A patient with                            severe systemic disease. After reviewing the risks                            and benefits, the patient was deemed in  satisfactory condition to undergo the procedure.                           After obtaining informed consent, the colonoscope                            was passed under direct vision. Throughout the                            procedure, the patient's blood pressure, pulse, and                            oxygen saturations were monitored continuously. The                            Olympus PFC-H190DL [id number]) Colonoscope was                             introduced through the anus and advanced to the the                            cecum, identified by appendiceal orifice and                            ileocecal valve. The colonoscopy was performed                            without difficulty. The patient tolerated the                            procedure well. The quality of the bowel                            preparation was good. The ileocecal valve,                            appendiceal orifice, and rectum were photographed. Scope In: 9:22:26 AM Scope Out: 9:47:18 AM Scope Withdrawal Time: 0 hours 16 minutes 19 seconds  Total Procedure Duration: 0 hours 24 minutes 52 seconds  Findings:                 Hemorrhoids were found on perianal exam.                           A less than 1 mm polyp was found in the transverse                            colon. The polyp was sessile. The polyp was removed                            with a cold biopsy forceps. Resection and retrieval                            were complete.  Scattered small and large-mouthed diverticula were                            found in the sigmoid colon, descending colon,                            transverse colon, ascending colon and cecum.                            Peri-diverticular erythema was seen. There was                            evidence of an impacted diverticulum. There was no                            evidence of diverticular bleeding.                           A 15 mm polyp was found in the ascending colon. The                            polyp was semi-pedunculated. The polyp was removed                            with a hot snare. Resection and retrieval were                            complete.                           Non-bleeding external and internal hemorrhoids were                            found during retroflexion. The hemorrhoids were                            large.                            The exam was otherwise without abnormality. Complications:            No immediate complications. Estimated Blood Loss:     Estimated blood loss was minimal. Impression:               - Hemorrhoids found on perianal exam.                           - One less than 1 mm polyp in the transverse colon,                            removed with a cold biopsy forceps. Resected and                            retrieved.                           -  Severe diverticulosis in the sigmoid colon, in                            the descending colon, in the transverse colon, in                            the ascending colon and in the cecum.                            Peri-diverticular erythema was seen. There was                            evidence of an impacted diverticulum. There was no                            evidence of diverticular bleeding.                           - One 15 mm polyp in the ascending colon, removed                            with a hot snare. Resected and retrieved.                           - Non-bleeding external and internal hemorrhoids.                           - The examination was otherwise normal. Recommendation:           - Patient has a contact number available for                            emergencies. The signs and symptoms of potential                            delayed complications were discussed with the                            patient. Return to normal activities tomorrow.                            Written discharge instructions were provided to the                            patient.                           - Resume previous diet.                           - Continue present medications.                           - Await pathology results.                           -  Repeat colonoscopy in 3 years for surveillance                            based on pathology results.                           - Return to GI clinic at the next available                             appointment for hemorrhoidal band ligation. Please                            call to schedule appointment.                           - Use Benefiber two teaspoons PO TID.                           - Use Analpram HC Cream 2.5%: Apply externally BID                            PRN. Mauri Pole, MD 12/17/2020 9:56:24 AM This report has been signed electronically.

## 2020-12-17 NOTE — Progress Notes (Signed)
Called to room to assist during endoscopic procedure.  Patient ID and intended procedure confirmed with present staff. Received instructions for my participation in the procedure from the performing physician.  

## 2020-12-17 NOTE — Patient Instructions (Signed)
YOU HAD AN ENDOSCOPIC PROCEDURE TODAY AT THE [facility]:   Refer to the procedure report that was given to you for any specific questions about what was found during the examination.  If the procedure report does not answer your questions, please call your gastroenterologist to clarify.  If you requested that your care partner not be given the details of your procedure findings, then the procedure report has been included in a sealed envelope for you to review at your convenience later.  YOU SHOULD EXPECT: Some feelings of bloating in the abdomen. Passage of more gas than usual.  Walking can help get rid of the air that was put into your GI tract during the procedure and reduce the bloating. If you had a lower endoscopy (such as a colonoscopy or flexible sigmoidoscopy) you may notice spotting of blood in your stool or on the toilet paper. If you underwent a bowel prep for your procedure, you may not have a normal bowel movement for a few days.  Please Note:  You might notice some irritation and congestion in your nose or some drainage.  This is from the oxygen used during your procedure.  There is no need for concern and it should clear up in a day or so.  SYMPTOMS TO REPORT IMMEDIATELY:   Following lower endoscopy (colonoscopy or flexible sigmoidoscopy):  Excessive amounts of blood in the stool  Significant tenderness or worsening of abdominal pains  Swelling of the abdomen that is new, acute  Fever of 100F or higher  For urgent or emergent issues, a gastroenterologist can be reached at any hour by calling [phone]. Do not use MyChart messaging for urgent concerns.    DIET:  We do recommend a small meal at first, but then you may proceed to your regular diet.  Drink plenty of fluids but you should avoid alcoholic beverages for 24 hours.  ACTIVITY:  You should plan to take it easy for the rest of today and you should NOT DRIVE or use heavy machinery until tomorrow (because  of the sedation medicines used during the test).    FOLLOW UP: Our staff will call the number listed on your records 48-72 hours following your procedure to check on you and address any questions or concerns that you may have regarding the information given to you following your procedure. If we do not reach you, we will leave a message.  We will attempt to reach you two times.  During this call, we will ask if you have developed any symptoms of COVID 19. If you develop any symptoms (ie: fever, flu-like symptoms, shortness of breath, cough etc.) before then, please call [phone].  If you test positive for Covid 19 in the 2 weeks post procedure, please call and report this information to us.    If any biopsies were taken you will be contacted by phone or by letter within the next 1-3 weeks.  Please call us at [phone] if you have not heard about the biopsies in 3 weeks.    SIGNATURES/CONFIDENTIALITY: You and/or your care partner have signed paperwork which will be entered into your electronic medical record.  These signatures attest to the fact that that the information above on your After Visit Summary has been reviewed and is understood.  Full responsibility of the confidentiality of this discharge information lies with you and/or your care-partner. 

## 2020-12-17 NOTE — Progress Notes (Signed)
To PACU, VSS. Report to [email or web address] 

## 2020-12-21 ENCOUNTER — Telehealth: Payer: Self-pay | Admitting: Gastroenterology

## 2020-12-21 ENCOUNTER — Telehealth: Payer: Self-pay | Admitting: *Deleted

## 2020-12-21 NOTE — Telephone Encounter (Signed)
Inbound call from patient requesting a sooner appt for hemorrhoid banding.  States her bleeding has gotten worse.  Please advise.

## 2020-12-21 NOTE — Telephone Encounter (Signed)
  Follow up Call-  Call back number 12/17/2020  Post procedure Call Back phone  # [phone]  Permission to leave phone message Yes  Some recent data might be hidden     Patient questions:  Do you have a fever, pain , or abdominal swelling? No. Pain Score  0 *  Have you tolerated food without any problems? Yes.    Have you been able to return to your normal activities? Yes.    Do you have any questions about your discharge instructions: Diet   No. Medications  No. Follow up visit  No.  Do you have questions or concerns about your Care? No.  Actions: * If pain score is 4 or above: 1. No action needed, pain <4.Have you developed a fever since your procedure? no  2.   Have you had an respiratory symptoms (SOB or cough) since your procedure? no  3.   Have you tested positive for COVID 19 since your procedure no  4.   Have you had any family members/close contacts diagnosed with the COVID 19 since your procedure?  no   If yes to any of these questions please route to Joylene John, RN and Joella Prince, RN

## 2020-12-21 NOTE — Telephone Encounter (Signed)
Left message for patient to call back  

## 2020-12-21 NOTE — Telephone Encounter (Signed)
I left a detailed message I have added her to the wait list for Dr. Silverio Decamp.  Unfortunately no earlier appointments at this time.

## 2020-12-29 ENCOUNTER — Telehealth: Payer: Self-pay | Admitting: Pharmacist

## 2020-12-29 NOTE — Chronic Care Management (AMB) (Signed)
Chronic Care Management Pharmacy Assistant   Name: Mercedes Hodge  MRN: [id number] DOB: Apr 09, 1956  Reason for Encounter: Disease State   Conditions to be addressed/monitored: HTN  Recent office visits:  None  Recent consult visits:  . 04.05.2022 Mauri Pole, MD Gastroenterology o Medications prescribed o Ondansetron HCl 4 MG Take 1 tablet 30 minutes before the start of your colon prep the night before and 1 tablet the day of your prep 30 Minutes before the start of second dose... the extra two tablets use as needed  Hospital visits:  None in previous 6 months  Medications: Outpatient Encounter Medications as of 12/29/2020  Medication Sig  . amLODipine-benazepril (LOTREL) 5-40 MG capsule Take 1 capsule by mouth daily.  . Cholecalciferol (VITAMIN D) 50 MCG (2000 UT) tablet Take 2,000 Units by mouth daily.  . diclofenac sodium (VOLTAREN) 1 % GEL Apply topically 4 (four) times daily.  Marland Kitchen escitalopram (LEXAPRO) 10 MG tablet Take 1 tablet (10 mg total) by mouth daily.  Marland Kitchen estradiol (ESTRACE) 0.1 MG/GM vaginal cream Place 1 Applicatorful vaginally 2 (two) times a week.  . fluticasone (FLONASE) 50 MCG/ACT nasal spray Place into both nostrils as needed for allergies or rhinitis. (Patient not taking: Reported on 12/17/2020)  . Fluticasone-Salmeterol 113-14 MCG/ACT AEPB Inhale 1 puff into the lungs 2 (two) times daily. (Patient not taking: Reported on 12/17/2020)  . hydrochlorothiazide (HYDRODIURIL) 12.5 MG tablet Take 1 tablet (12.5 mg total) by mouth daily.  . hydroxychloroquine (PLAQUENIL) 200 MG tablet Take 2 tablets (400 mg total) by mouth daily.  . hyoscyamine (LEVSIN SL) 0.125 MG SL tablet Dissolve 1 tablet on the tongue every 4-6 hours and needed for abdominal cramping/diarrhea (Patient not taking: Reported on 12/17/2020)  . levothyroxine (SYNTHROID) 150 MCG tablet 1 tab Monday,Wed,Friday,Sat,and Sun.  Marland Kitchen levothyroxine (SYNTHROID) 175 MCG tablet 1 tab Tuesday and Thursdays.  .  ondansetron (ZOFRAN) 4 MG tablet Take 1 tablet 30 minutes before the start of your colon prep the night before and 1 tablet the day of your prep 30  Minutes before the start of second dose... the extra two tablets use as needed  . pramoxine-hydrocortisone (PROCTOCREAM-HC) 1-1 % rectal cream Place 1 application rectally 2 (two) times daily as needed for hemorrhoids or anal itching.  . pravastatin (PRAVACHOL) 20 MG tablet Take 1 tablet (20 mg total) by mouth daily.   No facility-administered encounter medications on file as of 12/29/2020.    Reviewed chart prior to disease state call. Spoke with patient regarding BP  Recent Office Vitals: BP Readings from Last 3 Encounters:  12/17/20 (!) 119/58  12/15/20 104/74  11/27/20 110/74   Pulse Readings from Last 3 Encounters:  12/17/20 (!) 57  12/15/20 83  11/27/20 76    Wt Readings from Last 3 Encounters:  12/17/20 208 lb (94.3 kg)  12/15/20 208 lb 2 oz (94.4 kg)  11/27/20 208 lb 3.2 oz (94.4 kg)     Kidney Function Lab Results  Component Value Date/Time   CREATININE 0.77 11/27/2020 10:10 AM   CREATININE 0.74 06/29/2020 10:19 AM   GFRNONAA 80 11/27/2020 10:10 AM   GFRAA 93 11/27/2020 10:10 AM    BMP Latest Ref Rng & Units 11/27/2020 06/29/2020 01/27/2020  Glucose 65 - 99 mg/dL 86 85 86  BUN 7 - 25 mg/dL 15 15 17   Creatinine 0.50 - 0.99 mg/dL 0.77 0.74 0.88  BUN/Creat Ratio 6 - 22 (calc) NOT APPLICABLE NOT APPLICABLE NOT APPLICABLE  Sodium 782 - 146 mmol/L  138 137 139  Potassium 3.5 - 5.3 mmol/L 4.0 3.6 4.1  Chloride 98 - 110 mmol/L 101 102 100  CO2 20 - 32 mmol/L 27 28 28   Calcium 8.6 - 10.4 mg/dL 9.0 9.3 9.5   . Current antihypertensive regimen:  o Amlodipine-benazepril 5-40 mg daily  o HCTZ 12.5 mg daily  . How often are you checking your Blood Pressure? several times per month . Current home BP readings: o 04.02 110/72 o 04.08 110/74 o 04.13 106/70 o 04.15 110/74 . What recent interventions/DTPs have been made by any  provider to improve Blood Pressure control since last CPP Visit: None . Any recent hospitalizations or ED visits since last visit with CPP? No . What diet changes have been made to improve Blood Pressure Control?  o She continues to drink more water . What exercise is being done to improve your Blood Pressure Control?  o Increase her time of walking by 15 mins  Adherence Review: Is the patient currently on ACE/ARB medication? No Does the patient have >5 day gap between last estimated fill dates? No  I spoke with the patient and discussed medication inherence. She has no current issues with her prescriptions. She states that she continues to walk and improve on her diet. There have been no changes to her medications. She does not have any side effects from the medications that she is taking. There have been no emergency room visits or urgent care visits since our last CPP or PCP visit. She stated that some of her medication is not covered by her new insurance. She was going to contact her primary care doctor to see if they can find a medication that is on the formulary. She's having no other concerns with her pharmacy currently.   Star Rating Drugs:  Dispensed Quantity Pharmacy  Amlodipine-benazepril 02.19.2022 [address] [place]) [place], [place] [phone]

## 2021-01-01 ENCOUNTER — Encounter: Payer: Self-pay | Admitting: Family Medicine

## 2021-01-01 ENCOUNTER — Other Ambulatory Visit: Payer: Self-pay | Admitting: Family Medicine

## 2021-01-01 MED ORDER — BUDESONIDE-FORMOTEROL FUMARATE 160-4.5 MCG/ACT IN AERO: 2.0000 | INHALATION_SPRAY | Freq: Two times a day (BID) | RESPIRATORY_TRACT | 3 refills | Status: DC

## 2021-01-01 NOTE — Telephone Encounter (Signed)
I'm having pt contact her insurance again - I looked up the formulary and they don't seem to cover anything close to the inhaler she is prescribed. Is there something else it can be changed too?

## 2021-01-01 NOTE — Telephone Encounter (Signed)
Insurance will cover Advair or Symbicort, which one do you want to switch too?

## 2021-01-05 ENCOUNTER — Ambulatory Visit: Payer: Medicare HMO | Admitting: Gastroenterology

## 2021-01-06 ENCOUNTER — Encounter: Payer: Self-pay | Admitting: Gastroenterology

## 2021-01-13 DIAGNOSIS — Z79899 Other long term (current) drug therapy: Secondary | ICD-10-CM | POA: Diagnosis not present

## 2021-01-13 DIAGNOSIS — H1045 Other chronic allergic conjunctivitis: Secondary | ICD-10-CM | POA: Diagnosis not present

## 2021-01-13 DIAGNOSIS — H2513 Age-related nuclear cataract, bilateral: Secondary | ICD-10-CM | POA: Diagnosis not present

## 2021-01-13 DIAGNOSIS — H43811 Vitreous degeneration, right eye: Secondary | ICD-10-CM | POA: Diagnosis not present

## 2021-01-13 DIAGNOSIS — H04123 Dry eye syndrome of bilateral lacrimal glands: Secondary | ICD-10-CM | POA: Diagnosis not present

## 2021-01-13 DIAGNOSIS — M069 Rheumatoid arthritis, unspecified: Secondary | ICD-10-CM | POA: Diagnosis not present

## 2021-02-01 ENCOUNTER — Encounter: Payer: Self-pay | Admitting: Gastroenterology

## 2021-02-01 ENCOUNTER — Ambulatory Visit: Payer: Medicare HMO | Admitting: Gastroenterology

## 2021-02-01 VITALS — BP 110/70 | HR 76 | Ht 65.5 in | Wt 208.0 lb

## 2021-02-01 DIAGNOSIS — K641 Second degree hemorrhoids: Secondary | ICD-10-CM | POA: Diagnosis not present

## 2021-02-01 NOTE — Progress Notes (Signed)
PROCEDURE NOTE: The patient presents with symptomatic grade II  hemorrhoids, requesting rubber band ligation of his/her hemorrhoidal disease.  All risks, benefits and alternative forms of therapy were described and informed consent was obtained.  In the Left Lateral Decubitus position anoscopic examination revealed grade II hemorrhoids in the right posterior, right anterior and left lateral position(s).  The anorectum was pre-medicated with 0.125% NTG and Recticare The decision was made to band the Right posterior internal hemorrhoid, and the [place] was used to perform band ligation without complication.  Digital anorectal examination was then performed to assure proper positioning of the band, and to adjust the banded tissue as required.  The patient was discharged home without pain or other issues.  Dietary and behavioral recommendations were given and along with follow-up instructions.      The patient will return in 2-4 weeks for  follow-up and possible additional banding as required. No complications were encountered and the patient tolerated the procedure well.  Damaris Hippo , MD [phone]

## 2021-02-01 NOTE — Patient Instructions (Signed)

## 2021-02-10 ENCOUNTER — Encounter: Payer: Self-pay | Admitting: Family Medicine

## 2021-02-10 MED ORDER — BUDESONIDE-FORMOTEROL FUMARATE 160-4.5 MCG/ACT IN AERO: 2.0000 | INHALATION_SPRAY | Freq: Two times a day (BID) | RESPIRATORY_TRACT | 3 refills | Status: DC

## 2021-02-24 ENCOUNTER — Other Ambulatory Visit: Payer: Self-pay | Admitting: Family Medicine

## 2021-02-24 DIAGNOSIS — E890 Postprocedural hypothyroidism: Secondary | ICD-10-CM

## 2021-02-26 MED ORDER — LEVOTHYROXINE SODIUM 150 MCG PO TABS: ORAL_TABLET | ORAL | 1 refills | Status: DC

## 2021-04-02 ENCOUNTER — Ambulatory Visit: Payer: Medicare HMO | Admitting: Gastroenterology

## 2021-04-02 ENCOUNTER — Encounter: Payer: Self-pay | Admitting: Gastroenterology

## 2021-04-02 VITALS — BP 112/66 | HR 70 | Ht 67.0 in | Wt 207.0 lb

## 2021-04-02 DIAGNOSIS — K641 Second degree hemorrhoids: Secondary | ICD-10-CM | POA: Diagnosis not present

## 2021-04-02 NOTE — Progress Notes (Signed)
PROCEDURE NOTE: The patient presents with symptomatic grade II hemorrhoids, requesting rubber band ligation of his/her hemorrhoidal disease.  All risks, benefits and alternative forms of therapy were described and informed consent was obtained.   The anorectum was pre-medicated with 0.125% NTG and Recti care The decision was made to band the left lateral internal hemorrhoid, and the [place] was used to perform band ligation without complication.  Digital anorectal examination was then performed to assure proper positioning of the band, and to adjust the banded tissue as required.  The patient was discharged home without pain or other issues.  Dietary and behavioral recommendations were given and along with follow-up instructions.      The patient will return in 2-4 weeks for  follow-up and possible additional banding as required. No complications were encountered and the patient tolerated the procedure well.  Damaris Hippo , MD [phone]

## 2021-04-02 NOTE — Patient Instructions (Signed)

## 2021-04-15 NOTE — Progress Notes (Addendum)
Office Visit Note  Patient: Mercedes Hodge             Date of Birth: 02/27/1956           MRN: [id number]             PCP: Martinique, Betty G, MD Referring: Martinique, Betty G, MD Visit Date: 04/29/2021 Occupation: '@GUAROCC'$ @  Subjective Medication monitoring.   History of Present Illness: Mercedes Hodge is a 65 y.o. female with history of rheumatoid arthritis and osteoarthritis.  She states she has been doing well on hydroxychloroquine.  She denies any joint swelling.  She has intermittent issues with the right middle trigger finger.  She states she has been doing a lot of crafts which has been good for her hands.  She also started going to water aerobics 3 times a week which has been helpful.  Her bilateral knee joint replacements are doing well.  Her right ankle joint replacement and states swollen.  She has off-and-on discomfort in the trochanteric bursa.  Activities of Daily Living:  Patient reports morning stiffness for 15-20 minutes.   Patient Denies nocturnal pain.  Difficulty dressing/grooming: Denies Difficulty climbing stairs: Denies Difficulty getting out of chair: Denies Difficulty using hands for taps, buttons, cutlery, and/or writing: Denies  Review of Systems  Constitutional:  Negative for fatigue.  HENT:  Negative for mouth sores, mouth dryness and nose dryness.   Eyes:  Negative for pain, itching and dryness.  Respiratory:  Negative for shortness of breath and difficulty breathing.   Cardiovascular:  Negative for chest pain and palpitations.  Gastrointestinal:  Negative for blood in stool, constipation and diarrhea.  Endocrine: Negative for increased urination.  Genitourinary:  Negative for difficulty urinating.  Musculoskeletal:  Positive for joint pain, joint pain, joint swelling and morning stiffness. Negative for myalgias, muscle tenderness and myalgias.  Skin:  Negative for color change, rash and redness.  Allergic/Immunologic: Negative for susceptible  to infections.  Neurological:  Negative for dizziness, numbness, headaches, memory loss and weakness.  Hematological:  Negative for bruising/bleeding tendency.  Psychiatric/Behavioral:  Negative for confusion.    PMFS History:  Patient Active Problem List   Diagnosis Date Noted   Aortic atherosclerosis ([place]) 08/18/2020   History of colonic polyps 07/20/2020   Depression, major, single episode, mild ([place]) 02/17/2020   Atrophic vaginitis 02/17/2020   Rheumatoid arthritis of multiple sites with negative rheumatoid factor ([place]) 07/02/2019   High risk medication use 07/02/2019   Primary osteoarthritis of both hands 07/02/2019   Status post total bilateral knee replacement 07/02/2019   Status post right ankle joint replacement 07/02/2019   Essential hypertension 07/02/2019   History of asthma 07/02/2019   History of thyroid cancer 07/02/2019   Postoperative hypothyroidism 07/02/2019   Dyslipidemia 07/02/2019   Gastroesophageal reflux disease without esophagitis 07/02/2019   Osteopenia of multiple sites 07/02/2019    Past Medical History:  Diagnosis Date   Allergy    Arthritis    Asthma    GERD (gastroesophageal reflux disease)    Hyperlipidemia    Hypertension    Thyroid cancer ([place])    thyroid   Thyroid disease    Postsurgical hypothyroidism    Family History  Problem Relation Age of Onset   COPD Mother    Heart disease Mother    Cancer Father        thyroid cancer   Juvenile Rhematoid Arthritis Sister    Stroke Sister    Thyroid cancer Brother  Thyroid cancer Daughter    Healthy Daughter    Healthy Son    Healthy Daughter    Colon cancer Neg Hx    Esophageal cancer Neg Hx    Pancreatic cancer Neg Hx    Stomach cancer Neg Hx    Liver disease Neg Hx    Past Surgical History:  Procedure Laterality Date   ABDOMINAL HYSTERECTOMY  1993   CHOLECYSTECTOMY  1982   EXTRACORPOREAL SHOCK WAVE LITHOTRIPSY Left 09/17/2020   Procedure: EXTRACORPOREAL SHOCK WAVE LITHOTRIPSY  (ESWL);  Surgeon: Franchot Gallo, MD;  [facility];  Service: Urology;  Laterality: Left;   LAPAROSCOPIC GASTRIC SLEEVE RESECTION  07/2018   left knee replacement  2008   right ankle replacement  2012   right knee replacement  2009   trimallar FX  1995   right ankle   Social History   Social History Narrative   Not on file   Immunization History  Administered Date(s) Administered   Influenza-Unspecified 05/22/2019, 06/10/2020   PFIZER(Purple Top)SARS-COV-2 Vaccination 11/17/2019, 12/08/2019, 06/10/2020   Pneumococcal Conjugate-13 06/07/2019   Pneumococcal Polysaccharide-23 08/18/2020   Tdap 06/07/2019     Objective: Vital Signs: BP 121/78 (BP Location: Left Arm, Patient Position: Sitting, Cuff Size: Normal)   Pulse 69   Ht '5\' 8"'$  (1.727 m)   Wt 210 lb 12.8 oz (95.6 kg)   BMI 32.05 kg/m    Physical Exam Vitals and nursing note reviewed.  Constitutional:      Appearance: She is well-developed.  HENT:     Head: Normocephalic and atraumatic.  Eyes:     Conjunctiva/sclera: Conjunctivae normal.  Cardiovascular:     Rate and Rhythm: Normal rate and regular rhythm.     Heart sounds: Normal heart sounds.  Pulmonary:     Effort: Pulmonary effort is normal.     Breath sounds: Normal breath sounds.  Abdominal:     General: Bowel sounds are normal.     Palpations: Abdomen is soft.  Musculoskeletal:     Cervical back: Normal range of motion.  Lymphadenopathy:     Cervical: No cervical adenopathy.  Skin:    General: Skin is warm and dry.     Capillary Refill: Capillary refill takes less than 2 seconds.  Neurological:     Mental Status: She is alert and oriented to person, place, and time.  Psychiatric:        Behavior: Behavior normal.     Musculoskeletal Exam: C-spine was in good range of motion.  Shoulder joints, elbow joints, wrist joints, MCPs PIPs and DIPs with good range of motion with no synovitis.  She had bilateral PIP and DIP  thickening.  Hip joints in good range of motion with mild tenderness over trochanteric area.  Bilateral knee joints are replaced without any discomfort.  She had thickening of her right ankle joint from previous fracture limited to range of motion.  CDAI Exam: CDAI Score: -- Patient Global: --; Provider Global: -- Swollen: --; Tender: -- Joint Exam 04/29/2021   No joint exam has been documented for this visit   There is currently no information documented on the homunculus. Go to the Rheumatology activity and complete the homunculus joint exam.  Investigation: No additional findings.  Imaging: No results found.  Recent Labs: Lab Results  Component Value Date   WBC 7.3 11/27/2020   HGB 13.6 12/15/2020   PLT 252 11/27/2020   NA 138 11/27/2020   K 4.0 11/27/2020   CL 101 11/27/2020  CO2 27 11/27/2020   GLUCOSE 86 11/27/2020   BUN 15 11/27/2020   CREATININE 0.77 11/27/2020   BILITOT 0.4 11/27/2020   AST 19 11/27/2020   ALT 15 11/27/2020   PROT 6.6 11/27/2020   CALCIUM 9.0 11/27/2020   GFRAA 93 11/27/2020    Speciality Comments: Plaquenil eye exam: 01/13/2021  WNL Follow up in 1 year  Procedures:  No procedures performed Allergies: Lovenox [enoxaparin sodium]   Assessment / Plan:     Visit Diagnoses: Rheumatoid arthritis of multiple sites with negative rheumatoid factor (HCC) - Positive anti-CCP (repeat negative) and elevated CRP, Diagnosed in [place] by her rheumatologist. Hx of inflammatory arthritis: She is clinically doing well without any joint pain or joint swelling.  High risk medication use - Plaquenil 200 mg 1 tablet by mouth twice daily.  Plaquenil eye exam: 01/13/2021.  Labs on November 27, 2020 were normal.  We will check labs again today.  She plans to get COVID-19 booster next week.  Information regarding other immunization was also placed in the AVS.  Primary osteoarthritis of both hands-joint protection muscle strengthening was discussed.  Trigger finger,  right middle finger-she has intermittent issues.  Trochanteric bursitis of both hips -she has off-and-on discomfort.  Stretching exercises were emphasized.  Status post total bilateral knee replacement-doing well.  Status post right ankle joint replacement - History of Charcot joint which required total ankle replacement.   Osteopenia of multiple sites - DEXA from May 2021 which showed a T score of -1.1.  Use of calcium rich diet, vitamin D and resistive exercises were discussed.  Essential hypertension-blood pressure is normal today.  History of asthma  History of thyroid cancer  Postoperative hypothyroidism  Gastroesophageal reflux disease without esophagitis  Dyslipidemia-increased risk of heart disease with rheumatoid arthritis was discussed.  Dietary modifications and exercise was emphasized.  She has been exercising on a regular basis.  Orders: No orders of the defined types were placed in this encounter.  No orders of the defined types were placed in this encounter.  Follow-Up Instructions: Return in about 5 months (around 09/29/2021) for Rheumatoid arthritis.   Bo Merino, MD  Note - This record has been created using Editor, commissioning.  Chart creation errors have been sought, but may not always  have been located. Such creation errors do not reflect on  the standard of medical care.

## 2021-04-16 ENCOUNTER — Telehealth: Payer: Self-pay | Admitting: Pharmacist

## 2021-04-16 NOTE — Chronic Care Management (AMB) (Signed)
Chronic Care Management Pharmacy Assistant   Name: Mercedes Hodge  MRN: [id number] DOB: 11/02/1955    Reason for Encounter: Disease State Hypertension Assessment Call   Conditions to be addressed/monitored: HTN  Recent office visits:  None  Recent consult visits:  01-13-2021 Clent Jacks (Ophthalmology) - Patient presented for Eye exam and other issues. No medication changes noted  4-5-2022Nandigam, Venia Minks, MD [facility]) - Patient presented for rectal bleeding. Prescribed Zofran '4mg'$  PRN    Hospital visits:  None in previous 6 months  Medications: Outpatient Encounter Medications as of 04/16/2021  Medication Sig   amLODipine-benazepril (LOTREL) 5-40 MG capsule Take 1 capsule by mouth daily.   budesonide-formoterol (SYMBICORT) 160-4.5 MCG/ACT inhaler Inhale 2 puffs into the lungs 2 (two) times daily.   Cholecalciferol (VITAMIN D) 50 MCG (2000 UT) tablet Take 2,000 Units by mouth daily.   diclofenac sodium (VOLTAREN) 1 % GEL Apply topically 4 (four) times daily.   escitalopram (LEXAPRO) 10 MG tablet Take 1 tablet (10 mg total) by mouth daily.   estradiol (ESTRACE) 0.1 MG/GM vaginal cream Place 1 Applicatorful vaginally 2 (two) times a week.   fluticasone (FLONASE) 50 MCG/ACT nasal spray Place into both nostrils as needed for allergies or rhinitis.   hydrochlorothiazide (HYDRODIURIL) 12.5 MG tablet Take 1 tablet (12.5 mg total) by mouth daily.   hydroxychloroquine (PLAQUENIL) 200 MG tablet Take 2 tablets (400 mg total) by mouth daily.   hyoscyamine (LEVSIN SL) 0.125 MG SL tablet Dissolve 1 tablet on the tongue every 4-6 hours and needed for abdominal cramping/diarrhea   levothyroxine (SYNTHROID) 150 MCG tablet 1 tab Monday,Wed,Friday,Sat,and Sun.   levothyroxine (SYNTHROID) 175 MCG tablet 1 tab Tuesday and Thursdays.   ondansetron (ZOFRAN) 4 MG tablet Take 1 tablet 30 minutes before the start of your colon prep the night before and 1 tablet the day of your prep 30  Minutes  before the start of second dose... the extra two tablets use as needed   pramoxine-hydrocortisone (PROCTOCREAM-HC) 1-1 % rectal cream Place 1 application rectally 2 (two) times daily as needed for hemorrhoids or anal itching.   pravastatin (PRAVACHOL) 20 MG tablet Take 1 tablet (20 mg total) by mouth daily.   No facility-administered encounter medications on file as of 04/16/2021.   Reviewed chart prior to disease state call. Spoke with patient regarding BP  Recent Office Vitals: BP Readings from Last 3 Encounters:  04/02/21 112/66  02/01/21 110/70  12/17/20 (!) 119/58   Pulse Readings from Last 3 Encounters:  04/02/21 70  02/01/21 76  12/17/20 (!) 57    Wt Readings from Last 3 Encounters:  04/02/21 207 lb (93.9 kg)  02/01/21 208 lb (94.3 kg)  12/17/20 208 lb (94.3 kg)     Kidney Function Lab Results  Component Value Date/Time   CREATININE 0.77 11/27/2020 10:10 AM   CREATININE 0.74 06/29/2020 10:19 AM   GFRNONAA 80 11/27/2020 10:10 AM   GFRAA 93 11/27/2020 10:10 AM    BMP Latest Ref Rng & Units 11/27/2020 06/29/2020 01/27/2020  Glucose 65 - 99 mg/dL 86 85 86  BUN 7 - 25 mg/dL '15 15 17  '$ Creatinine 0.50 - 0.99 mg/dL 0.77 0.74 0.88  BUN/Creat Ratio 6 - 22 (calc) NOT APPLICABLE NOT APPLICABLE NOT APPLICABLE  Sodium [id number] - 146 mmol/L 138 137 139  Potassium 3.5 - 5.3 mmol/L 4.0 3.6 4.1  Chloride 98 - 110 mmol/L 101 102 100  CO2 20 - 32 mmol/L '27 28 28  '$ Calcium 8.6 - 10.4  mg/dL 9.0 9.3 9.5    Current antihypertensive regimen:  Amlodipine-benazepril 5-40 mg daily HCTZ 12.5 mg daily  How often are you checking your Blood Pressure? infrequently Patient reports she does not check as often as she should checks every 3 months or so. Reminded her to try and check on a weekly basis at least. Current home BP readings: 115/68 most recent prior 120/70 and 110/70 What recent interventions/DTPs have been made by any provider to improve Blood Pressure control since last CPP Visit: Patient  reports none Any recent hospitalizations or ED visits since last visit with CPP? No What diet changes have been made to improve Blood Pressure Control?  Patient reports for breakfast she will have usually an egg. For lunches lately she has been having tomato sandwiches as they are in season. For dinners she will usually have a meat and a vegetable.  What exercise is being done to improve your Blood Pressure Control?  Patient reports she and her husband recently joined the [facility] and are taking water aerobics and exercise classes 3 times a week  Adherence Review: Is the patient currently on ACE/ARB medication? Yes Does the patient have >5 day gap between last estimated fill dates? No  She reports no issues or concerns at this time she wanted to know when she last saw PCP advised her it was in Feb for AWV, she noted she will call the office and get herself on the books just for routine purposes.  Care Gaps:  COVID Booster #4 Veterinary surgeon ) - Overdue Flu Vaccine - Overdue AWV - Done 10-19-2020 CCM F/U call - Scheduled 06-22-21 10 am  Star Rating Drugs:  Pravastatin '20mg'$  - Last filled 02-22-2021 90 DS at [facility] mailorder Amlodipine - Benazapril 5-'40mg'$  - Last filled 10-31-2020 90 DS at [facility] to verify pravastatin fill date spoke to Dana at [facility] Pharmacist Assistant [phone]

## 2021-04-18 IMAGING — MG MM DIGITAL SCREENING BILAT W/ TOMO AND CAD
8 series · 8 of 24 positions shown · non-contrast
Comparison: Previous exam(s).

CLINICAL DATA: Screening.

EXAM:
DIGITAL SCREENING BILATERAL MAMMOGRAM WITH TOMO AND CAD

[L CC synth-2D]
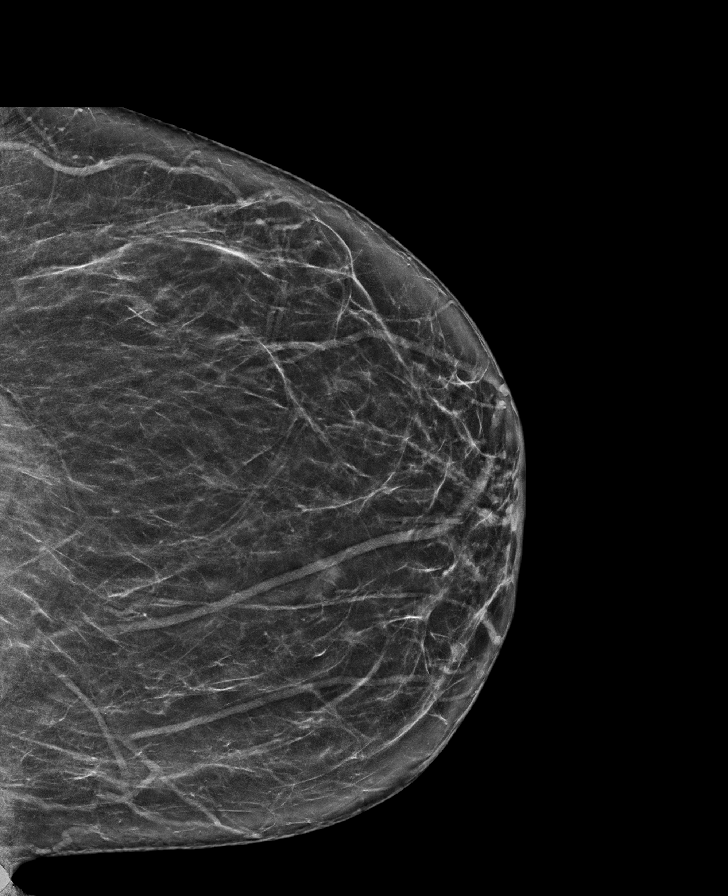

[R MLO synth-2D]
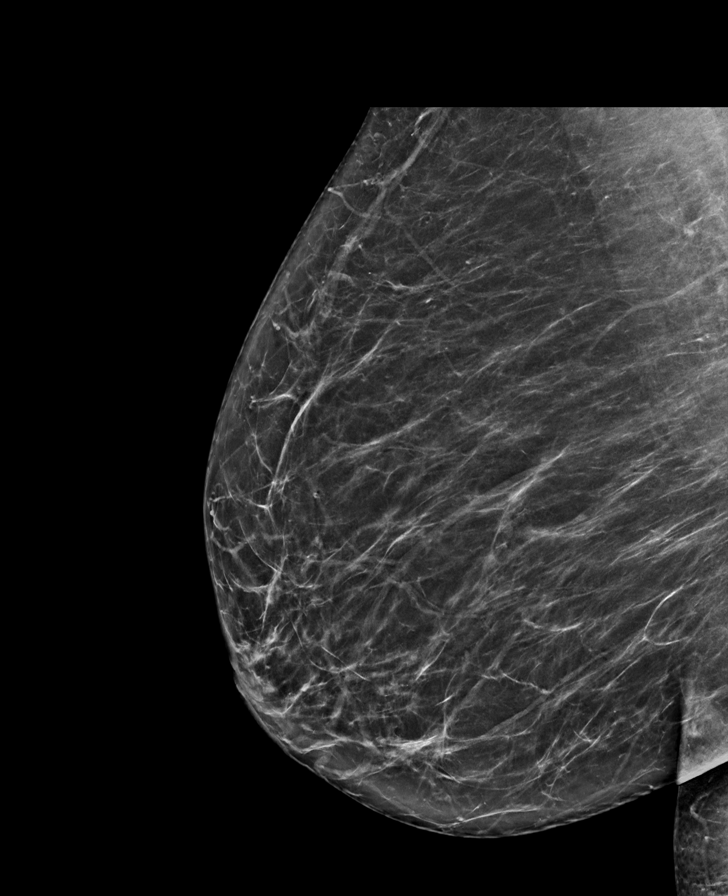

[R CC synth-2D]
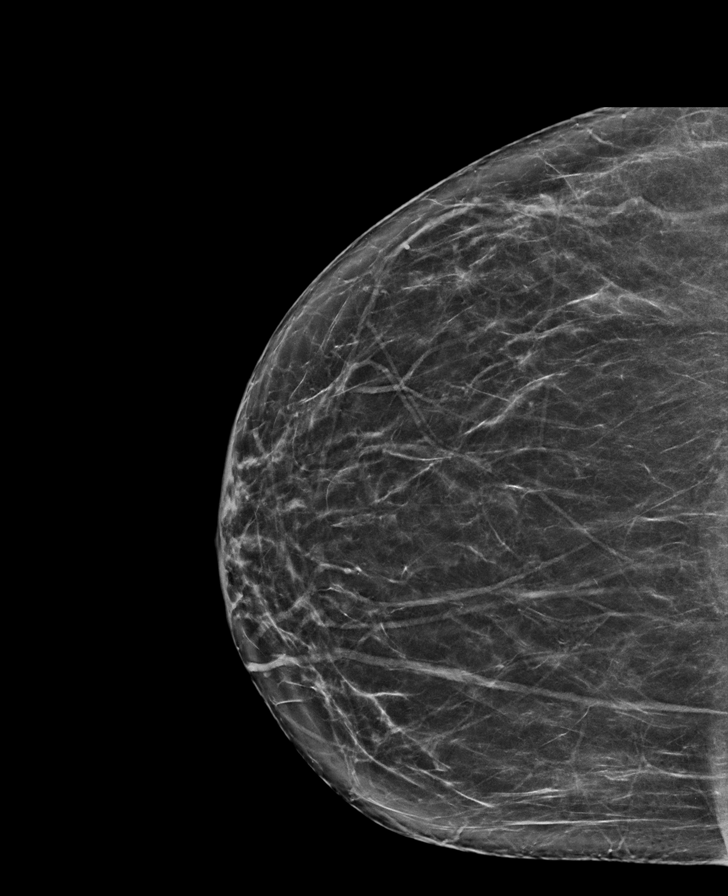

[L MLO synth-2D]
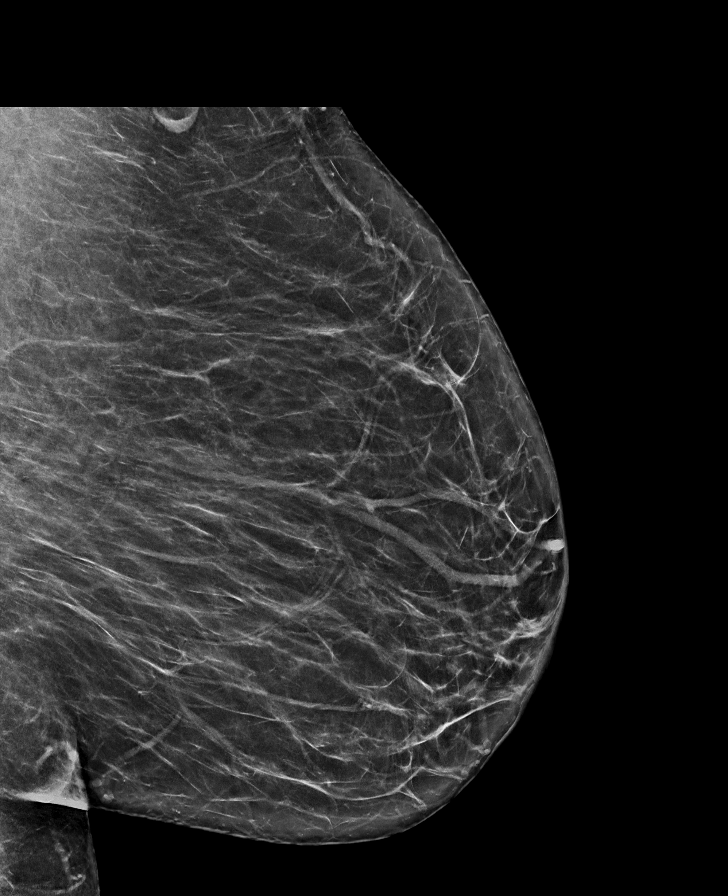

[R CC tomo · tomo slice 37/72.0]
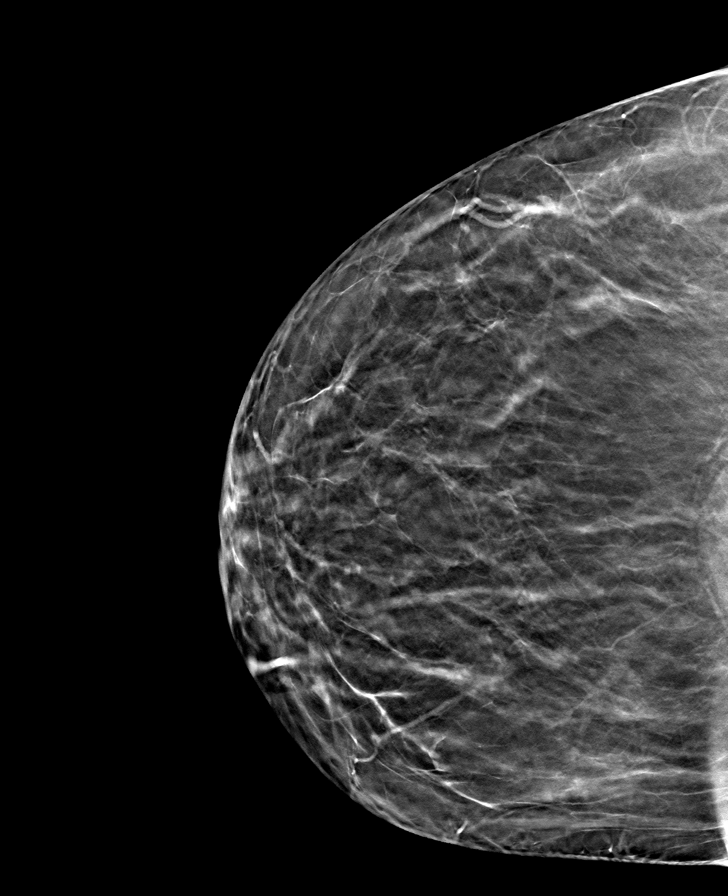

[R MLO tomo · tomo slice 39/76.0]
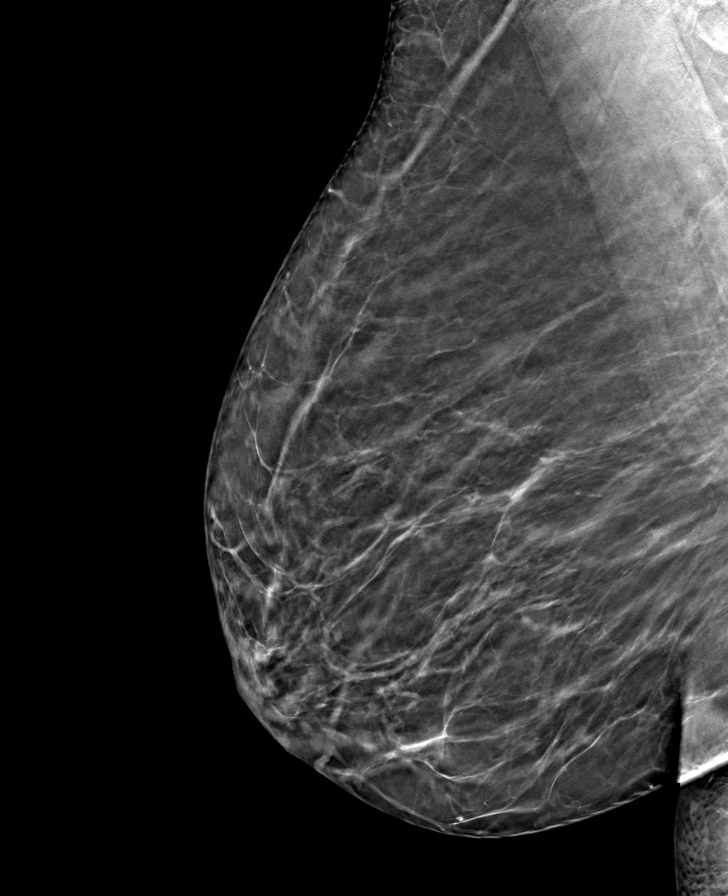

[L MLO tomo · tomo slice 38/75.0]
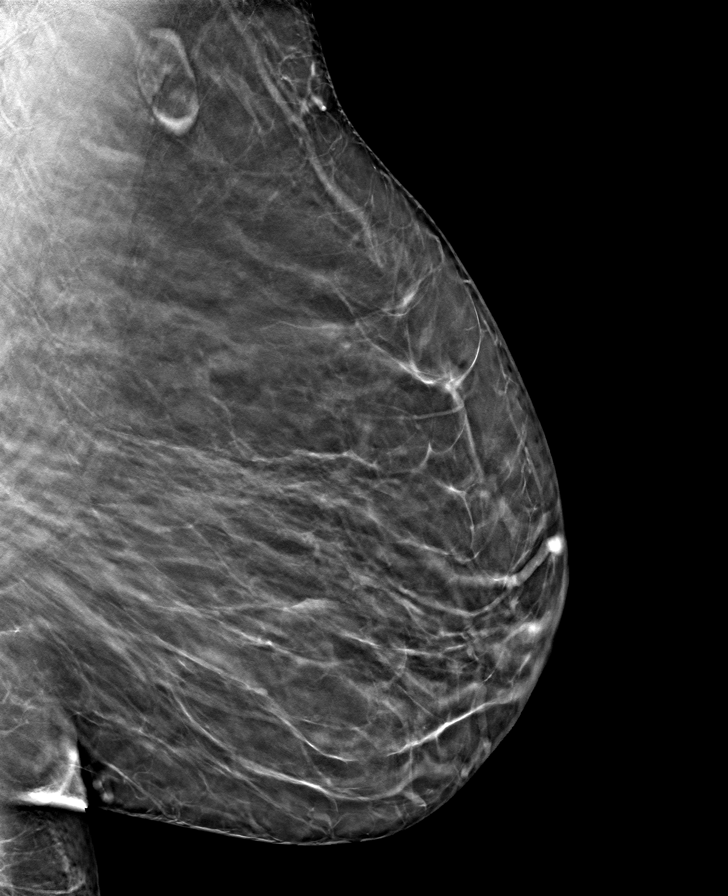

[L CC tomo · tomo slice 35/69.0]
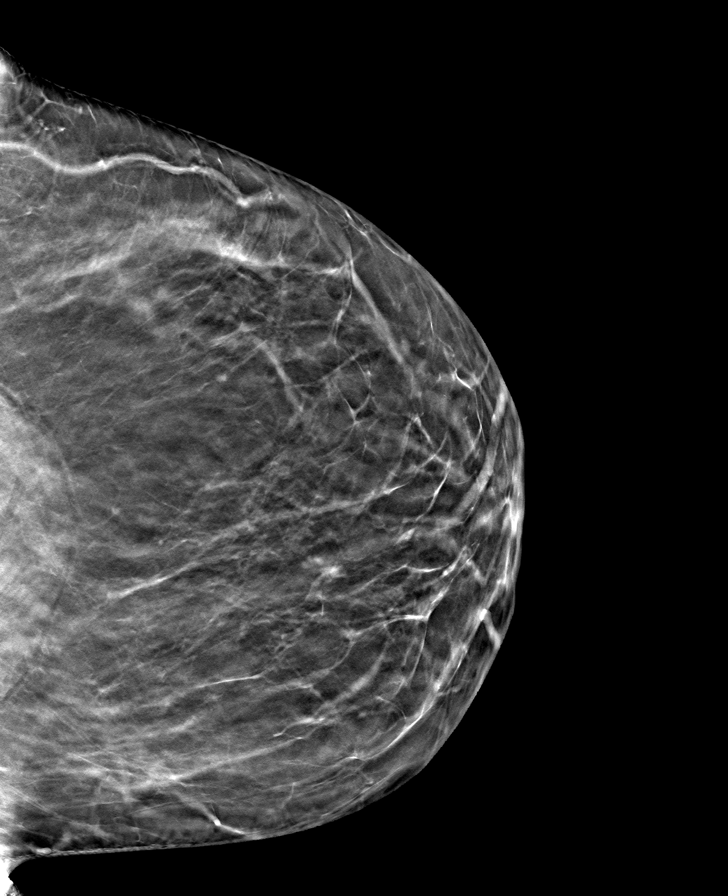

[8 of 24 positions shown; findings below may reference images not displayed]

ACR Breast Density Category b: There are scattered areas of
fibroglandular density.
FINDINGS: There are no findings suspicious for malignancy. Images were
processed with CAD.
IMPRESSION: No mammographic evidence of malignancy. A result letter of this
screening mammogram will be mailed directly to the patient.

RECOMMENDATION:
Screening mammogram in one year. (Code:CN-[id number])

BI-RADS CATEGORY  1: Negative.

## 2021-04-29 ENCOUNTER — Other Ambulatory Visit: Payer: Self-pay

## 2021-04-29 ENCOUNTER — Encounter: Payer: Self-pay | Admitting: Rheumatology

## 2021-04-29 ENCOUNTER — Ambulatory Visit: Payer: Medicare HMO | Admitting: Gastroenterology

## 2021-04-29 ENCOUNTER — Ambulatory Visit: Payer: Medicare HMO | Admitting: Rheumatology

## 2021-04-29 ENCOUNTER — Encounter: Payer: Self-pay | Admitting: Gastroenterology

## 2021-04-29 VITALS — BP 112/60 | HR 71 | Ht 68.0 in | Wt 209.0 lb

## 2021-04-29 VITALS — BP 121/78 | HR 69 | Ht 68.0 in | Wt 210.8 lb

## 2021-04-29 DIAGNOSIS — Z79899 Other long term (current) drug therapy: Secondary | ICD-10-CM

## 2021-04-29 DIAGNOSIS — E785 Hyperlipidemia, unspecified: Secondary | ICD-10-CM

## 2021-04-29 DIAGNOSIS — M7062 Trochanteric bursitis, left hip: Secondary | ICD-10-CM

## 2021-04-29 DIAGNOSIS — K219 Gastro-esophageal reflux disease without esophagitis: Secondary | ICD-10-CM

## 2021-04-29 DIAGNOSIS — Z96661 Presence of right artificial ankle joint: Secondary | ICD-10-CM

## 2021-04-29 DIAGNOSIS — M0609 Rheumatoid arthritis without rheumatoid factor, multiple sites: Secondary | ICD-10-CM

## 2021-04-29 DIAGNOSIS — Z8709 Personal history of other diseases of the respiratory system: Secondary | ICD-10-CM | POA: Diagnosis not present

## 2021-04-29 DIAGNOSIS — M7061 Trochanteric bursitis, right hip: Secondary | ICD-10-CM

## 2021-04-29 DIAGNOSIS — M65331 Trigger finger, right middle finger: Secondary | ICD-10-CM | POA: Diagnosis not present

## 2021-04-29 DIAGNOSIS — M19041 Primary osteoarthritis, right hand: Secondary | ICD-10-CM | POA: Diagnosis not present

## 2021-04-29 DIAGNOSIS — Z96653 Presence of artificial knee joint, bilateral: Secondary | ICD-10-CM | POA: Diagnosis not present

## 2021-04-29 DIAGNOSIS — M8589 Other specified disorders of bone density and structure, multiple sites: Secondary | ICD-10-CM

## 2021-04-29 DIAGNOSIS — E890 Postprocedural hypothyroidism: Secondary | ICD-10-CM

## 2021-04-29 DIAGNOSIS — Z85850 Personal history of malignant neoplasm of thyroid: Secondary | ICD-10-CM

## 2021-04-29 DIAGNOSIS — I10 Essential (primary) hypertension: Secondary | ICD-10-CM | POA: Diagnosis not present

## 2021-04-29 DIAGNOSIS — M19042 Primary osteoarthritis, left hand: Secondary | ICD-10-CM

## 2021-04-29 DIAGNOSIS — K641 Second degree hemorrhoids: Secondary | ICD-10-CM | POA: Diagnosis not present

## 2021-04-29 NOTE — Patient Instructions (Addendum)
HEMORRHOID BANDING PROCEDURE    FOLLOW-UP CARE   The procedure you have had should have been relatively painless since the banding of the area involved does not have nerve endings and there is no pain sensation.  The rubber band cuts off the blood supply to the hemorrhoid and the band may fall off as soon as 48 hours after the banding (the band may occasionally be seen in the toilet bowl following a bowel movement). You may notice a temporary feeling of fullness in the rectum which should respond adequately to plain Tylenol or Motrin.  Following the banding, avoid strenuous exercise that evening and resume full activity the next day.  A sitz bath (soaking in a warm tub) or bidet is soothing, and can be useful for cleansing the area after bowel movements.     To avoid constipation, take two tablespoons of natural wheat bran, natural oat bran, flax, Benefiber or any over the counter fiber supplement and increase your water intake to 7-8 glasses daily.    Unless you have been prescribed anorectal medication, do not put anything inside your rectum for two weeks: No suppositories, enemas, fingers, etc.  Occasionally, you may have more bleeding than usual after the banding procedure.  This is often from the untreated hemorrhoids rather than the treated one.  Don't be concerned if there is a tablespoon or so of blood.  If there is more blood than this, lie flat with your bottom higher than your head and apply an ice pack to the area. If the bleeding does not stop within a half an hour or if you feel faint, call our office at [phone] or go to the emergency room.  Problems are not common; however, if there is a substantial amount of bleeding, severe pain, chills, fever or difficulty passing urine (very rare) or other problems, you should call us at [phone] or report to the nearest emergency room.  Do not stay seated continuously for more than 2-3 hours for a day or two after the procedure.   Tighten your buttock muscles 10-15 times every two hours and take 10-15 deep breaths every 1-2 hours.  Do not spend more than a few minutes on the toilet if you cannot empty your bowel; instead re-visit the toilet at a later time.     Recall colonoscopy 12/12/2023

## 2021-04-29 NOTE — Patient Instructions (Signed)
Vaccines You are taking a medication(s) that can suppress your immune system.  The following immunizations are recommended: Flu annually Covid-19  Td/Tdap (tetanus, diphtheria, pertussis) every 10 years Pneumonia (Prevnar 15 then Pneumovax 23 at least 1 year apart.  Alternatively, can take Prevnar 20 without needing additional dose) Shingrix (after age 67): 2 doses from 4 weeks to 6 months apart  Please check with your PCP to make sure you are up to date.   Heart Disease Prevention   Your inflammatory disease increases your risk of heart disease which includes heart attack, stroke, atrial fibrillation (irregular heartbeats), high blood pressure, heart failure and atherosclerosis (plaque in the arteries).  It is important to reduce your risk by:   Keep blood pressure, cholesterol, and blood sugar at healthy levels   Smoking Cessation   Maintain a healthy weight  BMI 20-25   Eat a healthy diet  Plenty of fresh fruit, vegetables, and whole grains  Limit saturated fats, foods high in sodium, and added sugars  DASH and Mediterranean diet   Increase physical activity  Recommend moderate physically activity for 150 minutes per week/ 30 minutes a day for five days a week These can be broken up into three separate ten-minute sessions during the day.   Reduce Stress  Meditation, slow breathing exercises, yoga, coloring books  Dental visits twice a year

## 2021-04-29 NOTE — Progress Notes (Signed)
PROCEDURE NOTE: The patient presents with symptomatic grade II  hemorrhoids, requesting rubber band ligation of his/her hemorrhoidal disease.  All risks, benefits and alternative forms of therapy were described and informed consent was obtained.   The anorectum was pre-medicated with 0.125% NTG and Recticare The decision was made to band the right anterior internal hemorrhoid, and the [place] was used to perform band ligation without complication.  Digital anorectal examination was then performed to assure proper positioning of the band, and to adjust the banded tissue as required.  The patient was discharged home without pain or other issues.  Dietary and behavioral recommendations were given and along with follow-up instructions.      The patient will return for  follow-up as needed No complications were encountered and the patient tolerated the procedure well.  Damaris Hippo , MD [phone]

## 2021-04-30 LAB — CBC WITH DIFFERENTIAL/PLATELET
Absolute Monocytes: 743 cells/uL (ref 200–950)
Basophils Absolute: 50 cells/uL (ref 0–200)
Basophils Relative: 0.8 %
Eosinophils Absolute: 239 cells/uL (ref 15–500)
Eosinophils Relative: 3.8 %
HCT: 42.9 % (ref 35.0–45.0)
Hemoglobin: 14.7 g/dL (ref 11.7–15.5)
Lymphs Abs: 1777 cells/uL (ref 850–3900)
MCH: 32.8 pg (ref 27.0–33.0)
MCHC: 34.3 g/dL (ref 32.0–36.0)
MCV: 95.8 fL (ref 80.0–100.0)
MPV: 10 fL (ref 7.5–12.5)
Monocytes Relative: 11.8 %
Neutro Abs: 3490 cells/uL (ref 1500–7800)
Neutrophils Relative %: 55.4 %
Platelets: 203 10*3/uL (ref 140–400)
RBC: 4.48 10*6/uL (ref 3.80–5.10)
RDW: 12.7 % (ref 11.0–15.0)
Total Lymphocyte: 28.2 %
WBC: 6.3 10*3/uL (ref 3.8–10.8)

## 2021-04-30 LAB — COMPLETE METABOLIC PANEL WITH GFR
AG Ratio: 1.6 (calc) (ref 1.0–2.5)
ALT: 17 U/L (ref 6–29)
AST: 24 U/L (ref 10–35)
Albumin: 4.2 g/dL (ref 3.6–5.1)
Alkaline phosphatase (APISO): 55 U/L (ref 37–153)
BUN: 20 mg/dL (ref 7–25)
CO2: 30 mmol/L (ref 20–32)
Calcium: 9.7 mg/dL (ref 8.6–10.4)
Chloride: 101 mmol/L (ref 98–110)
Creat: 0.87 mg/dL (ref 0.50–1.05)
Globulin: 2.6 g/dL (calc) (ref 1.9–3.7)
Glucose, Bld: 88 mg/dL (ref 65–99)
Potassium: 4.2 mmol/L (ref 3.5–5.3)
Sodium: 138 mmol/L (ref 135–146)
Total Bilirubin: 0.7 mg/dL (ref 0.2–1.2)
Total Protein: 6.8 g/dL (ref 6.1–8.1)
eGFR: 73 mL/min/{1.73_m2} (ref >=60)

## 2021-05-18 ENCOUNTER — Other Ambulatory Visit: Payer: Self-pay | Admitting: *Deleted

## 2021-05-18 MED ORDER — HYDROXYCHLOROQUINE SULFATE 200 MG PO TABS: 200.0000 mg | ORAL_TABLET | Freq: Two times a day (BID) | ORAL | 0 refills | Status: DC

## 2021-05-18 NOTE — Telephone Encounter (Signed)
Refill request received via fax  Next Visit: 09/29/2021  Last Visit: 04/29/2021  Labs: 04/29/2021 CBC and CMP WNL  Eye exam: 01/13/2021  WNL    Current Dose per office note 04/29/2021: Plaquenil 200 mg 1 tablet by mouth twice daily.  [id number] arthritis of multiple sites with negative rheumatoid factor   Last Fill: 11/27/2020  Okay to refill Plaquenil?

## 2021-05-19 ENCOUNTER — Other Ambulatory Visit: Payer: Self-pay

## 2021-05-19 DIAGNOSIS — E785 Hyperlipidemia, unspecified: Secondary | ICD-10-CM

## 2021-05-19 DIAGNOSIS — E890 Postprocedural hypothyroidism: Secondary | ICD-10-CM

## 2021-05-19 DIAGNOSIS — I10 Essential (primary) hypertension: Secondary | ICD-10-CM

## 2021-05-19 DIAGNOSIS — F325 Major depressive disorder, single episode, in full remission: Secondary | ICD-10-CM

## 2021-05-19 MED ORDER — PRAVASTATIN SODIUM 20 MG PO TABS: 20.0000 mg | ORAL_TABLET | Freq: Every day | ORAL | 0 refills | Status: DC

## 2021-05-19 MED ORDER — AMLODIPINE BESY-BENAZEPRIL HCL 5-40 MG PO CAPS: 1.0000 | ORAL_CAPSULE | Freq: Every day | ORAL | 0 refills | Status: DC

## 2021-05-19 MED ORDER — BUDESONIDE-FORMOTEROL FUMARATE 160-4.5 MCG/ACT IN AERO: 2.0000 | INHALATION_SPRAY | Freq: Two times a day (BID) | RESPIRATORY_TRACT | 3 refills | Status: DC

## 2021-05-19 MED ORDER — LEVOTHYROXINE SODIUM 150 MCG PO TABS: ORAL_TABLET | ORAL | 0 refills | Status: DC

## 2021-05-19 MED ORDER — ESCITALOPRAM OXALATE 10 MG PO TABS: 10.0000 mg | ORAL_TABLET | Freq: Every day | ORAL | 0 refills | Status: DC

## 2021-05-19 MED ORDER — HYDROCHLOROTHIAZIDE 12.5 MG PO TABS: 12.5000 mg | ORAL_TABLET | Freq: Every day | ORAL | 0 refills | Status: DC

## 2021-05-19 MED ORDER — LEVOTHYROXINE SODIUM 175 MCG PO TABS: ORAL_TABLET | ORAL | 0 refills | Status: DC

## 2021-06-22 ENCOUNTER — Telehealth: Payer: Self-pay | Admitting: Pharmacist

## 2021-06-22 ENCOUNTER — Telehealth: Payer: Medicare HMO

## 2021-06-22 NOTE — Chronic Care Management (AMB) (Addendum)
Chronic Care Management Pharmacy Assistant   Name: Mercedes Hodge  MRN: [id number] DOB: [birth year]   Reason for Encounter: Disease State Hypertension Assessment Call   Conditions to be addressed/monitored: HTN  Primary concerns for visit include: Per Mercedes Hodge Follow up CCM visit for today 06/21/21 cancelled and assessment call opened, will reschedule CCM visit also.  Recent office visits:  None  Recent consult visits:  04/29/2021 - Mercedes Merino, MD (Rheumatology) - Patient presented for Rheumatoid arthritis of multiple sites with negative rheumatoid factor and other concerns.No medication changes.  Hospital visits:  None in previous 6 months  Medications: Outpatient Encounter Medications as of 06/22/2021  Medication Sig   amLODipine-benazepril (LOTREL) 5-40 MG capsule Take 1 capsule by mouth daily.   budesonide-formoterol (SYMBICORT) 160-4.5 MCG/ACT inhaler Inhale 2 puffs into the lungs 2 (two) times daily.   Cholecalciferol (VITAMIN D) 50 MCG (2000 UT) tablet Take 2,000 Units by mouth daily.   diclofenac sodium (VOLTAREN) 1 % GEL Apply topically 4 (four) times daily.   escitalopram (LEXAPRO) 10 MG tablet Take 1 tablet (10 mg total) by mouth daily.   estradiol (ESTRACE) 0.1 MG/GM vaginal cream Place 1 Applicatorful vaginally 2 (two) times a week.   fluticasone (FLONASE) 50 MCG/ACT nasal spray Place into both nostrils as needed for allergies or rhinitis.   hydrochlorothiazide (HYDRODIURIL) 12.5 MG tablet Take 1 tablet (12.5 mg total) by mouth daily.   hydroxychloroquine (PLAQUENIL) 200 MG tablet Take 1 tablet (200 mg total) by mouth 2 (two) times daily.   hyoscyamine (LEVSIN SL) 0.125 MG SL tablet Dissolve 1 tablet on the tongue every 4-6 hours and needed for abdominal cramping/diarrhea   levothyroxine (SYNTHROID) 150 MCG tablet 1 tab Monday,Wed,Friday,Sat,and Sun.   levothyroxine (SYNTHROID) 175 MCG tablet 1 tab Tuesday and Thursdays.   ondansetron (ZOFRAN) 4  MG tablet Take 1 tablet 30 minutes before the start of your colon prep the night before and 1 tablet the day of your prep 30  Minutes before the start of second dose... the extra two tablets use as needed (Patient taking differently: Take 1 tablet 30 minutes before the start of your colon prep the night before and 1 tablet the day of your prep 30  Minutes before the start of second dose... the extra two tablets use as needed)   pramoxine-hydrocortisone (PROCTOCREAM-HC) 1-1 % rectal cream Place 1 application rectally 2 (two) times daily as needed for hemorrhoids or anal itching.   pravastatin (PRAVACHOL) 20 MG tablet Take 1 tablet (20 mg total) by mouth daily.   No facility-administered encounter medications on file as of 06/22/2021.  Reviewed chart prior to disease state call. Spoke with patient regarding BP  Recent Office Vitals: BP Readings from Last 3 Encounters:  04/29/21 112/60  04/29/21 121/78  04/02/21 112/66   Pulse Readings from Last 3 Encounters:  04/29/21 71  04/29/21 69  04/02/21 70    Wt Readings from Last 3 Encounters:  04/29/21 209 lb (94.8 kg)  04/29/21 210 lb 12.8 oz (95.6 kg)  04/02/21 207 lb (93.9 kg)     Kidney Function Lab Results  Component Value Date/Time   CREATININE 0.87 04/29/2021 09:57 AM   CREATININE 0.77 11/27/2020 10:10 AM   GFRNONAA 80 11/27/2020 10:10 AM   GFRAA 93 11/27/2020 10:10 AM    BMP Latest Ref Rng & Units 04/29/2021 11/27/2020 06/29/2020  Glucose 65 - 99 mg/dL 88 86 85  BUN 7 - 25 mg/dL 20 15 15   Creatinine 0.50 - 1.05  mg/dL 0.87 0.77 0.74  BUN/Creat Ratio 6 - 22 (calc) NOT APPLICABLE NOT APPLICABLE NOT APPLICABLE  Sodium 373 - 146 mmol/L 138 138 137  Potassium 3.5 - 5.3 mmol/L 4.2 4.0 3.6  Chloride 98 - 110 mmol/L 101 101 102  CO2 20 - 32 mmol/L 30 27 28   Calcium 8.6 - 10.4 mg/dL 9.7 9.0 9.3    Current antihypertensive regimen:  Amlodipine-benazepril 5-40 mg daily HCTZ 12.5 mg daily  How often are you checking your Blood  Pressure? Patient reports she is checking her pressure about once a month Current home BP readings: This month was 108/64 she reports the top number has gotten as low as 95 she reports she is normally around 120 on the top. She reports she is feeling dizzy at times and is usually in the mornings, she reports she comes around and starts feeling better after a bit but wanted to know if her dose should be changed. What recent interventions/DTPs have been made by any provider to improve Blood Pressure control since last CPP Visit: Patient reports no changes Any recent hospitalizations or ED visits since last visit with CPP? Patient reports none What diet changes have been made to improve Blood Pressure Control?  Patient reports her meal patterns are still generally good, egg for breakfasts and for lunch / dinner she will eat leftovers of meat and vegetable prepared at home or grilled from her garden. What exercise is being done to improve your Blood Pressure Control?  Patient reports she is still doing her water aerobics classes 2 x a week and also added another water walking class that she states helps her mobility and joints.  Adherence Review: Is the patient currently on ACE/ARB medication? Yes Does the patient have >5 day gap between last estimated fill dates? Yes  Care Gaps: COVID Booster #4 Therapist, music) - Overdue Flu Vaccine - Overdue AWV - 10/19/20 CCM-09/28/21 BP- 127/78  Star Rating Drugs: Pravastatin 20mg  - [facility]242022 [facility]  Amlodipine - Benazapril 5-40mg  - Last filled 10-31-2020 90 [facility]  Call to [facility] numbers on patients chart and was unable to verify fill history dates.. mail order number was not in service, [facility] had no record [facility]. Call to [phone] ([facility] spoke to Mercedes Hodge she states there was no record of this medication on patients file for any recent fills. Patient reports she is filling this medication with [facility] and has  medication now.  06/25/21 Per Mercedes Hodge call to patient to advise her to bring her cuff and readings to upcoming appointment with Dr Mercedes Hodge so it can be checked for accuracy and that she also mention her dizziness/lightheadedness during the appointment as well. Left Voice mail.  [place] Clinical Pharmacist Assistant [phone]

## 2021-06-29 ENCOUNTER — Other Ambulatory Visit: Payer: Self-pay

## 2021-06-29 ENCOUNTER — Encounter: Payer: Self-pay | Admitting: Family Medicine

## 2021-06-29 ENCOUNTER — Ambulatory Visit (INDEPENDENT_AMBULATORY_CARE_PROVIDER_SITE_OTHER): Payer: Medicare Other | Admitting: Family Medicine

## 2021-06-29 VITALS — BP 120/70 | HR 75 | Resp 16 | Ht 68.0 in | Wt 209.0 lb

## 2021-06-29 DIAGNOSIS — Z23 Encounter for immunization: Secondary | ICD-10-CM | POA: Diagnosis not present

## 2021-06-29 DIAGNOSIS — E785 Hyperlipidemia, unspecified: Secondary | ICD-10-CM | POA: Diagnosis not present

## 2021-06-29 DIAGNOSIS — I10 Essential (primary) hypertension: Secondary | ICD-10-CM

## 2021-06-29 DIAGNOSIS — I700 Atherosclerosis of aorta: Secondary | ICD-10-CM

## 2021-06-29 DIAGNOSIS — E890 Postprocedural hypothyroidism: Secondary | ICD-10-CM | POA: Diagnosis not present

## 2021-06-29 DIAGNOSIS — F325 Major depressive disorder, single episode, in full remission: Secondary | ICD-10-CM | POA: Insufficient documentation

## 2021-06-29 LAB — LIPID PANEL
Cholesterol: 174 mg/dL (ref 0–200)
HDL: 79.4 mg/dL (ref >39.00)
LDL Cholesterol: 78 mg/dL (ref 0–99)
NonHDL: 94.77
Total CHOL/HDL Ratio: 2
Triglycerides: 85 mg/dL (ref 0.0–149.0)
VLDL: 17 mg/dL (ref 0.0–40.0)

## 2021-06-29 LAB — TSH: TSH: 0.09 u[IU]/mL — ABNORMAL LOW (ref 0.35–5.50)

## 2021-06-29 LAB — T4, FREE: Free T4: 1.27 ng/dL (ref 0.60–1.60)

## 2021-06-29 NOTE — Progress Notes (Signed)
HPI: Mercedes Hodge is a 65 y.o. female with hx of RA,GERD,HLD,HTN,HLD,and thyroid disease who is here today for follow up.   She was last seen on 08/18/20. No new problems since her last visit. She was seen GI, underwent rubber band hemorrhoids treatment. She has also seen her rheumatologist, Dr Kenn File.  Hypertension: Dx'ed in 2002. Medications:HCTZ 12.5 mg daily and Amlodipine-Benazepril  5-40 mg daily. BP readings at home:110-120/70 Side effects:None. Negative for unusual or severe headache, visual changes, exertional chest pain, dyspnea, palpitations, focal weakness, or edema.  Lab Results  Component Value Date   CREATININE 0.87 04/29/2021   BUN 20 04/29/2021   NA 138 04/29/2021   K 4.2 04/29/2021   CL 101 04/29/2021   CO2 30 04/29/2021   Hyperlipidemia: Currently on Pravastatin 20 mg daily. Following a low fat diet: Yes. Side effects from medication:None. Aortic atherosclerosis seen in abdominal CT in 07/2020.  Lab Results  Component Value Date   CHOL 188 02/17/2020   HDL 73.20 02/17/2020   LDLCALC 100 (H) 02/17/2020   TRIG 74.0 02/17/2020   CHOLHDL 3 02/17/2020   3 water classes and OA classes, exercising 6 times per week.  Hypothyroidism: Papillary thyroid cancer s/p total thyroidectomy  She is on Synthroid and alternates between 175 mcg and 150 mcg. Tolerating medication well, no side effects reported. She has not noted dysphagia, palpitations, abdominal pain, changes in bowel habits, tremor, cold/heat intolerance, or abnormal weight loss.  Thyroid US 09/02/20: Status post total thyroidectomy without evidence for residual thyroid tissue or pathologically enlarged cervical lymph node.  Lab Results  Component Value Date   TSH 0.05 (L) 11/27/2020   Depression in remission. Dx'ed 10-11 years ago. Denies depressed mood. She is on Lexapro 10 mg daily.  Review of Systems  Constitutional:  Negative for activity change and appetite change.   HENT:  Negative for mouth sores and nosebleeds.   Respiratory:  Negative for cough and wheezing.   Genitourinary:  Negative for decreased urine volume and hematuria.  Neurological:  Negative for syncope, facial asymmetry and weakness.  Psychiatric/Behavioral:  Negative for confusion. The patient is not nervous/anxious.   Rest of ROS, see pertinent positives sand negatives in HPI  Current Outpatient Medications on File Prior to Visit  Medication Sig Dispense Refill   budesonide-formoterol (SYMBICORT) 160-4.5 MCG/ACT inhaler Inhale 2 puffs into the lungs 2 (two) times daily. 1 each 3   Cholecalciferol (VITAMIN D) 50 MCG (2000 UT) tablet Take 2,000 Units by mouth daily.     diclofenac sodium (VOLTAREN) 1 % GEL Apply topically 4 (four) times daily.     escitalopram (LEXAPRO) 10 MG tablet Take 1 tablet (10 mg total) by mouth daily. 90 tablet 0   estradiol (ESTRACE) 0.1 MG/GM vaginal cream Place 1 Applicatorful vaginally 2 (two) times a week. 42.5 g 6   fluticasone (FLONASE) 50 MCG/ACT nasal spray Place into both nostrils as needed for allergies or rhinitis.     hydroxychloroquine (PLAQUENIL) 200 MG tablet Take 1 tablet (200 mg total) by mouth 2 (two) times daily. 180 tablet 0   hyoscyamine (LEVSIN SL) 0.125 MG SL tablet Dissolve 1 tablet on the tongue every 4-6 hours and needed for abdominal cramping/diarrhea 30 tablet 3   levothyroxine (SYNTHROID) 150 MCG tablet 1 tab Monday,Wed,Friday,Sat,and Sun. 90 tablet 0   ondansetron (ZOFRAN) 4 MG tablet Take 1 tablet 30 minutes before the start of your colon prep the night before and 1 tablet the day of your prep 30  Minutes before the start of second dose... the extra two tablets use as needed (Patient taking differently: Take 1 tablet 30 minutes before the start of your colon prep the night before and 1 tablet the day of your prep 30  Minutes before the start of second dose... the extra two tablets use as needed) 4 tablet 0   pramoxine-hydrocortisone  (PROCTOCREAM-HC) 1-1 % rectal cream Place 1 application rectally 2 (two) times daily as needed for hemorrhoids or anal itching. 30 g 1   pravastatin (PRAVACHOL) 20 MG tablet Take 1 tablet (20 mg total) by mouth daily. 90 tablet 0   No current facility-administered medications on file prior to visit.     Past Medical History:  Diagnosis Date   Allergy    Arthritis    Asthma    GERD (gastroesophageal reflux disease)    Hyperlipidemia    Hypertension    Thyroid cancer (HCC)    thyroid   Thyroid disease    Postsurgical hypothyroidism   Allergies  Allergen Reactions   Lovenox [Enoxaparin Sodium] Rash    rash    Social History   Socioeconomic History   Marital status: Married    Spouse name: Not on file   Number of children: Not on file   Years of education: Not on file   Highest education level: Not on file  Occupational History   Not on file  Tobacco Use   Smoking status: Never   Smokeless tobacco: Never  Vaping Use   Vaping Use: Never used  Substance and Sexual Activity   Alcohol use: Yes    Comment: occ wine    Drug use: Never   Sexual activity: Yes    Birth control/protection: None  Other Topics Concern   Not on file  Social History Narrative   Not on file   Social Determinants of Health   Financial Resource Strain: Not on file  Food Insecurity: No Food Insecurity   Worried About Running Out of Food in the Last Year: Never true   Ran Out of Food in the Last Year: Never true  Transportation Needs: No Transportation Needs   Lack of Transportation (Medical): No   Lack of Transportation (Non-Medical): No  Physical Activity: Insufficiently Active   Days of Exercise per Week: 4 days   Minutes of Exercise per Session: 20 min  Stress: No Stress Concern Present   Feeling of Stress : Not at all  Social Connections: Moderately Integrated   Frequency of Communication with Friends and Family: More than three times a week   Frequency of Social Gatherings with  Friends and Family: Three times a week   Attends Religious Services: More than 4 times per year   Active Member of Clubs or Organizations: No   Attends Archivist Meetings: Never   Marital Status: Married   Vitals:   06/29/21 1147  BP: 120/70  Pulse: 75  Resp: 16  SpO2: 94%   Wt Readings from Last 3 Encounters:  06/29/21 209 lb (94.8 kg)  04/29/21 209 lb (94.8 kg)  04/29/21 210 lb 12.8 oz (95.6 kg)   Body mass index is 31.78 kg/m.  Physical Exam Vitals and nursing note reviewed.  Constitutional:      General: She is not in acute distress.    Appearance: She is well-developed.  HENT:     Head: Normocephalic and atraumatic.     Mouth/Throat:     Mouth: Mucous membranes are moist.     Pharynx: Oropharynx  is clear.  Eyes:     Conjunctiva/sclera: Conjunctivae normal.  Cardiovascular:     Rate and Rhythm: Normal rate and regular rhythm.     Pulses:          Dorsalis pedis pulses are 2+ on the right side and 2+ on the left side.     Heart sounds: No murmur heard. Pulmonary:     Effort: Pulmonary effort is normal. No respiratory distress.     Breath sounds: Normal breath sounds.  Abdominal:     Palpations: Abdomen is soft. There is no hepatomegaly or mass.     Tenderness: There is no abdominal tenderness.  Lymphadenopathy:     Cervical: No cervical adenopathy.  Skin:    General: Skin is warm.     Findings: No erythema or rash.  Neurological:     General: No focal deficit present.     Mental Status: She is alert and oriented to person, place, and time.     Cranial Nerves: No cranial nerve deficit.     Gait: Gait normal.  Psychiatric:     Comments: Well groomed, good eye contact.   ASSESSMENT AND PLAN:  Ms. Yazmine Sorey was seen today for follow-up.  Orders Placed This Encounter  Procedures   Flu Vaccine QUAD High Dose(Fluad)   T4, free   TSH   Lipid panel   Lab Results  Component Value Date   TSH 0.09 (L) 06/29/2021   Lab Results   Component Value Date   CHOL 174 06/29/2021   HDL 79.40 06/29/2021   LDLCALC 78 06/29/2021   TRIG 85.0 06/29/2021   CHOLHDL 2 06/29/2021   Postoperative hypothyroidism Last TSH abnormal. Continue same dose of Levothyroxine. Further recommendations according to TSH result.  Essential hypertension BP adequately controlled. Continue current management: HCTZ 12.5 mg  DASH/low salt diet to continue. Continue monitoring BP at home. Eye exam is current.  Aortic atherosclerosis (HCC) Seen on imaging. Continue Pravastatin 20 mg daily.  Dyslipidemia Continue Pravastatin same dose and low fat diet. Further recommendations according to lipid panel result.  Depression, major, in remission (HCC) Continue Lexapro 10 mg daily.  Need for influenza vaccination -     Flu Vaccine QUAD High Dose(Fluad)  Return in about 6 months (around 12/28/2021) for cpe.  Kymberlie Brazeau G. [place], MD  [facility]. [place] office.

## 2021-06-29 NOTE — Patient Instructions (Addendum)
A few things to remember from today's visit:   Essential hypertension  Need for influenza vaccination - Plan: Flu Vaccine QUAD High Dose(Fluad)  Postoperative hypothyroidism - Plan: T4, free, TSH  Aortic atherosclerosis ([place]), Chronic - Plan: Lipid panel  Dyslipidemia - Plan: Lipid panel  If you need refills please call your pharmacy. Do not use My Chart to request refills or for acute issues that need immediate attention.   No changes today. Depending on thyroid test we may need to adjust medication.  Please be sure medication list is accurate. If a new problem present, please set up appointment sooner than planned today.

## 2021-07-03 ENCOUNTER — Encounter: Payer: Self-pay | Admitting: Family Medicine

## 2021-07-03 MED ORDER — PRAVASTATIN SODIUM 20 MG PO TABS: 20.0000 mg | ORAL_TABLET | Freq: Every day | ORAL | 2 refills | Status: DC

## 2021-07-03 MED ORDER — AMLODIPINE BESY-BENAZEPRIL HCL 5-40 MG PO CAPS: 1.0000 | ORAL_CAPSULE | Freq: Every day | ORAL | 2 refills | Status: DC

## 2021-07-03 MED ORDER — HYDROCHLOROTHIAZIDE 12.5 MG PO TABS: 12.5000 mg | ORAL_TABLET | Freq: Every day | ORAL | 2 refills | Status: DC

## 2021-07-19 ENCOUNTER — Other Ambulatory Visit: Payer: Self-pay | Admitting: Physician Assistant

## 2021-07-20 ENCOUNTER — Other Ambulatory Visit: Payer: Self-pay | Admitting: Family Medicine

## 2021-07-20 DIAGNOSIS — F325 Major depressive disorder, single episode, in full remission: Secondary | ICD-10-CM

## 2021-07-27 DIAGNOSIS — L821 Other seborrheic keratosis: Secondary | ICD-10-CM | POA: Diagnosis not present

## 2021-07-27 DIAGNOSIS — D1801 Hemangioma of skin and subcutaneous tissue: Secondary | ICD-10-CM | POA: Diagnosis not present

## 2021-07-27 DIAGNOSIS — L814 Other melanin hyperpigmentation: Secondary | ICD-10-CM | POA: Diagnosis not present

## 2021-07-27 DIAGNOSIS — L309 Dermatitis, unspecified: Secondary | ICD-10-CM | POA: Diagnosis not present

## 2021-07-27 DIAGNOSIS — L570 Actinic keratosis: Secondary | ICD-10-CM | POA: Diagnosis not present

## 2021-07-27 DIAGNOSIS — L720 Epidermal cyst: Secondary | ICD-10-CM | POA: Diagnosis not present

## 2021-07-27 DIAGNOSIS — D485 Neoplasm of uncertain behavior of skin: Secondary | ICD-10-CM | POA: Diagnosis not present

## 2021-07-27 DIAGNOSIS — L820 Inflamed seborrheic keratosis: Secondary | ICD-10-CM | POA: Diagnosis not present

## 2021-08-16 ENCOUNTER — Telehealth: Payer: Self-pay

## 2021-08-16 ENCOUNTER — Encounter: Payer: Self-pay | Admitting: Rheumatology

## 2021-08-16 ENCOUNTER — Telehealth: Payer: Self-pay | Admitting: Family Medicine

## 2021-08-16 NOTE — Telephone Encounter (Signed)
-----   Message from Rodrigo Ran, [place] sent at 07/05/2021  3:54 PM EDT ----- Schedule TSH in 6 weeks.

## 2021-08-16 NOTE — Telephone Encounter (Signed)
Left voicemail to get patient scheduled for a repeat TSH lab per Dr.[name].

## 2021-08-16 NOTE — Telephone Encounter (Signed)
Can you get her scheduled for a repeat TSH this week? Thank you!

## 2021-08-16 NOTE — Telephone Encounter (Signed)
Next Visit: 09/29/2021  Last Visit: 04/29/2021   Labs: 04/29/2021 CBC and CMP WNL  Eye exam:  01/13/2021  WNL     Current Dose per office note 04/29/2021: Plaquenil 200 mg 1 tablet by mouth twice daily.  [id number] arthritis of multiple sites with negative rheumatoid factor  Last Fill: 05/18/2021  Okay to refill Plaquenil?

## 2021-08-17 MED ORDER — HYDROXYCHLOROQUINE SULFATE 200 MG PO TABS: 200.0000 mg | ORAL_TABLET | Freq: Two times a day (BID) | ORAL | 0 refills | Status: DC

## 2021-08-18 ENCOUNTER — Telehealth: Payer: Self-pay | Admitting: Pharmacist

## 2021-08-18 NOTE — Chronic Care Management (AMB) (Signed)
Chronic Care Management Pharmacy Assistant   Name: Mercedes Hodge  MRN: [id number] DOB: [birth year]  Reason for Encounter: Disease State   Conditions to be addressed/monitored: HTN  Recent office visits:  06/29/21 Martinique, Betty G, MD - Patient presented for Postoperative Hyperthyroidism and other concerns.  Decreased Levothyroxine to 150 mg on M,W,F,Sat and Sun  Recent consult visits:  None   Hospital visits:  None in previous 6 months  Medications: Outpatient Encounter Medications as of 08/18/2021  Medication Sig   amLODipine-benazepril (LOTREL) 5-40 MG capsule Take 1 capsule by mouth daily.   Cholecalciferol (VITAMIN D) 50 MCG (2000 UT) tablet Take 2,000 Units by mouth daily.   diclofenac sodium (VOLTAREN) 1 % GEL Apply topically 4 (four) times daily.   escitalopram (LEXAPRO) 10 MG tablet TAKE 1 TABLET BY MOUTH  DAILY   estradiol (ESTRACE) 0.1 MG/GM vaginal cream Place 1 Applicatorful vaginally 2 (two) times a week.   fluticasone (FLONASE) 50 MCG/ACT nasal spray Place into both nostrils as needed for allergies or rhinitis.   hydrochlorothiazide (HYDRODIURIL) 12.5 MG tablet Take 1 tablet (12.5 mg total) by mouth daily.   hydroxychloroquine (PLAQUENIL) 200 MG tablet Take 1 tablet (200 mg total) by mouth 2 (two) times daily.   hyoscyamine (LEVSIN SL) 0.125 MG SL tablet Dissolve 1 tablet on the tongue every 4-6 hours and needed for abdominal cramping/diarrhea   levothyroxine (SYNTHROID) 150 MCG tablet 1 tab Monday,Wed,Friday,Sat,and Sun.   ondansetron (ZOFRAN) 4 MG tablet Take 1 tablet 30 minutes before the start of your colon prep the night before and 1 tablet the day of your prep 30  Minutes before the start of second dose... the extra two tablets use as needed (Patient taking differently: Take 1 tablet 30 minutes before the start of your colon prep the night before and 1 tablet the day of your prep 30  Minutes before the start of second dose... the extra two tablets use as  needed)   pramoxine-hydrocortisone (PROCTOCREAM-HC) 1-1 % rectal cream Place 1 application rectally 2 (two) times daily as needed for hemorrhoids or anal itching.   pravastatin (PRAVACHOL) 20 MG tablet Take 1 tablet (20 mg total) by mouth daily.   SYMBICORT 160-4.5 MCG/ACT inhaler USE 2 INHALATIONS BY MOUTH  TWICE DAILY   No facility-administered encounter medications on file as of 08/18/2021.  Reviewed chart prior to disease state call. Spoke with patient regarding BP  Recent Office Vitals: BP Readings from Last 3 Encounters:  06/29/21 120/70  04/29/21 112/60  04/29/21 121/78   Pulse Readings from Last 3 Encounters:  06/29/21 75  04/29/21 71  04/29/21 69    Wt Readings from Last 3 Encounters:  06/29/21 209 lb (94.8 kg)  04/29/21 209 lb (94.8 kg)  04/29/21 210 lb 12.8 oz (95.6 kg)     Kidney Function Lab Results  Component Value Date/Time   CREATININE 0.87 04/29/2021 09:57 AM   CREATININE 0.77 11/27/2020 10:10 AM   GFRNONAA 80 11/27/2020 10:10 AM   GFRAA 93 11/27/2020 10:10 AM    BMP Latest Ref Rng & Units 04/29/2021 11/27/2020 06/29/2020  Glucose 65 - 99 mg/dL 88 86 85  BUN 7 - 25 mg/dL 20 15 15   Creatinine 0.50 - 1.05 mg/dL 0.87 0.77 0.74  BUN/Creat Ratio 6 - 22 (calc) NOT APPLICABLE NOT APPLICABLE NOT APPLICABLE  Sodium 836 - 146 mmol/L 138 138 137  Potassium 3.5 - 5.3 mmol/L 4.2 4.0 3.6  Chloride 98 - 110 mmol/L 101 101 102  CO2  20 - 32 mmol/L 30 27 28   Calcium 8.6 - 10.4 mg/dL 9.7 9.0 9.3    Current antihypertensive regimen:  Amlodipine-benazepril 5-40 mg daily HCTZ 12.5 mg daily  How often are you checking your Blood Pressure? Patient reports she is checking about once or twice a month. Current home BP readings: Patient reports it is not been going below 118/60 or higher than 120 What recent interventions/DTPs have been made by any provider to improve Blood Pressure control since last CPP Visit: Patent reports none.  Any recent hospitalizations or ED visits  since last visit with CPP? No What diet changes have been made to improve Blood Pressure Control?  Patient reports still egg for breakfasts and for lunch / dinner she will eat leftovers  What exercise is being done to improve your Blood Pressure Control?  Patient attends her water aerobics and walking  Adherence Review: Is the patient currently on ACE/ARB medication? Yes Does the patient have >5 day gap between last estimated fill dates? No   Care Gaps: COVID Booster #4 [employer]) - Overdue AWV - 10/19/20 CCM-09/28/21 BP- 120/70 ( 06/29/21)  Star Rating Drugs: Pravastatin 20mg  - Last filled 07/22/2021 90 DS at [facility] Amlodipine - Benazapril 5-40mg  - Last filled 11/11/ 2022 90 DS at [facility]  Patient Assistance: Patient reports none  [place] Pharmacist Assistant [phone]

## 2021-08-24 ENCOUNTER — Other Ambulatory Visit: Payer: Self-pay | Admitting: Family Medicine

## 2021-08-24 DIAGNOSIS — Z1231 Encounter for screening mammogram for malignant neoplasm of breast: Secondary | ICD-10-CM

## 2021-09-16 NOTE — Progress Notes (Signed)
Office Visit Note  Patient: Mercedes Hodge             Date of Birth: 09/25/1955           MRN: [id number]             PCP: Martinique, Betty G, MD Referring: Martinique, Betty G, MD Visit Date: 09/29/2021 Occupation: @GUAROCC @  Subjective:  Pain in left hip   History of Present Illness: Dudley Cooley is a 66 y.o. female with history of rheumatoid arthritis.  She states she has been doing well on hydroxychloroquine.  She denies any joint swelling.  She states she has some discomfort in right shoulder when she sleeps on the right side and some discomfort in her left hip when she sleeps on her left side.  She has been tolerating hydroxychloroquine without any side effects.  Activities of Daily Living:  Patient reports morning stiffness for 15-20 minutes.   Patient Reports nocturnal pain.  Difficulty dressing/grooming: Denies Difficulty climbing stairs: Denies Difficulty getting out of chair: Denies Difficulty using hands for taps, buttons, cutlery, and/or writing: Denies  Review of Systems  Constitutional:  Negative for fatigue.  HENT:  Negative for mouth sores, mouth dryness and nose dryness.   Eyes:  Negative for pain, itching and dryness.  Respiratory:  Negative for shortness of breath and difficulty breathing.   Cardiovascular:  Negative for chest pain and palpitations.  Gastrointestinal:  Negative for blood in stool, constipation and diarrhea.  Endocrine: Negative for increased urination.  Genitourinary:  Negative for difficulty urinating.  Musculoskeletal:  Positive for joint pain, joint pain, myalgias, morning stiffness, muscle tenderness and myalgias. Negative for joint swelling.  Skin:  Negative for color change, rash and redness.  Allergic/Immunologic: Negative for susceptible to infections.  Neurological:  Negative for dizziness, numbness, headaches, memory loss and weakness.  Hematological:  Positive for bruising/bleeding tendency.  Psychiatric/Behavioral:   Negative for confusion.    PMFS History:  Patient Active Problem List   Diagnosis Date Noted   Depression, major, in remission ([place]) 06/29/2021   Aortic atherosclerosis ([place]) 08/18/2020   History of colonic polyps 07/20/2020   Atrophic vaginitis 02/17/2020   Rheumatoid arthritis of multiple sites with negative rheumatoid factor ([place]) 07/02/2019   High risk medication use 07/02/2019   Primary osteoarthritis of both hands 07/02/2019   Status post total bilateral knee replacement 07/02/2019   Status post right ankle joint replacement 07/02/2019   Essential hypertension 07/02/2019   History of asthma 07/02/2019   History of thyroid cancer 07/02/2019   Postoperative hypothyroidism 07/02/2019   Dyslipidemia 07/02/2019   Gastroesophageal reflux disease without esophagitis 07/02/2019   Osteopenia of multiple sites 07/02/2019    Past Medical History:  Diagnosis Date   Allergy    Arthritis    Asthma    GERD (gastroesophageal reflux disease)    Hyperlipidemia    Hypertension    Thyroid cancer ([place])    thyroid   Thyroid disease    Postsurgical hypothyroidism    Family History  Problem Relation Age of Onset   COPD Mother    Heart disease Mother    Cancer Father        thyroid cancer   Juvenile Rhematoid Arthritis Sister    Stroke Sister    Thyroid cancer Brother    Thyroid cancer Daughter    Healthy Daughter    Healthy Son    Healthy Daughter    Colon cancer Neg Hx    Esophageal cancer Neg Hx  Pancreatic cancer Neg Hx    Stomach cancer Neg Hx    Liver disease Neg Hx    Past Surgical History:  Procedure Laterality Date   ABDOMINAL HYSTERECTOMY  1993   CHOLECYSTECTOMY  1982   EXTRACORPOREAL SHOCK WAVE LITHOTRIPSY Left 09/17/2020   Procedure: EXTRACORPOREAL SHOCK WAVE LITHOTRIPSY (ESWL);  Surgeon: Franchot Gallo, MD;  Location[facility];  Service: Urology;  Laterality: Left;   LAPAROSCOPIC GASTRIC SLEEVE RESECTION  07/2018   left knee replacement   2008   right ankle replacement  2012   right knee replacement  2009   trimallar FX  1995   right ankle   Social History   Social History Narrative   Not on file   Immunization History  Administered Date(s) Administered   Fluad Quad(high Dose 65+) 06/29/2021   Influenza-Unspecified 05/22/2019, 06/10/2020   PFIZER Comirnaty(Gray Top)Covid-19 Tri-Sucrose Vaccine 05/08/2021   PFIZER(Purple Top)SARS-COV-2 Vaccination 11/17/2019, 12/08/2019, 06/10/2020   Pneumococcal Conjugate-13 06/07/2019   Pneumococcal Polysaccharide-23 08/18/2020   Tdap 06/07/2019   Zoster Recombinat (Shingrix) 12/23/2020, 02/27/2021     Objective: Vital Signs: BP 115/74 (BP Location: Left Arm, Patient Position: Sitting, Cuff Size: Large)    Pulse 74    Ht 5\' 7"  (1.702 m)    Wt 210 lb 9.6 oz (95.5 kg)    BMI 32.98 kg/m    Physical Exam Vitals and nursing note reviewed.  Constitutional:      Appearance: She is well-developed.  HENT:     Head: Normocephalic and atraumatic.  Eyes:     Conjunctiva/sclera: Conjunctivae normal.  Cardiovascular:     Rate and Rhythm: Normal rate and regular rhythm.     Heart sounds: Normal heart sounds.  Pulmonary:     Effort: Pulmonary effort is normal.     Breath sounds: Normal breath sounds.  Abdominal:     General: Bowel sounds are normal.     Palpations: Abdomen is soft.  Musculoskeletal:     Cervical back: Normal range of motion.  Lymphadenopathy:     Cervical: No cervical adenopathy.  Skin:    General: Skin is warm and dry.     Capillary Refill: Capillary refill takes less than 2 seconds.  Neurological:     Mental Status: She is alert and oriented to person, place, and time.  Psychiatric:        Behavior: Behavior normal.     Musculoskeletal Exam: Spine was in good range of motion.  Shoulder joints, elbow joints, wrist joints, MCPs PIPs and DIPs with good range of motion with no synovitis.  Hip joints,, knee joints in good range of motion.  Bilateral knee joints  are replaced.  Right ankle joint is replaced which was thickened.  No synovitis was noted.  CDAI Exam: CDAI Score: 0.4  Patient Global: 2 mm; Provider Global: 2 mm Swollen: 0 ; Tender: 0  Joint Exam 09/29/2021   No joint exam has been documented for this visit   There is currently no information documented on the homunculus. Go to the Rheumatology activity and complete the homunculus joint exam.  Investigation: No additional findings.  Imaging: MM 3D SCREEN BREAST BILATERAL  Result Date: 09/24/2021 CLINICAL DATA:  Screening. EXAM: DIGITAL SCREENING BILATERAL MAMMOGRAM WITH TOMOSYNTHESIS AND CAD TECHNIQUE: Bilateral screening digital craniocaudal and mediolateral oblique mammograms were obtained. Bilateral screening digital breast tomosynthesis was performed. The images were evaluated with computer-aided detection. COMPARISON:  Previous exam(s). ACR Breast Density Category b: There are scattered areas of fibroglandular density. FINDINGS:  There are no findings suspicious for malignancy. IMPRESSION: No mammographic evidence of malignancy. A result letter of this screening mammogram will be mailed directly to the patient. RECOMMENDATION: Screening mammogram in one year. (Code:SM-B-01Y) BI-RADS CATEGORY  1: Negative. Electronically Signed   By: Kristopher Oppenheim M.D.   On: 09/24/2021 15:50    Recent Labs: Lab Results  Component Value Date   WBC 6.3 04/29/2021   HGB 14.7 04/29/2021   PLT 203 04/29/2021   NA 138 04/29/2021   K 4.2 04/29/2021   CL 101 04/29/2021   CO2 30 04/29/2021   GLUCOSE 88 04/29/2021   BUN 20 04/29/2021   CREATININE 0.87 04/29/2021   BILITOT 0.7 04/29/2021   AST 24 04/29/2021   ALT 17 04/29/2021   PROT 6.8 04/29/2021   CALCIUM 9.7 04/29/2021   GFRAA 93 11/27/2020    Speciality Comments: Plaquenil eye exam: 01/13/2021  WNL Follow up in 1 year  Procedures:  No procedures performed Allergies: Lovenox [enoxaparin sodium]   Assessment / Plan:     Visit Diagnoses:  Rheumatoid arthritis of multiple sites with negative rheumatoid factor (HCC) - Positive anti-CCP (repeat negative) and elevated CRP, Diagnosed in [place] by her rheumatologist. Hx of inflammatory arthritis: Patient had no synovitis on my examination.  High risk medication use - aquenil 200 mg 1 tablet by mouth twice daily.  Plaquenil eye exam: 01/13/2021. - Plan: CBC with Differential/Platelet, COMPLETE METABOLIC PANEL WITH GFR today and then every 5 months.  Information immunization was also placed in the AVS.  Chronic right shoulder pain-she been experiencing discomfort in her right shoulder when she lays on the right side.  She has been doing water aerobics on a regular basis.  She had good range of motion without discomfort and point tenderness.  A handout on shoulder exercises was given.  Primary osteoarthritis of both hands-she has bilateral PIP and DIP thickening.  No synovitis was noted.  Trigger finger, right middle finger-resolved.  Trochanteric bursitis of both hips-she complains of discomfort in the left trochanteric bursa.  She had tenderness over left trochanteric area.  A handout on IT band stretches was given.  Status post total bilateral knee replacement-she had good range of motion without discomfort.  Status post right ankle joint replacement - History of Charcot joint which required total ankle replacement.    Osteopenia of multiple sites - DEXA from May 2021 which showed a T score of -1.1.  We will repeat DEXA scan next year.  Essential hypertension-blood pressure was normal.  History of thyroid cancer - Plan: TSH  History of asthma  Gastroesophageal reflux disease without esophagitis  Dyslipidemia  Postoperative hypothyroidism  Orders: Orders Placed This Encounter  Procedures   CBC with Differential/Platelet   COMPLETE METABOLIC PANEL WITH GFR   TSH   No orders of the defined types were placed in this encounter.    Follow-Up Instructions: Return in  about 5 months (around 02/27/2022) for Rheumatoid arthritis.   Bo Merino, MD  Note - This record has been created using Editor, commissioning.  Chart creation errors have been sought, but may not always  have been located. Such creation errors do not reflect on  the standard of medical care.

## 2021-09-23 ENCOUNTER — Telehealth: Payer: Self-pay | Admitting: Pharmacist

## 2021-09-23 NOTE — Chronic Care Management (AMB) (Signed)
° ° °  Chronic Care Management Pharmacy Assistant   Name: Ailie Gage  MRN: [id number] DOB: 12/28/1955  09/23/21 APPOINTMENT REMINDER   Patient was reminded to have all medications, supplements and any blood glucose and blood pressure readings available for review with [name], Pharm. D, for telephone visit on 09/28/21 at 12.  Care Gaps: COVID Booster #4 [employer]) - Overdue AWV - 10/19/20 CCM-09/28/21 BP- 120/70 ( 06/29/21)  Star Rating Drug: Pravastatin 20mg  - Last filled 07/22/2021 90 DS at [facility] Amlodipine - Benazapril 5-40mg  - Last filled 11/11/ 2022 90 DS at [facility]  Any gaps in medications fill history? None    Medications: Outpatient Encounter Medications as of 09/23/2021  Medication Sig   amLODipine-benazepril (LOTREL) 5-40 MG capsule Take 1 capsule by mouth daily.   Cholecalciferol (VITAMIN D) 50 MCG (2000 UT) tablet Take 2,000 Units by mouth daily.   diclofenac sodium (VOLTAREN) 1 % GEL Apply topically 4 (four) times daily.   escitalopram (LEXAPRO) 10 MG tablet TAKE 1 TABLET BY MOUTH  DAILY   estradiol (ESTRACE) 0.1 MG/GM vaginal cream Place 1 Applicatorful vaginally 2 (two) times a week.   fluticasone (FLONASE) 50 MCG/ACT nasal spray Place into both nostrils as needed for allergies or rhinitis.   hydrochlorothiazide (HYDRODIURIL) 12.5 MG tablet Take 1 tablet (12.5 mg total) by mouth daily.   hydroxychloroquine (PLAQUENIL) 200 MG tablet Take 1 tablet (200 mg total) by mouth 2 (two) times daily.   hyoscyamine (LEVSIN SL) 0.125 MG SL tablet Dissolve 1 tablet on the tongue every 4-6 hours and needed for abdominal cramping/diarrhea   levothyroxine (SYNTHROID) 150 MCG tablet 1 tab Monday,Wed,Friday,Sat,and Sun.   ondansetron (ZOFRAN) 4 MG tablet Take 1 tablet 30 minutes before the start of your colon prep the night before and 1 tablet the day of your prep 30  Minutes before the start of second dose... the extra two tablets use as needed (Patient taking differently: Take 1  tablet 30 minutes before the start of your colon prep the night before and 1 tablet the day of your prep 30  Minutes before the start of second dose... the extra two tablets use as needed)   pramoxine-hydrocortisone (PROCTOCREAM-HC) 1-1 % rectal cream Place 1 application rectally 2 (two) times daily as needed for hemorrhoids or anal itching.   pravastatin (PRAVACHOL) 20 MG tablet Take 1 tablet (20 mg total) by mouth daily.   SYMBICORT 160-4.5 MCG/ACT inhaler USE 2 INHALATIONS BY MOUTH  TWICE DAILY   No facility-administered encounter medications on file as of 09/23/2021.     [place] Clinical Pharmacist Assistant [phone]

## 2021-09-24 ENCOUNTER — Ambulatory Visit
Admission: RE | Admit: 2021-09-24 | Discharge: 2021-09-24 | Disposition: A | Discharge status: Home / Self Care | Payer: Medicare Other | Source: Ambulatory Visit

## 2021-09-24 DIAGNOSIS — Z1231 Encounter for screening mammogram for malignant neoplasm of breast: Secondary | ICD-10-CM | POA: Diagnosis not present

## 2021-09-28 ENCOUNTER — Ambulatory Visit (INDEPENDENT_AMBULATORY_CARE_PROVIDER_SITE_OTHER): Payer: Medicare Other | Admitting: Pharmacist

## 2021-09-28 DIAGNOSIS — E890 Postprocedural hypothyroidism: Secondary | ICD-10-CM

## 2021-09-28 DIAGNOSIS — I10 Essential (primary) hypertension: Secondary | ICD-10-CM

## 2021-09-28 NOTE — Progress Notes (Signed)
Chronic Care Management Pharmacy Note  09/29/2021 Name:  Mercedes Hodge MRN:  [id number] DOB:  07/20/1956  Summary: BP is at goal < 130/80 LDL is at goal < 100 TSH is not optimal for concurrent osteopenia  Recommendations/Changes made from today's visit: -Recommended using Symbicort as a rescue inhaler as needed -Recommended switching to calcium citrate once she has finished her current supply for better absorption -Recommended rechecking TSH and decreasing dose depending on result if TSH not optimal for osteopenia/osteoporosis  Plan: BP assessment in 6 months   Subjective: Mercedes Hodge is an 66 y.o. year old female who is a primary patient of Martinique, Malka So, MD.  The CCM team was consulted for assistance with disease management and care coordination needs.    Engaged with patient by telephone for follow up visit in response to provider referral for pharmacy case management and/or care coordination services.   Consent to Services:  The patient was given information about Chronic Care Management services, agreed to services, and gave verbal consent prior to initiation of services.  Please see initial visit note for detailed documentation.   Patient Care Team: Martinique, Betty G, MD as PCP - General (Family Medicine) Viona Gilmore, [facility] as Pharmacist (Pharmacist)  Recent office visits: 06/29/21 Martinique, Betty G, MD - Patient presented for Postoperative Hyperthyroidism and other concerns. Decreased Levothyroxine to 150 mg on M,W,F,Sat and Sun.  Recent consult visits: 04/29/2021 - Bo Merino, MD (Rheumatology) - Patient presented for Rheumatoid arthritis of multiple sites with negative rheumatoid factor and other concerns.No medication changes.  01/13/21 Clent Jacks (Ophthalmology) - Patient presented for Eye exam and other issues. No medication changes noted.   4-5-2022Nandigam, Venia Minks, MD Gertie Fey) - Patient presented for rectal bleeding. Prescribed Zofran 4mg   PRN.  Hospital visits: None in previous 6 months   Objective:  Lab Results  Component Value Date   CREATININE 0.87 04/29/2021   BUN 20 04/29/2021   GFRNONAA 80 11/27/2020   GFRAA 93 11/27/2020   NA 138 04/29/2021   K 4.2 04/29/2021   CALCIUM 9.7 04/29/2021   CO2 30 04/29/2021   GLUCOSE 88 04/29/2021    No results found for: HGBA1C, FRUCTOSAMINE, GFR, MICROALBUR  Last diabetic Eye exam: No results found for: HMDIABEYEEXA  Last diabetic Foot exam: No results found for: HMDIABFOOTEX   Lab Results  Component Value Date   CHOL 174 06/29/2021   HDL 79.40 06/29/2021   LDLCALC 78 06/29/2021   TRIG 85.0 06/29/2021   CHOLHDL 2 06/29/2021    Hepatic Function Latest Ref Rng & Units 04/29/2021 11/27/2020 06/29/2020  Total Protein 6.1 - 8.1 g/dL 6.8 6.6 6.4  AST 10 - 35 U/L 24 19 19   ALT 6 - 29 U/L 17 15 13   Total Bilirubin 0.2 - 1.2 mg/dL 0.7 0.4 0.9    Lab Results  Component Value Date/Time   TSH 0.09 (L) 06/29/2021 12:57 PM   TSH 0.05 (L) 11/27/2020 10:10 AM   FREET4 1.27 06/29/2021 12:57 PM    CBC Latest Ref Rng & Units 04/29/2021 12/15/2020 11/27/2020  WBC 3.8 - 10.8 Thousand/uL 6.3 - 7.3  Hemoglobin 11.7 - 15.5 g/dL 14.7 13.6 13.6  Hematocrit 35.0 - 45.0 % 42.9 39.1 40.1  Platelets 140 - 400 Thousand/uL 203 - 252    No results found for: VD25OH  Clinical ASCVD: No  The 10-year ASCVD risk score (Arnett DK, et al., 2019) is: 6.8%   Values used to calculate the score:  Age: 64 years     Sex: Female     Is Non-Hispanic African American: No     Diabetic: No     Tobacco smoker: No     Systolic Blood Pressure: 761 mmHg     Is BP treated: Yes     HDL Cholesterol: 79.4 mg/dL     Total Cholesterol: 174 mg/dL    Depression screen [facility] 2/9 10/19/2020 08/23/2020 09/11/2019  Decreased Interest 0 0 0  Down, Depressed, Hopeless 0 0 0  PHQ - 2 Score 0 0 0  Altered sleeping - 0 0  Tired, decreased energy - 0 0  Change in appetite - 0 0  Feeling bad or failure about  yourself  - 0 0  Trouble concentrating - 0 0  Moving slowly or fidgety/restless - 0 0  Suicidal thoughts - 0 0  PHQ-9 Score - 0 0  Difficult doing work/chores - Not difficult at all -      Social History   Tobacco Use  Smoking Status Never  Smokeless Tobacco Never   BP Readings from Last 3 Encounters:  06/29/21 120/70  04/29/21 112/60  04/29/21 121/78   Pulse Readings from Last 3 Encounters:  06/29/21 75  04/29/21 71  04/29/21 69   Wt Readings from Last 3 Encounters:  06/29/21 209 lb (94.8 kg)  04/29/21 209 lb (94.8 kg)  04/29/21 210 lb 12.8 oz (95.6 kg)   BMI Readings from Last 3 Encounters:  06/29/21 31.78 kg/m  04/29/21 31.78 kg/m  04/29/21 32.05 kg/m    Assessment/Interventions: Review of patient past medical history, allergies, medications, health status, including review of consultants reports, laboratory and other test data, was performed as part of comprehensive evaluation and provision of chronic care management services.   SDOH:  (Social Determinants of Health) assessments and interventions performed: No  SDOH Screenings   Alcohol Screen: Low Risk    Last Alcohol Screening Score (AUDIT): 3  Depression (PHQ2-9): Low Risk    PHQ-2 Score: 0  Financial Resource Strain: Not on file  Food Insecurity: No Food Insecurity   Worried About Charity fundraiser in the Last Year: Never true   Ran Out of Food in the Last Year: Never true  Housing: Low Risk    Last Housing Risk Score: 0  Physical Activity: Insufficiently Active   Days of Exercise per Week: 4 days   Minutes of Exercise per Session: 20 min  Social Connections: Moderately Integrated   Frequency of Communication with Friends and Family: More than three times a week   Frequency of Social Gatherings with Friends and Family: Three times a week   Attends Religious Services: More than 4 times per year   Active Member of Clubs or Organizations: No   Attends Archivist Meetings: Never    Marital Status: Married  Stress: No Stress Concern Present   Feeling of Stress : Not at all  Tobacco Use: Low Risk    Smoking Tobacco Use: Never   Smokeless Tobacco Use: Never   Passive Exposure: Not on file  Transportation Needs: No Transportation Needs   Lack of Transportation (Medical): No   Lack of Transportation (Non-Medical): No    CCM Care Plan  Allergies  Allergen Reactions   Lovenox [Enoxaparin Sodium] Rash    rash    Medications Reviewed Today     Reviewed by Viona Gilmore, [facility] (Pharmacist) on 09/28/21 at 1226  Med List Status: <None>   Medication Order Taking? Sig Documenting  Provider Last Dose Status Informant  amLODipine-benazepril (LOTREL) 5-40 MG capsule [id number] Yes Take 1 capsule by mouth daily. Martinique, Betty G, MD Taking Active   calcium carbonate (OS-CAL) 600 MG tablet [id number] Yes Take 600 mg by mouth daily. [provider] Taking Active   Cholecalciferol (VITAMIN D) 50 MCG (2000 UT) tablet [id number] Yes Take 2,000 Units by mouth daily. [provider] Taking Active   diclofenac sodium (VOLTAREN) 1 % GEL [id number] Yes Apply topically 4 (four) times daily. [provider] Taking Active   escitalopram (LEXAPRO) 10 MG tablet [id number]  TAKE 1 TABLET BY MOUTH  DAILY Martinique, Betty G, MD  Active   fluticasone [facility]) 50 MCG/ACT nasal spray [id number] Yes Place into both nostrils as needed for allergies or rhinitis. [provider] Taking Active   hydrochlorothiazide (HYDRODIURIL) 12.5 MG tablet [id number] Yes Take 1 tablet (12.5 mg total) by mouth daily. Martinique, Betty G, MD Taking Active   hydroxychloroquine (PLAQUENIL) 200 MG tablet [id number]  Take 1 tablet (200 mg total) by mouth 2 (two) times daily. Ofilia Neas, PA-C  Active   levothyroxine (SYNTHROID) 150 MCG tablet [id number]  1 tab Monday,Wed,Friday,Sat,and Sun. Martinique, Betty G, MD  Active   pramoxine-hydrocortisone [facility]) 1-1 % rectal cream [id number]  Place 1  application rectally 2 (two) times daily as needed for hemorrhoids or anal itching. Mauri Pole, MD  Active   pravastatin (PRAVACHOL) 20 MG tablet [id number] Yes Take 1 tablet (20 mg total) by mouth daily. Martinique, Betty G, MD Taking Active   SYMBICORT 160-4.5 MCG/ACT inhaler [id number]  USE 2 INHALATIONS BY MOUTH  TWICE DAILY Martinique, Betty G, MD  Active             Patient Active Problem List   Diagnosis Date Noted   Depression, major, in remission ([place]) 06/29/2021   Aortic atherosclerosis ([place]) 08/18/2020   History of colonic polyps 07/20/2020   Atrophic vaginitis 02/17/2020   Rheumatoid arthritis of multiple sites with negative rheumatoid factor ([place]) 07/02/2019   High risk medication use 07/02/2019   Primary osteoarthritis of both hands 07/02/2019   Status post total bilateral knee replacement 07/02/2019   Status post right ankle joint replacement 07/02/2019   Essential hypertension 07/02/2019   History of asthma 07/02/2019   History of thyroid cancer 07/02/2019   Postoperative hypothyroidism 07/02/2019   Dyslipidemia 07/02/2019   Gastroesophageal reflux disease without esophagitis 07/02/2019   Osteopenia of multiple sites 07/02/2019    Immunization History  Administered Date(s) Administered   Fluad Quad(high Dose 65+) 06/29/2021   Influenza-Unspecified 05/22/2019, 06/10/2020   PFIZER Comirnaty(Gray Top)Covid-19 Tri-Sucrose Vaccine 05/08/2021   PFIZER(Purple Top)SARS-COV-2 Vaccination 11/17/2019, 12/08/2019, 06/10/2020   Pneumococcal Conjugate-13 06/07/2019   Pneumococcal Polysaccharide-23 08/18/2020   Tdap 06/07/2019   Zoster Recombinat (Shingrix) 12/23/2020, 02/27/2021    Conditions to be addressed/monitored:  Hypertension, Hyperlipidemia, GERD, Asthma, Hypothyroidism, Osteopenia, and Rheumatoid arthritis  Conditions addressed this visit: Hypertension, hypothyroidism, osteopenia  Care Plan : CCM Pharmacy Care Plan  Updates made by Viona Gilmore, [place]  since 09/29/2021 12:00 AM     Problem: Problem: Hypertension, Hyperlipidemia, GERD, Asthma, Hypothyroidism, Osteopenia, and Rheumatoid arthritis      Long-Range Goal: Patient-Specific Goal   Start Date: 09/28/2021  Expected End Date: 09/28/2022  This Visit's Progress: On track  Priority: High  Note:   Current Barriers:  Unable to achieve control of hypothyroidism   Pharmacist Clinical Goal(s):  Patient will achieve adherence to monitoring guidelines and medication adherence to achieve therapeutic efficacy  achieve control of hypothyroidism as evidenced by TSH  through collaboration with PharmD and provider.   Interventions: 1:1 collaboration with Martinique, Betty G, MD regarding development and update of comprehensive plan of care as evidenced by provider attestation and co-signature Inter-disciplinary care team collaboration (see longitudinal plan of care) Comprehensive medication review performed; medication list updated in electronic medical record  Hypertension (BP goal <130/80) -Controlled -Current treatment: Amlodipine-benazepril 5-40 mg daily  - Appropriate, Effective, Safe, Accessible HCTZ 12.5 mg daily - Appropriate, Effective, Safe, Accessible -Medications previously tried: none  -Current home readings: 108/65-120/70 (checking once a month)  -Current dietary habits: pays attention to sodium intake -Current exercise habits: water aerobics 3 times a week; arthritis class twice a week -Denies hypotensive/hypertensive symptoms -Educated on BP goals and benefits of medications for prevention of heart attack, stroke and kidney damage; Importance of home blood pressure monitoring; Proper BP monitoring technique; -Counseled to monitor BP at home weekly, document, and provide log at future appointments -Counseled on diet and exercise extensively Recommended to continue current medication  Hyperlipidemia: (LDL goal < 100) -Controlled -Current treatment: Pravastatin 20 mg 1 tablet  daily -Medications previously tried: none  -Current dietary patterns: did not discuss -Current exercise habits: water aerobics and arthritis class -Educated on Cholesterol goals;  Benefits of statin for ASCVD risk reduction; -Counseled on diet and exercise extensively Recommended to continue current medication  Asthma (Goal: control symptoms) -Controlled -Current treatment  Symbicort 160-4.5 mcg/act 2 puffs twice daily - Appropriate, Effective, Safe, Accessible  -Medications previously tried: Airduo, Firefighter (cost)  -Pulmonary function testing: unknown -Patient reports consistent use of maintenance inhaler -Frequency of rescue inhaler use: never (does not own one) -Counseled on Proper inhaler technique; Benefits of consistent maintenance inhaler use When to use rescue inhaler -Recommended to continue current medication Recommended using Symbicort as a rescue inhaler as needed.  Depression/Anxiety (Goal: minimize symptoms) -Controlled -Current treatment: Escitalopram 10 mg 1 tablet daily  -Medications previously tried/failed: none -PHQ9: 0 -GAD7: n/a -Educated on Benefits of medication for symptom control Benefits of cognitive-behavioral therapy with or without medication -Recommended to continue current medication  Osteopenia (Goal prevent fractures) -Not ideally controlled -Last DEXA Scan: 02/04/20              T-Score femoral neck: -0.9             T-Score total hip: -0.7             T-Score lumbar spine: -1.1             T-Score forearm radius: n/a             10-year probability of major osteoporotic fracture: 9.5%             10-year probability of hip fracture: 0.7% -Patient is not a candidate for pharmacologic treatment -Current treatment  Vitamin D3 2000 units daily Calcium carbonate 600 mg & vitamin D once daily  -Medications previously tried: none  -Recommend [phone] units of vitamin D daily. Recommend 1200 mg of calcium daily from dietary and supplemental  sources. Recommend weight-bearing and muscle strengthening exercises for building and maintaining bone density. -Recommended switching to calcium citrate once she has finished her current supply for better absorption.  Hypothyroidism (Goal: 2.5-4.5 given concomitant osteopenia) -Uncontrolled -Current treatment  Levothyroxine 150 mcg 1 tablet Mon, Wed, Fri, Sat and Sun -Medications previously tried: none  -Recommended rechecking TSH and decreasing dose depending on result if TSH not optimal for osteopenia/osteoporosis.  Rheumatoid arthritis (Goal: minimize symptoms) -  Controlled -Current treatment  Hydroxychloroquine 200 mg 1 tablet twice daily  -Medications previously tried: none  -Recommended to continue current medication   Health Maintenance -Vaccine gaps: none -Current therapy:  Estradiol 1% vaginal cream  Voltaren 1% gel (uses 1x weekly) Flonase 50 mcg/spray PRN (using 2 sprays daily)  -Educated on Cost vs benefit of each product must be carefully weighed by individual consumer -Patient is satisfied with current therapy and denies issues -Recommended to continue current medication  Patient Goals/Self-Care Activities Patient will:  - check blood pressure weekly, document, and provide at future appointments target a minimum of 150 minutes of moderate intensity exercise weekly  Follow Up Plan: Telephone follow up appointment with care management team member scheduled for: 1 year      Medication Assistance: None required.  Patient affirms current coverage meets needs.  Compliance/Adherence/Medication fill history: Care Gaps: None BP- 120/70 (06/29/21)  Star-Rating Drugs: Pravastatin 20mg  - Last filled 07/22/2021 90 DS at [facility] Amlodipine - Benazapril 5-40mg  - Last filled 11/11/ 2022 90 DS at [facility]  Patient's preferred pharmacy is:  [id number] , [place] [address] Phone: [phone] Fax:  [phone]  [facility] [place], [place] [facility] [address] [place] [address] Phone: [phone] Fax: [facility] - [place], [place] [address] [place] [address] Phone: [phone] Fax: [phone]  Uses pill box? Yes Pt endorses 99% compliance  We discussed: Current pharmacy is preferred with insurance plan and patient is satisfied with pharmacy services Patient decided to: Continue current medication management strategy  Care Plan and Follow Up Patient Decision:  Patient agrees to Care Plan and Follow-up.  Plan: Telephone follow up appointment with care management team member scheduled for:  1 year  [name], PharmD, [place] Pharmacist [place] at [place] [phone]

## 2021-09-29 ENCOUNTER — Encounter: Payer: Self-pay | Admitting: Rheumatology

## 2021-09-29 ENCOUNTER — Other Ambulatory Visit: Payer: Self-pay

## 2021-09-29 ENCOUNTER — Ambulatory Visit: Payer: Medicare Other | Admitting: Rheumatology

## 2021-09-29 VITALS — BP 115/74 | HR 74 | Ht 67.0 in | Wt 210.6 lb

## 2021-09-29 DIAGNOSIS — M19041 Primary osteoarthritis, right hand: Secondary | ICD-10-CM

## 2021-09-29 DIAGNOSIS — M25511 Pain in right shoulder: Secondary | ICD-10-CM

## 2021-09-29 DIAGNOSIS — M19042 Primary osteoarthritis, left hand: Secondary | ICD-10-CM

## 2021-09-29 DIAGNOSIS — M0609 Rheumatoid arthritis without rheumatoid factor, multiple sites: Secondary | ICD-10-CM

## 2021-09-29 DIAGNOSIS — I10 Essential (primary) hypertension: Secondary | ICD-10-CM

## 2021-09-29 DIAGNOSIS — M7061 Trochanteric bursitis, right hip: Secondary | ICD-10-CM | POA: Diagnosis not present

## 2021-09-29 DIAGNOSIS — Z96653 Presence of artificial knee joint, bilateral: Secondary | ICD-10-CM | POA: Diagnosis not present

## 2021-09-29 DIAGNOSIS — Z96661 Presence of right artificial ankle joint: Secondary | ICD-10-CM

## 2021-09-29 DIAGNOSIS — Z79899 Other long term (current) drug therapy: Secondary | ICD-10-CM | POA: Diagnosis not present

## 2021-09-29 DIAGNOSIS — E785 Hyperlipidemia, unspecified: Secondary | ICD-10-CM

## 2021-09-29 DIAGNOSIS — G8929 Other chronic pain: Secondary | ICD-10-CM

## 2021-09-29 DIAGNOSIS — M65331 Trigger finger, right middle finger: Secondary | ICD-10-CM | POA: Diagnosis not present

## 2021-09-29 DIAGNOSIS — M8589 Other specified disorders of bone density and structure, multiple sites: Secondary | ICD-10-CM

## 2021-09-29 DIAGNOSIS — Z8709 Personal history of other diseases of the respiratory system: Secondary | ICD-10-CM | POA: Diagnosis not present

## 2021-09-29 DIAGNOSIS — Z85850 Personal history of malignant neoplasm of thyroid: Secondary | ICD-10-CM | POA: Diagnosis not present

## 2021-09-29 DIAGNOSIS — E890 Postprocedural hypothyroidism: Secondary | ICD-10-CM

## 2021-09-29 DIAGNOSIS — M7062 Trochanteric bursitis, left hip: Secondary | ICD-10-CM

## 2021-09-29 DIAGNOSIS — K219 Gastro-esophageal reflux disease without esophagitis: Secondary | ICD-10-CM

## 2021-09-29 NOTE — Patient Instructions (Addendum)
Standing Labs We placed an order today for your standing lab work.   Please have your standing labs drawn in June  If possible, please have your labs drawn 2 weeks prior to your appointment so that the provider can discuss your results at your appointment.  Please note that you may see your imaging and lab results in [place] before we have reviewed them. We may be awaiting multiple results to interpret others before contacting you. Please allow our office up to 72 hours to thoroughly review all of the results before contacting the office for clarification of your results.  We have open lab daily: Monday through Thursday from 1:30-4:30 PM and Friday from 1:30-4:00 PM at the office of Dr. Bo Merino, [facility].   Please be advised, all patients with office appointments requiring lab work will take precedent over walk-in lab work.  If possible, please come for your lab work on Monday and Friday afternoons, as you may experience shorter wait times. The office is located at [address], [place], [place], [place] [address] No appointment is necessary.   Labs are drawn by [facility]. Please bring your co-pay at the time of your lab draw.  You may receive a bill from [place] for your lab work.  Please note if you are on Hydroxychloroquine and and an order has been placed for a Hydroxychloroquine level, you will need to have it drawn 4 hours or more after your last dose.  If you wish to have your labs drawn at another location, please call the office 24 hours in advance to send orders.  If you have any questions regarding directions or hours of operation,  please call [phone].   As a reminder, please drink plenty of water prior to coming for your lab work. Thanks!  Shoulder Exercises Ask your health care provider which exercises are safe for you. Do exercises exactly as told by your health care provider and adjust them as directed. It is normal to feel mild stretching,  pulling, tightness, or discomfort as you do these exercises. Stop right away if you feel sudden pain or your pain gets worse. Do not begin these exercises until told by your health care provider. Stretching exercises External rotation and abduction This exercise is sometimes called corner stretch. This exercise rotates your arm outward (external rotation) and moves your arm out from your body (abduction). Stand in a doorway with one of your feet slightly in front of the other. This is called a staggered stance. If you cannot reach your forearms to the door frame, stand facing a corner of a room. Choose one of the following positions as told by your health care provider: Place your hands and forearms on the door frame above your head. Place your hands and forearms on the door frame at the height of your head. Place your hands on the door frame at the height of your elbows. Slowly move your weight onto your front foot until you feel a stretch across your chest and in the front of your shoulders. Keep your head and chest upright and keep your abdominal muscles tight. Hold for __________ seconds. To release the stretch, shift your weight to your back foot. Repeat __________ times. Complete this exercise __________ times a day. Extension, standing Stand and hold a broomstick, a cane, or a similar object behind your back. Your hands should be a little wider than shoulder width apart. Your palms should face away from your back. Keeping your elbows straight and  your shoulder muscles relaxed, move the stick away from your body until you feel a stretch in your shoulders (extension). Avoid shrugging your shoulders while you move the stick. Keep your shoulder blades tucked down toward the middle of your back. Hold for __________ seconds. Slowly return to the starting position. Repeat __________ times. Complete this exercise __________ times a day. Range-of-motion exercises Pendulum  Stand near a wall or  a surface that you can hold onto for balance. Bend at the waist and let your left / right arm hang straight down. Use your other arm to support you. Keep your back straight and do not lock your knees. Relax your left / right arm and shoulder muscles, and move your hips and your trunk so your left / right arm swings freely. Your arm should swing because of the motion of your body, not because you are using your arm or shoulder muscles. Keep moving your hips and trunk so your arm swings in the following directions, as told by your health care provider: Side to side. Forward and backward. In clockwise and counterclockwise circles. Continue each motion for __________ seconds, or for as long as told by your health care provider. Slowly return to the starting position. Repeat __________ times. Complete this exercise __________ times a day. Shoulder flexion, standing  Stand and hold a broomstick, a cane, or a similar object. Place your hands a little more than shoulder width apart on the object. Your left / right hand should be palm up, and your other hand should be palm down. Keep your elbow straight and your shoulder muscles relaxed. Push the stick up with your healthy arm to raise your left / right arm in front of your body, and then over your head until you feel a stretch in your shoulder (flexion). Avoid shrugging your shoulder while you raise your arm. Keep your shoulder blade tucked down toward the middle of your back. Hold for __________ seconds. Slowly return to the starting position. Repeat __________ times. Complete this exercise __________ times a day. Shoulder abduction, standing Stand and hold a broomstick, a cane, or a similar object. Place your hands a little more than shoulder width apart on the object. Your left / right hand should be palm up, and your other hand should be palm down. Keep your elbow straight and your shoulder muscles relaxed. Push the object across your body toward  your left / right side. Raise your left / right arm to the side of your body (abduction) until you feel a stretch in your shoulder. Do not raise your arm above shoulder height unless your health care provider tells you to do that. If directed, raise your arm over your head. Avoid shrugging your shoulder while you raise your arm. Keep your shoulder blade tucked down toward the middle of your back. Hold for __________ seconds. Slowly return to the starting position. Repeat __________ times. Complete this exercise __________ times a day. Internal rotation  Place your left / right hand behind your back, palm up. Use your other hand to dangle an exercise band, a towel, or a similar object over your shoulder. Grasp the band with your left / right hand so you are holding on to both ends. Gently pull up on the band until you feel a stretch in the front of your left / right shoulder. The movement of your arm toward the center of your body is called internal rotation. Avoid shrugging your shoulder while you raise your arm. Keep your shoulder  blade tucked down toward the middle of your back. Hold for __________ seconds. Release the stretch by letting go of the band and lowering your hands. Repeat __________ times. Complete this exercise __________ times a day. Strengthening exercises External rotation  Sit in a stable chair without armrests. Secure an exercise band to a stable object at elbow height on your left / right side. Place a soft object, such as a folded towel or a small pillow, between your left / right upper arm and your body to move your elbow about 4 inches (10 cm) away from your side. Hold the end of the exercise band so it is tight and there is no slack. Keeping your elbow pressed against the soft object, slowly move your forearm out, away from your abdomen (external rotation). Keep your body steady so only your forearm moves. Hold for __________ seconds. Slowly return to the starting  position. Repeat __________ times. Complete this exercise __________ times a day. Shoulder abduction  Sit in a stable chair without armrests, or stand up. Hold a __________ weight in your left / right hand, or hold an exercise band with both hands. Start with your arms straight down and your left / right palm facing in, toward your body. Slowly lift your left / right hand out to your side (abduction). Do not lift your hand above shoulder height unless your health care provider tells you that this is safe. Keep your arms straight. Avoid shrugging your shoulder while you do this movement. Keep your shoulder blade tucked down toward the middle of your back. Hold for __________ seconds. Slowly lower your arm, and return to the starting position. Repeat __________ times. Complete this exercise __________ times a day. Shoulder extension Sit in a stable chair without armrests, or stand up. Secure an exercise band to a stable object in front of you so it is at shoulder height. Hold one end of the exercise band in each hand. Your palms should face each other. Straighten your elbows and lift your hands up to shoulder height. Step back, away from the secured end of the exercise band, until the band is tight and there is no slack. Squeeze your shoulder blades together as you pull your hands down to the sides of your thighs (extension). Stop when your hands are straight down by your sides. Do not let your hands go behind your body. Hold for __________ seconds. Slowly return to the starting position. Repeat __________ times. Complete this exercise __________ times a day. Shoulder row Sit in a stable chair without armrests, or stand up. Secure an exercise band to a stable object in front of you so it is at waist height. Hold one end of the exercise band in each hand. Position your palms so that your thumbs are facing the ceiling (neutral position). Bend each of your elbows to a 90-degree angle (right  angle) and keep your upper arms at your sides. Step back until the band is tight and there is no slack. Slowly pull your elbows back behind you. Hold for __________ seconds. Slowly return to the starting position. Repeat __________ times. Complete this exercise __________ times a day. Shoulder press-ups  Sit in a stable chair that has armrests. Sit upright, with your feet flat on the floor. Put your hands on the armrests so your elbows are bent and your fingers are pointing forward. Your hands should be about even with the sides of your body. Push down on the armrests and use your arms to  lift yourself off the chair. Straighten your elbows and lift yourself up as much as you comfortably can. Move your shoulder blades down, and avoid letting your shoulders move up toward your ears. Keep your feet on the ground. As you get stronger, your feet should support less of your body weight as you lift yourself up. Hold for __________ seconds. Slowly lower yourself back into the chair. Repeat __________ times. Complete this exercise __________ times a day. Wall push-ups  Stand so you are facing a stable wall. Your feet should be about one arm-length away from the wall. Lean forward and place your palms on the wall at shoulder height. Keep your feet flat on the floor as you bend your elbows and lean forward toward the wall. Hold for __________ seconds. Straighten your elbows to push yourself back to the starting position. Repeat __________ times. Complete this exercise __________ times a day. This information is not intended to replace advice given to you by your health care provider. Make sure you discuss any questions you have with your health care provider. Document Revised: 12/21/2018 Document Reviewed: 09/28/2018 Elsevier Patient Education  2022 [employer] Band Syndrome Rehab Ask your health care provider which exercises are safe for you. Do exercises exactly as told by your  health care provider and adjust them as directed. It is normal to feel mild stretching, pulling, tightness, or discomfort as you do these exercises. Stop right away if you feel sudden pain or your pain gets significantly worse. Do not begin these exercises until told by your health care provider. Stretching and range-of-motion exercises These exercises warm up your muscles and joints and improve the movement and flexibility of your hip and pelvis. Quadriceps stretch, prone  Lie on your abdomen (prone position) on a firm surface, such as a bed or padded floor. Bend your left / right knee and reach back to hold your ankle or pant leg. If you cannot reach your ankle or pant leg, loop a belt around your foot and grab the belt instead. Gently pull your heel toward your buttocks. Your knee should not slide out to the side. You should feel a stretch in the front of your thigh and knee (quadriceps). Hold this position for __________ seconds. Repeat __________ times. Complete this exercise __________ times a day. Iliotibial band stretch An iliotibial band is a strong band of muscle tissue that runs from the outer side of your hip to the outer side of your thigh and knee. Lie on your side with your left / right leg in the top position. Bend both of your knees and grab your left / right ankle. Stretch out your bottom arm to help you balance. Slowly bring your top knee back so your thigh goes behind your trunk. Slowly lower your top leg toward the floor until you feel a gentle stretch on the outside of your left / right hip and thigh. If you do not feel a stretch and your knee will not fall farther, place the heel of your other foot on top of your knee and pull your knee down toward the floor with your foot. Hold this position for __________ seconds. Repeat __________ times. Complete this exercise __________ times a day. Strengthening exercises These exercises build strength and endurance in your hip and  pelvis. Endurance is the ability to use your muscles for a long time, even after they get tired. Straight leg raises, side-lying This exercise strengthens the muscles that rotate the leg at the hip  and move it away from your body (hip abductors). Lie on your side with your left / right leg in the top position. Lie so your head, shoulder, hip, and knee line up. You may bend your bottom knee to help you balance. Roll your hips slightly forward so your hips are stacked directly over each other and your left / right knee is facing forward. Tense the muscles in your outer thigh and lift your top leg 4-6 inches (10-15 cm). Hold this position for __________ seconds. Slowly lower your leg to return to the starting position. Let your muscles relax completely before doing another repetition. Repeat __________ times. Complete this exercise __________ times a day. Leg raises, prone This exercise strengthens the muscles that move the hips backward (hip extensors). Lie on your abdomen (prone position) on your bed or a firm surface. You can put a pillow under your hips if that is more comfortable for your lower back. Bend your left / right knee so your foot is straight up in the air. Squeeze your buttocks muscles and lift your left / right thigh off the bed. Do not let your back arch. Tense your thigh muscle as hard as you can without increasing any knee pain. Hold this position for __________ seconds. Slowly lower your leg to return to the starting position and allow it to relax completely. Repeat __________ times. Complete this exercise __________ times a day. Hip hike Stand sideways on a bottom step. Stand on your left / right leg with your other foot unsupported next to the step. You can hold on to a railing or wall for balance if needed. Keep your knees straight and your torso square. Then lift your left / right hip up toward the ceiling. Slowly let your left / right hip lower toward the floor, past the  starting position. Your foot should get closer to the floor. Do not lean or bend your knees. Repeat __________ times. Complete this exercise __________ times a day. This information is not intended to replace advice given to you by your health care provider. Make sure you discuss any questions you have with your health care provider. Document Revised: 11/06/2019 Document Reviewed: 11/06/2019 Elsevier Patient Education  2022 Melissa are taking a medication(s) that can suppress your immune system.  The following immunizations are recommended: Flu annually Covid-19  Td/Tdap (tetanus, diphtheria, pertussis) every 10 years Pneumonia (Prevnar 15 then Pneumovax 23 at least 1 year apart.  Alternatively, can take Prevnar 20 without needing additional dose) Shingrix: 2 doses from 4 weeks to 6 months apart  Please check with your PCP to make sure you are up to date.

## 2021-09-29 NOTE — Patient Instructions (Addendum)
Hi Falan,  It was great to get to meet you over the telephone! Below is a summary of some of the topics we discussed.   Don't forget to switch out your calcium to calcium citrate once you finish out what you have so you can get more out of it.  Please reach out to me if you have any questions or need anything before our follow up!  Best, Maddie  [name], PharmD, [facility]   Visit Information   Goals Addressed   None    Patient Care Plan: [facility]CM Pharmacy Care Plan     Problem Identified: Problem: Hypertension, Hyperlipidemia, GERD, Asthma, Hypothyroidism, Osteopenia, and Rheumatoid arthritis      Long-Range Goal: Patient-Specific Goal   Start Date: 09/28/2021  Expected End Date: 09/28/2022  This Visit's Progress: On track  Priority: High  Note:   Current Barriers:  Unable to achieve control of hypothyroidism   Pharmacist Clinical Goal(s):  Patient will achieve adherence to monitoring guidelines and medication adherence to achieve therapeutic efficacy achieve control of hypothyroidism as evidenced by TSH  through collaboration with PharmD and provider.   Interventions: 1:1 collaboration with [place], Betty G, MD regarding development and update of comprehensive plan of care as evidenced by provider attestation and co-signature Inter-disciplinary care team collaboration (see longitudinal plan of care) Comprehensive medication review performed; medication list updated in electronic medical record  Hypertension (BP goal <130/80) -Controlled -Current treatment: Amlodipine-benazepril 5-40 mg daily  - Appropriate, Effective, Safe, Accessible HCTZ 12.5 mg daily - Appropriate, Effective, Safe, Accessible -Medications previously tried: none  -Current home readings: 108/65-120/70 (checking once a month)  -Current dietary habits: pays attention to sodium intake -Current exercise habits: water aerobics 3 times a  week; arthritis class twice a week -Denies hypotensive/hypertensive symptoms -Educated on BP goals and benefits of medications for prevention of heart attack, stroke and kidney damage; Importance of home blood pressure monitoring; Proper BP monitoring technique; -Counseled to monitor BP at home weekly, document, and provide log at future appointments -Counseled on diet and exercise extensively Recommended to continue current medication  Hyperlipidemia: (LDL goal < 100) -Controlled -Current treatment: Pravastatin 20 mg 1 tablet daily -Medications previously tried: none  -Current dietary patterns: did not discuss -Current exercise habits: water aerobics and arthritis class -Educated on Cholesterol goals;  Benefits of statin for ASCVD risk reduction; -Counseled on diet and exercise extensively Recommended to continue current medication  Asthma (Goal: control symptoms) -Controlled -Current treatment  Symbicort 160-4.5 mcg/act 2 puffs twice daily - Appropriate, Effective, Safe, Accessible  -Medications previously tried: Airduo, Firefighter (cost)  -Pulmonary function testing: unknown -Patient reports consistent use of maintenance inhaler -Frequency of rescue inhaler use: never (does not own one) -Counseled on Proper inhaler technique; Benefits of consistent maintenance inhaler use When to use rescue inhaler -Recommended to continue current medication Recommended using Symbicort as a rescue inhaler as needed.  Depression/Anxiety (Goal: minimize symptoms) -Controlled -Current treatment: Escitalopram 10 mg 1 tablet daily  -Medications previously tried/failed: none -PHQ9: 0 -GAD7: n/a -Educated on Benefits of medication for symptom control Benefits of cognitive-behavioral therapy with or without medication -Recommended to continue current medication  Osteopenia (Goal prevent fractures) -Not ideally controlled -Last DEXA Scan: 02/04/20              T-Score femoral neck: -0.9              T-Score total hip: -0.7  T-Score lumbar spine: -1.1             T-Score forearm radius: n/a             10-year probability of major osteoporotic fracture: 9.5%             10-year probability of hip fracture: 0.7% -Patient is not a candidate for pharmacologic treatment -Current treatment  Vitamin D3 2000 units daily Calcium carbonate 600 mg & vitamin D once daily  -Medications previously tried: none  -Recommend [phone] units of vitamin D daily. Recommend 1200 mg of calcium daily from dietary and supplemental sources. Recommend weight-bearing and muscle strengthening exercises for building and maintaining bone density. -Recommended switching to calcium citrate once she has finished her current supply for better absorption.  Hypothyroidism (Goal: 2.5-4.5 given concomitant osteopenia) -Uncontrolled -Current treatment  Levothyroxine 150 mcg 1 tablet Mon, Wed, Fri, Sat and Sun -Medications previously tried: none  -Recommended rechecking TSH and decreasing dose depending on result if TSH not optimal for osteopenia/osteoporosis.  Rheumatoid arthritis (Goal: minimize symptoms) -Controlled -Current treatment  Hydroxychloroquine 200 mg 1 tablet twice daily  -Medications previously tried: none  -Recommended to continue current medication   Health Maintenance -Vaccine gaps: none -Current therapy:  Estradiol 1% vaginal cream  Voltaren 1% gel (uses 1x weekly) Flonase 50 mcg/spray PRN (using 2 sprays daily)  -Educated on Cost vs benefit of each product must be carefully weighed by individual consumer -Patient is satisfied with current therapy and denies issues -Recommended to continue current medication  Patient Goals/Self-Care Activities Patient will:  - check blood pressure weekly, document, and provide at future appointments target a minimum of 150 minutes of moderate intensity exercise weekly  Follow Up Plan: Telephone follow up appointment with care management team  member scheduled for: 1 year       Patient verbalizes understanding of instructions and care plan provided today and agrees to view in [place]. Active MyChart status confirmed with patient.   Telephone follow up appointment with pharmacy team member scheduled for: 1 year  Viona Gilmore, [facility]

## 2021-09-30 ENCOUNTER — Encounter: Payer: Self-pay | Admitting: Family Medicine

## 2021-09-30 LAB — CBC WITH DIFFERENTIAL/PLATELET
Absolute Monocytes: 624 cells/uL (ref 200–950)
Basophils Absolute: 52 cells/uL (ref 0–200)
Basophils Relative: 0.8 %
Eosinophils Absolute: 143 cells/uL (ref 15–500)
Eosinophils Relative: 2.2 %
HCT: 43.4 % (ref 35.0–45.0)
Hemoglobin: 14.5 g/dL (ref 11.7–15.5)
Lymphs Abs: 1677 cells/uL (ref 850–3900)
MCH: 32.9 pg (ref 27.0–33.0)
MCHC: 33.4 g/dL (ref 32.0–36.0)
MCV: 98.4 fL (ref 80.0–100.0)
MPV: 9.6 fL (ref 7.5–12.5)
Monocytes Relative: 9.6 %
Neutro Abs: 4004 cells/uL (ref 1500–7800)
Neutrophils Relative %: 61.6 %
Platelets: 197 10*3/uL (ref 140–400)
RBC: 4.41 10*6/uL (ref 3.80–5.10)
RDW: 12.5 % (ref 11.0–15.0)
Total Lymphocyte: 25.8 %
WBC: 6.5 10*3/uL (ref 3.8–10.8)

## 2021-09-30 LAB — COMPLETE METABOLIC PANEL WITH GFR
AG Ratio: 1.7 (calc) (ref 1.0–2.5)
ALT: 17 U/L (ref 6–29)
AST: 24 U/L (ref 10–35)
Albumin: 4.2 g/dL (ref 3.6–5.1)
Alkaline phosphatase (APISO): 54 U/L (ref 37–153)
BUN: 19 mg/dL (ref 7–25)
CO2: 33 mmol/L — ABNORMAL HIGH (ref 20–32)
Calcium: 9.3 mg/dL (ref 8.6–10.4)
Chloride: 96 mmol/L — ABNORMAL LOW (ref 98–110)
Creat: 0.73 mg/dL (ref 0.50–1.05)
Globulin: 2.5 g/dL (calc) (ref 1.9–3.7)
Glucose, Bld: 103 mg/dL — ABNORMAL HIGH (ref 65–99)
Potassium: 3.8 mmol/L (ref 3.5–5.3)
Sodium: 135 mmol/L (ref 135–146)
Total Bilirubin: 0.7 mg/dL (ref 0.2–1.2)
Total Protein: 6.7 g/dL (ref 6.1–8.1)
eGFR: 90 mL/min/{1.73_m2} (ref >=60)

## 2021-09-30 LAB — TSH: TSH: 0.28 mIU/L — ABNORMAL LOW (ref 0.40–4.50)

## 2021-09-30 NOTE — Progress Notes (Signed)
CBC and CMP normal.  TSH is low and stable.

## 2021-10-04 ENCOUNTER — Ambulatory Visit (INDEPENDENT_AMBULATORY_CARE_PROVIDER_SITE_OTHER): Payer: Medicare Other

## 2021-10-04 VITALS — Ht 67.0 in | Wt 210.0 lb

## 2021-10-04 DIAGNOSIS — Z0000 Encounter for general adult medical examination without abnormal findings: Secondary | ICD-10-CM | POA: Diagnosis not present

## 2021-10-04 NOTE — Patient Instructions (Addendum)
Mercedes Hodge , Thank you for taking time to come for your Medicare Wellness Visit. I appreciate your ongoing commitment to your health goals. Please review the following plan we discussed and let me know if I can assist you in the future.   These are the goals we discussed:  Goals      Patient Stated     Patient will continue to do water exercises.        This is a list of the screening recommended for you and due dates:  Health Maintenance  Topic Date Due   Mammogram  09/24/2022   Colon Cancer Screening  12/18/2023   Tetanus Vaccine  06/06/2029   Pneumonia Vaccine  Completed   Flu Shot  Completed   DEXA scan (bone density measurement)  Completed   COVID-19 Vaccine  Completed   Hepatitis C Screening: USPSTF Recommendation to screen - Ages 61-79 yo.  Completed   Zoster (Shingles) Vaccine  Completed   HPV Vaccine  Aged Out   Advanced directives: Yes  Conditions/risks identified: None  Next appointment: Follow up in one year for your annual wellness visit    Preventive Care 65 Years and Older, Female Preventive care refers to lifestyle choices and visits with your health care provider that can promote health and wellness. What does preventive care include? A yearly physical exam. This is also called an annual well check. Dental exams once or twice a year. Routine eye exams. Ask your health care provider how often you should have your eyes checked. Personal lifestyle choices, including: Daily care of your teeth and gums. Regular physical activity. Eating a healthy diet. Avoiding tobacco and drug use. Limiting alcohol use. Practicing safe sex. Taking low-dose aspirin every day. Taking vitamin and mineral supplements as recommended by your health care provider. What happens during an annual well check? The services and screenings done by your health care provider during your annual well check will depend on your age, overall health, lifestyle risk factors, and family  history of disease. Counseling  Your health care provider may ask you questions about your: Alcohol use. Tobacco use. Drug use. Emotional well-being. Home and relationship well-being. Sexual activity. Eating habits. History of falls. Memory and ability to understand (cognition). Work and work Statistician. Reproductive health. Screening  You may have the following tests or measurements: Height, weight, and BMI. Blood pressure. Lipid and cholesterol levels. These may be checked every 5 years, or more frequently if you are over 37 years old. Skin check. Lung cancer screening. You may have this screening every year starting at age 51 if you have a 30-pack-year history of smoking and currently smoke or have quit within the past 15 years. Fecal occult blood test (FOBT) of the stool. You may have this test every year starting at age 90. Flexible sigmoidoscopy or colonoscopy. You may have a sigmoidoscopy every 5 years or a colonoscopy every 10 years starting at age 38. Hepatitis C blood test. Hepatitis B blood test. Sexually transmitted disease (STD) testing. Diabetes screening. This is done by checking your blood sugar (glucose) after you have not eaten for a while (fasting). You may have this done every 1-3 years. Bone density scan. This is done to screen for osteoporosis. You may have this done starting at age 74. Mammogram. This may be done every 1-2 years. Talk to your health care provider about how often you should have regular mammograms. Talk with your health care provider about your test results, treatment options, and if necessary,  the need for more tests. Vaccines  Your health care provider may recommend certain vaccines, such as: Influenza vaccine. This is recommended every year. Tetanus, diphtheria, and acellular pertussis (Tdap, Td) vaccine. You may need a Td booster every 10 years. Zoster vaccine. You may need this after age 6. Pneumococcal 13-valent conjugate (PCV13)  vaccine. One dose is recommended after age 68. Pneumococcal polysaccharide (PPSV23) vaccine. One dose is recommended after age 80. Talk to your health care provider about which screenings and vaccines you need and how often you need them. This information is not intended to replace advice given to you by your health care provider. Make sure you discuss any questions you have with your health care provider. Document Released: 09/25/2015 Document Revised: 05/18/2016 Document Reviewed: 06/30/2015 Elsevier Interactive Patient Education  2017 [place] Prevention in the Home Falls can cause injuries. They can happen to people of all ages. There are many things you can do to make your home safe and to help prevent falls. What can I do on the outside of my home? Regularly fix the edges of walkways and driveways and fix any cracks. Remove anything that might make you trip as you walk through a door, such as a raised step or threshold. Trim any bushes or trees on the path to your home. Use bright outdoor lighting. Clear any walking paths of anything that might make someone trip, such as rocks or tools. Regularly check to see if handrails are loose or broken. Make sure that both sides of any steps have handrails. Any raised decks and porches should have guardrails on the edges. Have any leaves, snow, or ice cleared regularly. Use sand or salt on walking paths during winter. Clean up any spills in your garage right away. This includes oil or grease spills. What can I do in the bathroom? Use night lights. Install grab bars by the toilet and in the tub and shower. Do not use towel bars as grab bars. Use non-skid mats or decals in the tub or shower. If you need to sit down in the shower, use a plastic, non-slip stool. Keep the floor dry. Clean up any water that spills on the floor as soon as it happens. Remove soap buildup in the tub or shower regularly. Attach bath mats securely with  double-sided non-slip rug tape. Do not have throw rugs and other things on the floor that can make you trip. What can I do in the bedroom? Use night lights. Make sure that you have a light by your bed that is easy to reach. Do not use any sheets or blankets that are too big for your bed. They should not hang down onto the floor. Have a firm chair that has side arms. You can use this for support while you get dressed. Do not have throw rugs and other things on the floor that can make you trip. What can I do in the kitchen? Clean up any spills right away. Avoid walking on wet floors. Keep items that you use a lot in easy-to-reach places. If you need to reach something above you, use a strong step stool that has a grab bar. Keep electrical cords out of the way. Do not use floor polish or wax that makes floors slippery. If you must use wax, use non-skid floor wax. Do not have throw rugs and other things on the floor that can make you trip. What can I do with my stairs? Do not leave any items on  the stairs. Make sure that there are handrails on both sides of the stairs and use them. Fix handrails that are broken or loose. Make sure that handrails are as long as the stairways. Check any carpeting to make sure that it is firmly attached to the stairs. Fix any carpet that is loose or worn. Avoid having throw rugs at the top or bottom of the stairs. If you do have throw rugs, attach them to the floor with carpet tape. Make sure that you have a light switch at the top of the stairs and the bottom of the stairs. If you do not have them, ask someone to add them for you. What else can I do to help prevent falls? Wear shoes that: Do not have high heels. Have rubber bottoms. Are comfortable and fit you well. Are closed at the toe. Do not wear sandals. If you use a stepladder: Make sure that it is fully opened. Do not climb a closed stepladder. Make sure that both sides of the stepladder are locked  into place. Ask someone to hold it for you, if possible. Clearly mark and make sure that you can see: Any grab bars or handrails. First and last steps. Where the edge of each step is. Use tools that help you move around (mobility aids) if they are needed. These include: Canes. Walkers. Scooters. Crutches. Turn on the lights when you go into a dark area. Replace any light bulbs as soon as they burn out. Set up your furniture so you have a clear path. Avoid moving your furniture around. If any of your floors are uneven, fix them. If there are any pets around you, be aware of where they are. Review your medicines with your doctor. Some medicines can make you feel dizzy. This can increase your chance of falling. Ask your doctor what other things that you can do to help prevent falls. This information is not intended to replace advice given to you by your health care provider. Make sure you discuss any questions you have with your health care provider. Document Released: 06/25/2009 Document Revised: 02/04/2016 Document Reviewed: 10/03/2014 Elsevier Interactive Patient Education  2017 [employer].

## 2021-10-04 NOTE — Progress Notes (Signed)
Subjective:   Mercedes Hodge is a 66 y.o. female who presents for Medicare Annual (Subsequent) preventive examination.  Review of Systems    No ROS Cardiac Risk Factors include: advanced age (>58men, >59 women);hypertension    Objective:    Today's Vitals   10/04/21 1435  Weight: 210 lb (95.3 kg)  Height: 5\' 7"  (1.702 m)   Body mass index is 32.89 kg/m.  Advanced Directives 10/04/2021 10/19/2020 09/17/2020  Does Patient Have a Medical Advance Directive? Yes Yes Yes  Type of Paramedic of [place];Living will Living will;Healthcare Power of Attorney -  Does patient want to make changes to medical advance directive? No - Patient declined No - Patient declined No - Patient declined  Copy of [place] in Chart? No - copy requested No - copy requested -    Current Medications (verified) Outpatient Encounter Medications as of 10/04/2021  Medication Sig   amLODipine-benazepril (LOTREL) 5-40 MG capsule Take 1 capsule by mouth daily.   calcium carbonate (OS-CAL) 600 MG tablet Take 600 mg by mouth daily.   Cholecalciferol (VITAMIN D) 50 MCG (2000 UT) tablet Take 2,000 Units by mouth daily.   diclofenac sodium (VOLTAREN) 1 % GEL Apply topically 4 (four) times daily.   escitalopram (LEXAPRO) 10 MG tablet TAKE 1 TABLET BY MOUTH  DAILY   fluticasone (FLONASE) 50 MCG/ACT nasal spray Place into both nostrils as needed for allergies or rhinitis.   hydrochlorothiazide (HYDRODIURIL) 12.5 MG tablet Take 1 tablet (12.5 mg total) by mouth daily.   hydroxychloroquine (PLAQUENIL) 200 MG tablet Take 1 tablet (200 mg total) by mouth 2 (two) times daily.   levothyroxine (SYNTHROID) 150 MCG tablet 1 tab Monday,Wed,Friday,Sat,and Sun. (Patient taking differently: Take 150 mcg by mouth daily. 1 tab Monday,Wed,Friday,Sat,and Sun.)   pramoxine-hydrocortisone (PROCTOCREAM-HC) 1-1 % rectal cream Place 1 application rectally 2 (two) times daily as needed for hemorrhoids  or anal itching.   pravastatin (PRAVACHOL) 20 MG tablet Take 1 tablet (20 mg total) by mouth daily.   SYMBICORT 160-4.5 MCG/ACT inhaler USE 2 INHALATIONS BY MOUTH  TWICE DAILY   No facility-administered encounter medications on file as of 10/04/2021.    Allergies (verified) Lovenox [enoxaparin sodium]   History: Past Medical History:  Diagnosis Date   Allergy    Arthritis    Asthma    GERD (gastroesophageal reflux disease)    Hyperlipidemia    Hypertension    Thyroid cancer ([place])    thyroid   Thyroid disease    Postsurgical hypothyroidism   Past Surgical History:  Procedure Laterality Date   [place]   EXTRACORPOREAL SHOCK WAVE LITHOTRIPSY Left 09/17/2020   Procedure: EXTRACORPOREAL SHOCK WAVE LITHOTRIPSY (ESWL);  Surgeon: Franchot Gallo, MD;  Location: [facility];  Service: Urology;  Laterality: Left;   LAPAROSCOPIC GASTRIC SLEEVE RESECTION  07/2018   left knee replacement  2008   right ankle replacement  2012   right knee replacement  2009   trimallar FX  1995   right ankle   Family History  Problem Relation Age of Onset   COPD Mother    Heart disease Mother    Cancer Father        thyroid cancer   Juvenile Rhematoid Arthritis Sister    Stroke Sister    Thyroid cancer Brother    Thyroid cancer Daughter    Healthy Daughter    Healthy Son    Healthy Daughter  Colon cancer Neg Hx    Esophageal cancer Neg Hx    Pancreatic cancer Neg Hx    Stomach cancer Neg Hx    Liver disease Neg Hx    Social History   Socioeconomic History   Marital status: Married    Spouse name: Not on file   Number of children: Not on file   Years of education: Not on file   Highest education level: Not on file  Occupational History   Not on file  Tobacco Use   Smoking status: Never   Smokeless tobacco: Never  Vaping Use   Vaping Use: Never used  Substance and Sexual Activity   Alcohol use: Yes    Comment: occ  wine    Drug use: Never   Sexual activity: Yes    Birth control/protection: None  Other Topics Concern   Not on file  Social History Narrative   Not on file   Social Determinants of Health   Financial Resource Strain: Low Risk    Difficulty of Paying Living Expenses: Not hard at all  Food Insecurity: No Food Insecurity   Worried About Charity fundraiser in the Last Year: Never true   Ran Out of Food in the Last Year: Never true  Transportation Needs: No Transportation Needs   Lack of Transportation (Medical): No   Lack of Transportation (Non-Medical): No  Physical Activity: Sufficiently Active   Days of Exercise per Week: 3 days   Minutes of Exercise per Session: 120 min  Stress: No Stress Concern Present   Feeling of Stress : Not at all  Social Connections: Moderately Integrated   Frequency of Communication with Friends and Family: More than three times a week   Frequency of Social Gatherings with Friends and Family: More than three times a week   Attends Religious Services: Never   Marine scientist or Organizations: Yes   Attends Music therapist: More than 4 times per year   Marital Status: Married    Tobacco Counseling Counseling given: Not Answered   Clinical Intake: How often do you need to have someone help you when you read instructions, pamphlets, or other written materials from your doctor or pharmacy?: 1 - Never  Diabetic? No  Interpreter Needed?: NoActivities of Daily Living In your present state of health, do you have any difficulty performing the following activities: 10/04/2021 10/19/2020  Hearing? N N  Vision? N N  Difficulty concentrating or making decisions? - N  Walking or climbing stairs? N N  Dressing or bathing? N N  Doing errands, shopping? N N  Preparing Food and eating ? N N  Using the Toilet? N N  In the past six months, have you accidently leaked urine? N N  Do you have problems with loss of bowel control? N N  Managing  your Medications? N N  Managing your Finances? N N  Housekeeping or managing your Housekeeping? N N  Some recent data might be hidden    Patient Care Team: [place], Betty G, MD as PCP - General (Family Medicine) Viona Gilmore, [facility] as Pharmacist (Pharmacist)  Indicate any recent Medical Services you may have received from other than [facility] providers in the past year (date may be approximate).     Assessment:   This is a routine wellness examination for [employer]. Virtual Visit via Telephone Note  I connected with  Mercedes Hodge on 10/04/21 at  2:30 PM EST by telephone and verified that I  am speaking with the correct person using two identifiers.  Location: Patient: Home Provider: Office Persons participating in the virtual visit: patient/Nurse Health Advisor   I discussed the limitations, risks, security and privacy concerns of performing an evaluation and management service by telephone and the availability of in person appointments. The patient expressed understanding and agreed to proceed.  Interactive audio and video telecommunications were attempted between this nurse and patient, however failed, due to patient having technical difficulties OR patient did not have access to video capability.  We continued and completed visit with audio only.  Some vital signs may be absent or patient reported.   Criselda Peaches, LPN   Hearing/Vision screen Hearing Screening - Comments:: No difficulty hearing Vision Screening - Comments:: Wears reading glasses. Followed by Dr Katy Fitch  Dietary issues and exercise activities discussed: Current Exercise Habits: Home exercise routine, Type of exercise: walking, Time (Minutes): > 60, Frequency (Times/Week): 3, Weekly Exercise (Minutes/Week): 0, Intensity: Moderate   Goals Addressed             This Visit's Progress    Patient Stated       Patient will continue to do water exercises.       Depression Screen PHQ 2/9 Scores 10/04/2021  10/19/2020 08/23/2020 09/11/2019  PHQ - 2 Score 0 0 0 0  PHQ- 9 Score - - 0 0    Fall Risk Fall Risk  10/04/2021 10/19/2020 02/17/2020 02/17/2020  Falls in the past year? 0 0 1 1  Number falls in past yr: 0 0 0 0  Injury with Fall? 0 0 1 1  Comment - - - broken rib  Risk for fall due to : - No Fall Risks History of fall(s) -  Follow up - Falls evaluation completed;Falls prevention discussed Education provided -    FALL RISK PREVENTION PERTAINING TO THE HOME:  Any stairs in or around the home? Yes  If so, are there any without handrails? No  Home free of loose throw rugs in walkways, pet beds, electrical cords, etc? Yes  Adequate lighting in your home to reduce risk of falls? Yes   ASSISTIVE DEVICES UTILIZED TO PREVENT FALLS:  Life alert? No  Use of a cane, walker or w/c? No  Grab bars in the bathroom? No  Shower chair or bench in shower? Yes  Elevated toilet seat or a handicapped toilet? No   TIMED UP AND GO:  Was the test performed? No . Audio Visit  Cognitive Function:   6CIT Screen 10/04/2021  What Year? 0 points  What month? 0 points  What time? 0 points  Count back from 20 0 points  Months in reverse 0 points  Repeat phrase 0 points  Total Score 0    Immunizations Immunization History  Administered Date(s) Administered   Fluad Quad(high Dose 65+) 06/29/2021   Influenza-Unspecified 05/22/2019, 06/10/2020   PFIZER Comirnaty(Gray Top)Covid-19 Tri-Sucrose Vaccine 05/08/2021   PFIZER(Purple Top)SARS-COV-2 Vaccination 11/17/2019, 12/08/2019, 06/10/2020   Pneumococcal Conjugate-13 06/07/2019   Pneumococcal Polysaccharide-23 08/18/2020   Tdap 06/07/2019   Zoster Recombinat (Shingrix) 12/23/2020, 02/27/2021    Screening Tests Health Maintenance  Topic Date Due   MAMMOGRAM  09/24/2022   COLONOSCOPY (Pts 45-47yrs Insurance coverage will need to be confirmed)  12/18/2023   TETANUS/TDAP  06/06/2029   Pneumonia Vaccine 68+ Years old  Completed   INFLUENZA VACCINE   Completed   DEXA SCAN  Completed   COVID-19 Vaccine  Completed   Hepatitis C Screening  Completed  Zoster Vaccines- Shingrix  Completed   HPV VACCINES  Aged Out    Health Maintenance  There are no preventive care reminders to display for this patient.  Additional Screening:   Vision Screening: Recommended annual ophthalmology exams for early detection of glaucoma and other disorders of the eye. Is the patient up to date with their annual eye exam?  Yes  Who is the provider or what is the name of the office in which the patient attends annual eye exams? Followed by Dr Katy Fitch  Dental Screening: Recommended annual dental exams for proper oral hygiene  Community Resource Referral / Chronic Care Management:  CRR required this visit?  No   CCM required this visit?  No      Plan:     I have personally reviewed and noted the following in the patients chart:   Medical and social history Use of alcohol, tobacco or illicit drugs  Current medications and supplements including opioid prescriptions.  Functional ability and status Nutritional status Physical activity Advanced directives List of other physicians Hospitalizations, surgeries, and ER visits in previous 12 months Vitals Screenings to include cognitive, depression, and falls Referrals and appointments  In addition, I have reviewed and discussed with patient certain preventive protocols, quality metrics, and best practice recommendations. A written personalized care plan for preventive services as well as general preventive health recommendations were provided to patient.     Criselda Peaches, LPN   7/40/8144

## 2021-10-05 ENCOUNTER — Other Ambulatory Visit: Payer: Self-pay | Admitting: Family Medicine

## 2021-10-05 DIAGNOSIS — E890 Postprocedural hypothyroidism: Secondary | ICD-10-CM

## 2021-10-05 MED ORDER — LEVOTHYROXINE SODIUM 137 MCG PO TABS: 137.0000 ug | ORAL_TABLET | Freq: Every day | ORAL | 0 refills | Status: DC

## 2021-10-10 ENCOUNTER — Other Ambulatory Visit: Payer: Self-pay | Admitting: Physician Assistant

## 2021-10-12 DIAGNOSIS — M8580 Other specified disorders of bone density and structure, unspecified site: Secondary | ICD-10-CM

## 2021-10-12 DIAGNOSIS — I10 Essential (primary) hypertension: Secondary | ICD-10-CM | POA: Diagnosis not present

## 2021-11-02 ENCOUNTER — Other Ambulatory Visit: Payer: Self-pay | Admitting: Physician Assistant

## 2021-11-03 NOTE — Telephone Encounter (Signed)
Next Visit: 03/01/2022  Last Visit: 09/29/2021  Labs: 09/29/2021 CBC and CMP normal.  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

## 2021-12-14 ENCOUNTER — Other Ambulatory Visit: Payer: Self-pay | Admitting: Family Medicine

## 2021-12-14 DIAGNOSIS — E890 Postprocedural hypothyroidism: Secondary | ICD-10-CM

## 2021-12-15 ENCOUNTER — Other Ambulatory Visit (INDEPENDENT_AMBULATORY_CARE_PROVIDER_SITE_OTHER): Payer: Medicare Other

## 2021-12-15 DIAGNOSIS — E890 Postprocedural hypothyroidism: Secondary | ICD-10-CM | POA: Diagnosis not present

## 2021-12-15 LAB — TSH: TSH: 0.32 u[IU]/mL — ABNORMAL LOW (ref 0.35–5.50)

## 2021-12-28 ENCOUNTER — Encounter: Payer: Medicare Other | Admitting: Family Medicine

## 2021-12-29 NOTE — Progress Notes (Signed)
? ?HPI: ?[name] is a 66 y.o. female, who is here today for her routine physical and follow up. ? ?Last CPE: 08/18/20 ? ?Regular exercise: She goes to the [facility] 3 times per week 2 hours water exercises. She has noted wt loss. ?Following a healthful diet: Cooking at home, well balance diet, low card.  ? ?Chronic medical problems: Hypothyroidism, OA, asthma,GERD, hypertension, aortic atherosclerosis, dyslipidemia among some. ? ?Immunization History  ?Administered Date(s) Administered  ? Fluad Quad(high Dose 65+) 06/29/2021  ? Influenza-Unspecified 05/22/2019, 06/10/2020  ? PFIZER Comirnaty(Gray Top)Covid-19 Tri-Sucrose Vaccine 05/08/2021  ? PFIZER(Purple Top)SARS-COV-2 Vaccination 11/17/2019, 12/08/2019, 06/10/2020  ? Pneumococcal Conjugate-13 06/07/2019  ? Pneumococcal Polysaccharide-23 08/18/2020  ? Tdap 06/07/2019  ? Zoster Recombinat (Shingrix) 12/23/2020, 02/27/2021  ? ?Health Maintenance  ?Topic Date Due  ? INFLUENZA VACCINE  04/12/2022  ? MAMMOGRAM  09/24/2022  ? COLONOSCOPY (Pts 45-37yr Insurance coverage will need to be confirmed)  12/18/2023  ? TETANUS/TDAP  06/06/2029  ? Pneumonia Vaccine 68 Years old  Completed  ? DEXA SCAN  Completed  ? COVID-19 Vaccine  Completed  ? Hepatitis C Screening  Completed  ? Zoster Vaccines- Shingrix  Completed  ? HPV VACCINES  Aged Out  ? ?She has no concerns today. ? ?Hyperlipidemia and aortic atherosclerosis on pravastatin 20 mg daily. ?Lab Results  ?Component Value Date  ? CHOL 174 06/29/2021  ? HDL 79.40 06/29/2021  ? [id number] 06/29/2021  ? TRIG 85.0 06/29/2021  ? CHOLHDL 2 06/29/2021  ? ?Postablative-hypothyroidism: Currently she is on levothyroxine 137 mcg, she takes 1/2 tablet Tuesdays and Thursdays and the rest of the days 1 tablet. ?She has been taking biotin daily. ? ?Lab Results  ?Component Value Date  ? TSH 0.32 (L) 12/15/2021  ?Hypertension: Currently she is on HCTZ 12.5 mg daily and amlodipine-benazepril 5-40 mg daily. ?She monitors BP at home,  usually in the low 110s/70s. ? ?She has tolerated medications well, no side effects reported. ? ?Asthma on Symbicort 160-4.5 mcg twice daily as needed. ? ?Review of Systems  ?Constitutional:  Negative for appetite change and fever.  ?HENT:  Negative for hearing loss, mouth sores and sore throat.   ?Eyes:  Negative for redness and visual disturbance.  ?Respiratory:  Negative for cough, shortness of breath and wheezing.   ?Cardiovascular:  Negative for chest pain and leg swelling.  ?Gastrointestinal:  Negative for abdominal pain, nausea and vomiting.  ?     No changes in bowel habits.  ?Endocrine: Negative for cold intolerance, heat intolerance, polydipsia, polyphagia and polyuria.  ?Genitourinary:  Negative for decreased urine volume, dysuria, hematuria, vaginal bleeding and vaginal discharge.  ?Musculoskeletal:  Positive for arthralgias. Negative for gait problem.  ?Skin:  Negative for color change and rash.  ?Allergic/Immunologic: Positive for environmental allergies.  ?Neurological:  Negative for syncope, weakness and headaches.  ?Hematological:  Negative for adenopathy. Does not bruise/bleed easily.  ?Psychiatric/Behavioral:  Negative for confusion. The patient is not nervous/anxious.   ?All other systems reviewed and are negative. ? ?Current Outpatient Medications on File Prior to Visit  ?Medication Sig Dispense Refill  ? amLODipine-benazepril (LOTREL) 5-40 MG capsule Take 1 capsule by mouth daily. 90 capsule 2  ? calcium carbonate (OS-CAL) 600 MG tablet Take 600 mg by mouth daily.    ? Cholecalciferol (VITAMIN D) 50 MCG (2000 UT) tablet Take 2,000 Units by mouth daily.    ? diclofenac sodium (VOLTAREN) 1 % GEL Apply topically 4 (four) times daily.    ? escitalopram (LEXAPRO) 10  MG tablet TAKE 1 TABLET BY MOUTH  DAILY 90 tablet 3  ? fluticasone (FLONASE) 50 MCG/ACT nasal spray Place into both nostrils as needed for allergies or rhinitis.    ? hydrochlorothiazide (HYDRODIURIL) 12.5 MG tablet Take 1 tablet (12.5  mg total) by mouth daily. 90 tablet 2  ? hydroxychloroquine (PLAQUENIL) 200 MG tablet TAKE 1 TABLET BY MOUTH TWICE  DAILY 180 tablet 0  ? levothyroxine (SYNTHROID) 137 MCG tablet TAKE 1 TABLET BY MOUTH DAILY BEFORE BREAKFAST. (Patient taking differently: 1/2 tab on Tuesday & Thursday, 1 tab all other days.) 90 tablet 0  ? pramoxine-hydrocortisone (PROCTOCREAM-HC) 1-1 % rectal cream Place 1 application rectally 2 (two) times daily as needed for hemorrhoids or anal itching. 30 g 1  ? pravastatin (PRAVACHOL) 20 MG tablet Take 1 tablet (20 mg total) by mouth daily. 90 tablet 2  ? SYMBICORT 160-4.5 MCG/ACT inhaler USE 2 INHALATIONS BY MOUTH  TWICE DAILY 30.6 g 3  ? ?No current facility-administered medications on file prior to visit.  ? ?Past Medical History:  ?Diagnosis Date  ? Allergy   ? Arthritis   ? Asthma   ? GERD (gastroesophageal reflux disease)   ? Hyperlipidemia   ? Hypertension   ? Thyroid cancer ([place])   ? thyroid  ? Thyroid disease   ? Postsurgical hypothyroidism  ? ? ?Past Surgical History:  ?Procedure Laterality Date  ? ABDOMINAL HYSTERECTOMY  1993  ? CHOLECYSTECTOMY  1982  ? EXTRACORPOREAL SHOCK WAVE LITHOTRIPSY Left 09/17/2020  ? Procedure: EXTRACORPOREAL SHOCK WAVE LITHOTRIPSY (ESWL);  Surgeon: Franchot Gallo, MD;  Location: [facility];  Service: Urology;  Laterality: Left;  ? LAPAROSCOPIC GASTRIC SLEEVE RESECTION  07/2018  ? left knee replacement  2008  ? right ankle replacement  2012  ? right knee replacement  2009  ? trimallar FX  1995  ? right ankle  ? ? ?Allergies  ?Allergen Reactions  ? Lovenox [Enoxaparin Sodium] Rash  ?  rash  ? ? ?Family History  ?Problem Relation Age of Onset  ? COPD Mother   ? Heart disease Mother   ? Cancer Father   ?     thyroid cancer  ? Juvenile Rhematoid Arthritis Sister   ? Stroke Sister   ? Thyroid cancer Brother   ? Thyroid cancer Daughter   ? Healthy Daughter   ? Healthy Son   ? Healthy Daughter   ? Colon cancer Neg Hx   ? Esophageal cancer Neg Hx    ? Pancreatic cancer Neg Hx   ? Stomach cancer Neg Hx   ? Liver disease Neg Hx   ? ? ?Social History  ? ?Socioeconomic History  ? Marital status: Married  ?  Spouse name: Not on file  ? Number of children: Not on file  ? Years of education: Not on file  ? Highest education level: Not on file  ?Occupational History  ? Not on file  ?Tobacco Use  ? Smoking status: Never  ? Smokeless tobacco: Never  ?Vaping Use  ? Vaping Use: Never used  ?Substance and Sexual Activity  ? Alcohol use: Yes  ?  Comment: occ wine   ? Drug use: Never  ? Sexual activity: Yes  ?  Birth control/protection: None  ?Other Topics Concern  ? Not on file  ?Social History Narrative  ? Not on file  ? ?Social Determinants of Health  ? ?Financial Resource Strain: Low Risk   ? Difficulty of Paying Living Expenses: Not hard at all  ?  Food Insecurity: No Food Insecurity  ? Worried About Charity fundraiser in the Last Year: Never true  ? Ran Out of Food in the Last Year: Never true  ?Transportation Needs: No Transportation Needs  ? Lack of Transportation (Medical): No  ? Lack of Transportation (Non-Medical): No  ?Physical Activity: Sufficiently Active  ? Days of Exercise per Week: 3 days  ? Minutes of Exercise per Session: 120 min  ?Stress: No Stress Concern Present  ? Feeling of Stress : Not at all  ?Social Connections: Moderately Integrated  ? Frequency of Communication with Friends and Family: More than three times a week  ? Frequency of Social Gatherings with Friends and Family: More than three times a week  ? Attends Religious Services: Never  ? Active Member of Clubs or Organizations: Yes  ? Attends Archivist Meetings: More than 4 times per year  ? Marital Status: Married  ? ?Vitals:  ? 12/31/21 1039  ?BP: 120/70  ?Pulse: 84  ?Resp: 16  ?SpO2: 97%  ? ?Body mass index is 31.03 kg/m?. ? ?Wt Readings from Last 3 Encounters:  ?12/31/21 198 lb 2 oz (89.9 kg)  ?10/04/21 210 lb (95.3 kg)  ?09/29/21 210 lb 9.6 oz (95.5 kg)  ? ?Physical Exam ?Vitals  and nursing note reviewed.  ?Constitutional:   ?   General: She is not in acute distress. ?   Appearance: She is well-developed.  ?HENT:  ?   Head: Normocephalic and atraumatic.  ?   Right Ear: Hearing,

## 2021-12-31 ENCOUNTER — Ambulatory Visit (INDEPENDENT_AMBULATORY_CARE_PROVIDER_SITE_OTHER): Payer: Medicare Other | Admitting: Family Medicine

## 2021-12-31 ENCOUNTER — Encounter: Payer: Self-pay | Admitting: Family Medicine

## 2021-12-31 VITALS — BP 120/70 | HR 84 | Resp 16 | Ht 67.0 in | Wt 198.1 lb

## 2021-12-31 DIAGNOSIS — I700 Atherosclerosis of aorta: Secondary | ICD-10-CM | POA: Diagnosis not present

## 2021-12-31 DIAGNOSIS — E890 Postprocedural hypothyroidism: Secondary | ICD-10-CM

## 2021-12-31 DIAGNOSIS — E785 Hyperlipidemia, unspecified: Secondary | ICD-10-CM | POA: Diagnosis not present

## 2021-12-31 DIAGNOSIS — F325 Major depressive disorder, single episode, in full remission: Secondary | ICD-10-CM

## 2021-12-31 DIAGNOSIS — J4520 Mild intermittent asthma, uncomplicated: Secondary | ICD-10-CM | POA: Diagnosis not present

## 2021-12-31 DIAGNOSIS — I10 Essential (primary) hypertension: Secondary | ICD-10-CM | POA: Diagnosis not present

## 2021-12-31 DIAGNOSIS — Z0000 Encounter for general adult medical examination without abnormal findings: Secondary | ICD-10-CM

## 2021-12-31 NOTE — Patient Instructions (Addendum)
A few things to remember from today's visit: ? ?Routine general medical examination at a health care facility ? ?Essential hypertension ? ?Dyslipidemia ? ?Postoperative hypothyroidism ? ?If you need refills please call your pharmacy. ?Do not use My Chart to request refills or for acute issues that need immediate attention. ?  ?Please be sure medication list is accurate. ?If a new problem present, please set up appointment sooner than planned today. ?No biotin 2 weeks before thyroid check. ?Lab to be added to your next blood work at your rheumatologist's office. ? ?Alternate Lexapro between 1 and 1/2 tab every other day and in 2-3 months if well tolerated go to 1/2 tab daily. ? ?Lexapro No changes today. ?Because you see rheumatologist a few times per year has she checks your kidney function I think it is appropriate seeing you annually, before if needed. ?Continue monitoring blood pressure at home. ? ? ?Preventive Care 40 Years and Older, Female ?Preventive care refers to lifestyle choices and visits with your health care provider that can promote health and wellness. Preventive care visits are also called wellness exams. ?What can I expect for my preventive care visit? ?Counseling ?Your health care provider may ask you questions about your: ?Medical history, including: ?Past medical problems. ?Family medical history. ?Pregnancy and menstrual history. ?History of falls. ?Current health, including: ?Memory and ability to understand (cognition). ?Emotional well-being. ?Home life and relationship well-being. ?Sexual activity and sexual health. ?Lifestyle, including: ?Alcohol, nicotine or tobacco, and drug use. ?Access to firearms. ?Diet, exercise, and sleep habits. ?Work and work Statistician. ?Sunscreen use. ?Safety issues such as seatbelt and bike helmet use. ?Physical exam ?Your health care provider will check your: ?Height and weight. These may be used to calculate your BMI (body mass index). BMI is a measurement  that tells if you are at a healthy weight. ?Waist circumference. This measures the distance around your waistline. This measurement also tells if you are at a healthy weight and may help predict your risk of certain diseases, such as type 2 diabetes and high blood pressure. ?Heart rate and blood pressure. ?Body temperature. ?Skin for abnormal spots. ?What immunizations do I need? ? ?Vaccines are usually given at various ages, according to a schedule. Your health care provider will recommend vaccines for you based on your age, medical history, and lifestyle or other factors, such as travel or where you work. ?What tests do I need? ?Screening ?Your health care provider may recommend screening tests for certain conditions. This may include: ?Lipid and cholesterol levels. ?Hepatitis C test. ?Hepatitis B test. ?HIV (human immunodeficiency virus) test. ?STI (sexually transmitted infection) testing, if you are at risk. ?Lung cancer screening. ?Colorectal cancer screening. ?Diabetes screening. This is done by checking your blood sugar (glucose) after you have not eaten for a while (fasting). ?Mammogram. Talk with your health care provider about how often you should have regular mammograms. ?BRCA-related cancer screening. This may be done if you have a family history of breast, ovarian, tubal, or peritoneal cancers. ?Bone density scan. This is done to screen for osteoporosis. ?Talk with your health care provider about your test results, treatment options, and if necessary, the need for more tests. ?Follow these instructions at home: ?Eating and drinking ? ?Eat a diet that includes fresh fruits and vegetables, whole grains, lean protein, and low-fat dairy products. Limit your intake of foods with high amounts of sugar, saturated fats, and salt. ?Take vitamin and mineral supplements as recommended by your health care provider. ?Do not drink alcohol  if your health care provider tells you not to drink. ?If you drink  alcohol: ?Limit how much you have to 0-1 drink a day. ?Know how much alcohol is in your drink. In the [place] one drink equals one 12 oz bottle of beer (355 mL), one 5 oz glass of wine (148 mL), or one 1? oz glass of hard liquor (44 mL). ?Lifestyle ?Brush your teeth every morning and night with fluoride toothpaste. Floss one time each day. ?Exercise for at least 30 minutes 5 or more days each week. ?Do not use any products that contain nicotine or tobacco. These products include cigarettes, chewing tobacco, and vaping devices, such as e-cigarettes. If you need help quitting, ask your health care provider. ?Do not use drugs. ?If you are sexually active, practice safe sex. Use a condom or other form of protection in order to prevent STIs. ?Take aspirin only as told by your health care provider. Make sure that you understand how much to take and what form to take. Work with your health care provider to find out whether it is safe and beneficial for you to take aspirin daily. ?Ask your health care provider if you need to take a cholesterol-lowering medicine (statin). ?Find healthy ways to manage stress, such as: ?Meditation, yoga, or listening to music. ?Journaling. ?Talking to a trusted person. ?Spending time with friends and family. ?Minimize exposure to UV radiation to reduce your risk of skin cancer. ?Safety ?Always wear your seat belt while driving or riding in a vehicle. ?Do not drive: ?If you have been drinking alcohol. Do not ride with someone who has been drinking. ?When you are tired or distracted. ?While texting. ?If you have been using any mind-altering substances or drugs. ?Wear a helmet and other protective equipment during sports activities. ?If you have firearms in your house, make sure you follow all gun safety procedures. ?What's next? ?Visit your health care provider once a year for an annual wellness visit. ?Ask your health care provider how often you should have your eyes and teeth checked. ?Stay up  to date on all vaccines. ?This information is not intended to replace advice given to you by your health care provider. Make sure you discuss any questions you have with your health care provider. ?Document Revised: 02/24/2021 Document Reviewed: 02/24/2021 ?Elsevier Patient Education ? [employer]. ? ? ? ?

## 2021-12-31 NOTE — Assessment & Plan Note (Signed)
Last TSH still mildly abnormal at 0.32 in 12/2021. ?She will continue levothyroxine 137 mcg 1/2 tablet Tuesdays and Thursdays and 1 tablet the rest of the days. ?TSH to be added to next blood work, 03/01/2022, at her rheumatologist's office. ?Recommend holding on biotin intake 2 weeks before blood work. ? ?

## 2021-12-31 NOTE — Assessment & Plan Note (Signed)
Continue pravastatin 20 mg daily. ?We will add lipid panel to next blood work at her rheumatologist's office in 02/2022. ?

## 2021-12-31 NOTE — Assessment & Plan Note (Signed)
She has been taking Lexapro for about 10 years. ?She agrees with trying to wean off Lexapro, so she will start alternating between 10 mg and 5 mg every other day for 2 to 3 months.  If she tolerated this well she can decrease dose to 5 mg daily in 2 to 3 months and continue this dose until next visit. ?

## 2021-12-31 NOTE — Assessment & Plan Note (Signed)
BP adequately controlled. ?Continue current management: HCTZ, amlodipine, and benazepril. ?Continue monitoring BP at home regularly and following low-salt diet. ?

## 2021-12-31 NOTE — Assessment & Plan Note (Signed)
Problem is well controlled, she does not have concerns about this today. ?Continue Symbicort 160-4.5 mcg twice daily as needed. ?

## 2022-01-02 ENCOUNTER — Encounter: Payer: Self-pay | Admitting: Family Medicine

## 2022-01-04 ENCOUNTER — Other Ambulatory Visit: Payer: Self-pay | Admitting: Physician Assistant

## 2022-01-04 NOTE — Telephone Encounter (Signed)
Next Visit: 03/01/2022 ? ?Last Visit: 09/29/2021 ? ?Labs: 09/29/2021, CBC and CMP normal.  TSH is low and stable. ? ?Eye exam: 01/13/2021 WNL  ? ?Current Dose per office note 09/29/2021: plaquenil 200 mg 1 tablet by mouth twice daily ? ?DX: Rheumatoid arthritis of multiple sites with negative rheumatoid factor  ? ?Last Fill: 11/03/2021 ? ?Okay to refill Plaquenil? ? ?

## 2022-01-19 DIAGNOSIS — H43811 Vitreous degeneration, right eye: Secondary | ICD-10-CM | POA: Diagnosis not present

## 2022-01-19 DIAGNOSIS — M069 Rheumatoid arthritis, unspecified: Secondary | ICD-10-CM | POA: Diagnosis not present

## 2022-01-19 DIAGNOSIS — H1045 Other chronic allergic conjunctivitis: Secondary | ICD-10-CM | POA: Diagnosis not present

## 2022-01-19 DIAGNOSIS — Z79899 Other long term (current) drug therapy: Secondary | ICD-10-CM | POA: Diagnosis not present

## 2022-01-19 DIAGNOSIS — H04123 Dry eye syndrome of bilateral lacrimal glands: Secondary | ICD-10-CM | POA: Diagnosis not present

## 2022-01-19 DIAGNOSIS — H2513 Age-related nuclear cataract, bilateral: Secondary | ICD-10-CM | POA: Diagnosis not present

## 2022-02-14 ENCOUNTER — Telehealth: Payer: Self-pay

## 2022-02-14 NOTE — Telephone Encounter (Signed)
-----   Message from Rodrigo Ran, [place] sent at 12/20/2021  8:18 AM EDT ----- TSH in 6-8 weeks.

## 2022-02-24 ENCOUNTER — Other Ambulatory Visit (INDEPENDENT_AMBULATORY_CARE_PROVIDER_SITE_OTHER): Payer: Medicare Other

## 2022-02-24 DIAGNOSIS — E785 Hyperlipidemia, unspecified: Secondary | ICD-10-CM

## 2022-02-24 DIAGNOSIS — E890 Postprocedural hypothyroidism: Secondary | ICD-10-CM

## 2022-02-24 LAB — TSH: TSH: 2.23 u[IU]/mL (ref 0.35–5.50)

## 2022-02-24 LAB — LIPID PANEL
Cholesterol: 196 mg/dL (ref 0–200)
HDL: 86.8 mg/dL (ref >39.00)
LDL Cholesterol: 95 mg/dL (ref 0–99)
NonHDL: 109.52
Total CHOL/HDL Ratio: 2
Triglycerides: 72 mg/dL (ref 0.0–149.0)
VLDL: 14.4 mg/dL (ref 0.0–40.0)

## 2022-02-24 NOTE — Progress Notes (Unsigned)
Office Visit Note  Patient: Mercedes Hodge             Date of Birth: 1956/05/03           MRN: [id number]             PCP: Martinique, Betty G, MD Referring: Martinique, Betty G, MD Visit Date: 03/01/2022 Occupation: '@GUAROCC'$ @  Subjective:  Pain in both hands  History of Present Illness: Mercedes Hodge is a 66 y.o. female with history of seronegative rheumatoid arthritis and osteoarthritis.  She states that her arthritis is well controlled on hydroxychloroquine.  She continues to have some stiffness in her hands due to underlying osteoarthritis.  She has developed a cyst on the flexor tendon of her right ring finger which has been bothersome.  She states that she does crafts.  Her shoulder pain has resolved.  She continues to have some discomfort in her trochanteric bursa. Bilateral knee joint replacements are doing well.  She denies discomfort in her right ankle joint which is replaced.  Activities of Daily Living:  Patient reports morning stiffness for 10 minutes.   Patient Denies nocturnal pain.  Difficulty dressing/grooming: Denies Difficulty climbing stairs: Denies Difficulty getting out of chair: Denies Difficulty using hands for taps, buttons, cutlery, and/or writing: Denies  Review of Systems  Constitutional:  Negative for fatigue.  HENT:  Negative for mouth dryness.   Eyes:  Negative for dryness.  Respiratory:  Negative for shortness of breath.   Cardiovascular:  Negative for swelling in legs/feet.  Gastrointestinal:  Positive for constipation and diarrhea.  Endocrine: Negative for excessive thirst.  Genitourinary:  Negative for difficulty urinating.  Musculoskeletal:  Positive for joint pain, joint pain and morning stiffness. Negative for joint swelling.  Skin:  Negative for color change, rash and sensitivity to sunlight.  Allergic/Immunologic: Negative for susceptible to infections.  Neurological:  Negative for weakness.  Hematological:  Positive for  bruising/bleeding tendency.  Psychiatric/Behavioral:  Negative for sleep disturbance.     PMFS History:  Patient Active Problem List   Diagnosis Date Noted   Depression, major, in remission ([place]) 06/29/2021   Aortic atherosclerosis ([place]) 08/18/2020   History of colonic polyps 07/20/2020   Atrophic vaginitis 02/17/2020   Rheumatoid arthritis of multiple sites with negative rheumatoid factor ([place]) 07/02/2019   High risk medication use 07/02/2019   Primary osteoarthritis of both hands 07/02/2019   Status post total bilateral knee replacement 07/02/2019   Status post right ankle joint replacement 07/02/2019   Essential hypertension 07/02/2019   Asthma, intermittent, uncomplicated 67/20/9470   History of thyroid cancer 07/02/2019   Postoperative hypothyroidism 07/02/2019   Dyslipidemia 07/02/2019   Gastroesophageal reflux disease without esophagitis 07/02/2019   Osteopenia of multiple sites 07/02/2019    Past Medical History:  Diagnosis Date   Allergy    Arthritis    Asthma    GERD (gastroesophageal reflux disease)    Hyperlipidemia    Hypertension    Thyroid cancer ([place])    thyroid   Thyroid disease    Postsurgical hypothyroidism    Family History  Problem Relation Age of Onset   COPD Mother    Heart disease Mother    Cancer Father        thyroid cancer   Juvenile Rhematoid Arthritis Sister    Stroke Sister    Thyroid cancer Brother    Thyroid cancer Daughter    Healthy Daughter    Healthy Son    Healthy Daughter  Colon cancer Neg Hx    Esophageal cancer Neg Hx    Pancreatic cancer Neg Hx    Stomach cancer Neg Hx    Liver disease Neg Hx    Past Surgical History:  Procedure Laterality Date   ABDOMINAL HYSTERECTOMY  1993   CHOLECYSTECTOMY  1982   EXTRACORPOREAL SHOCK WAVE LITHOTRIPSY Left 09/17/2020   Procedure: EXTRACORPOREAL SHOCK WAVE LITHOTRIPSY (ESWL);  Surgeon: Franchot Gallo, MD;  Location: [facility];  Service: Urology;  Laterality:  Left;   LAPAROSCOPIC GASTRIC SLEEVE RESECTION  07/2018   left knee replacement  2008   right ankle replacement  2012   right knee replacement  2009   trimallar FX  1995   right ankle   Social History   Social History Narrative   Not on file   Immunization History  Administered Date(s) Administered   Fluad Quad(high Dose 65+) 06/29/2021   Influenza-Unspecified 05/22/2019, 06/10/2020   PFIZER Comirnaty(Gray Top)Covid-19 Tri-Sucrose Vaccine 05/08/2021   PFIZER(Purple Top)SARS-COV-2 Vaccination 11/17/2019, 12/08/2019, 06/10/2020   Pneumococcal Conjugate-13 06/07/2019   Pneumococcal Polysaccharide-23 08/18/2020   Tdap 06/07/2019   Zoster Recombinat (Shingrix) 12/23/2020, 02/27/2021     Objective: Vital Signs: BP 113/71 (BP Location: Left Arm, Patient Position: Sitting, Cuff Size: Small)   Pulse 83   Resp 12   Ht '5\' 6"'$  (1.676 m)   Wt 198 lb (89.8 kg)   BMI 31.96 kg/m    Physical Exam Vitals and nursing note reviewed.  Constitutional:      Appearance: She is well-developed.  HENT:     Head: Normocephalic and atraumatic.  Eyes:     Conjunctiva/sclera: Conjunctivae normal.  Cardiovascular:     Rate and Rhythm: Normal rate and regular rhythm.     Heart sounds: Normal heart sounds.  Pulmonary:     Effort: Pulmonary effort is normal.     Breath sounds: Normal breath sounds.  Abdominal:     General: Bowel sounds are normal.     Palpations: Abdomen is soft.  Musculoskeletal:     Cervical back: Normal range of motion.  Lymphadenopathy:     Cervical: No cervical adenopathy.  Skin:    General: Skin is warm and dry.     Capillary Refill: Capillary refill takes less than 2 seconds.  Neurological:     Mental Status: She is alert and oriented to person, place, and time.  Psychiatric:        Behavior: Behavior normal.      Musculoskeletal Exam: C-spine was in good range of motion.  Shoulder joints, elbow joints, wrist joints with good range of motion.  She had bilateral PIP  and DIP thickening with no synovitis.  A cyst was noted over the flexor tendon of her left ring finger.  Hip joints were in good range of motion.  Bilateral knee joints were replaced which were in good range of motion.  Right ankle joints is also replaced which is taken without any tenderness.  There was no tenderness over MTPs.  CDAI Exam: CDAI Score: 0.6  Patient Global: 3 mm; Provider Global: 3 mm Swollen: 0 ; Tender: 0  Joint Exam 03/01/2022   No joint exam has been documented for this visit   There is currently no information documented on the homunculus. Go to the Rheumatology activity and complete the homunculus joint exam.  Investigation: No additional findings.  Imaging: No results found.  Recent Labs: Lab Results  Component Value Date   WBC 6.5 09/29/2021   HGB 14.5  09/29/2021   PLT 197 09/29/2021   NA 135 09/29/2021   K 3.8 09/29/2021   CL 96 (L) 09/29/2021   CO2 33 (H) 09/29/2021   GLUCOSE 103 (H) 09/29/2021   BUN 19 09/29/2021   CREATININE 0.73 09/29/2021   BILITOT 0.7 09/29/2021   AST 24 09/29/2021   ALT 17 09/29/2021   PROT 6.7 09/29/2021   CALCIUM 9.3 09/29/2021   GFRAA 93 11/27/2020    Speciality Comments: Plaquenil eye exam: 5/10/223  W[facility] Follow up in 1 year  Procedures:  No procedures performed Allergies: Lovenox [enoxaparin sodium]   Assessment / Plan:     Visit Diagnoses: Rheumatoid arthritis of multiple sites with negative rheumatoid factor (HCC) + anti-CCP - Diagnosed in [place].  She has been doing well on hydroxychloroquine 200 mg 1 tablet p.o. twice daily.  She had no synovitis on my examination.  Eye examination was normal on Jan 13, 2021.  We will get labs today.  She continues to have some stiffness in her hands which is due to underlying osteoarthritis.  No synovitis was noted.  High risk medication use - Plaquenil 200 mg 1 tablet p.o. twice daily.  Eye examination May 06/2022 was normal at [facility] eye care. - Plan:  CBC with Differential/Platelet, COMPLETE METABOLIC PANEL WITH GFR today and every 5 months.  Information regarding mineralization was placed in the AVS.  Primary osteoarthritis of both hands-joint protection muscle strengthening was discussed.  A handout on hand exercises was given.  Ganglion cyst of flexor tendon sheath of finger of right hand-I will refer her to hand surgery for evaluation and treatment.  Trigger finger, right middle finger-resolved.  Trochanteric bursitis of both hips-she continues to have some trochanteric bursa discomfort.  IT band stretches were demonstrated.  Status post total bilateral knee replacement-doing well without discomfort.  Status post right ankle joint replacement - Due to Charcot joint.  She denies any discomfort.  Osteopenia of multiple sites - T score -1.11 Jan 2020.  Use of calcium rich diet and vitamin D was discussed.  We will repeat DEXA scan in 2024.  Essential hypertension-blood pressure was normal today.  Dyslipidemia  History of thyroid cancer  Gastroesophageal reflux disease without esophagitis  History of asthma  Orders: Orders Placed This Encounter  Procedures   CBC with Differential/Platelet   COMPLETE METABOLIC PANEL WITH GFR   No orders of the defined types were placed in this encounter.    Follow-Up Instructions: Return in about 5 months (around 08/01/2022) for Rheumatoid arthritis.   Bo Merino, MD  Note - This record has been created using Editor, commissioning.  Chart creation errors have been sought, but may not always  have been located. Such creation errors do not reflect on  the standard of medical care.

## 2022-03-01 ENCOUNTER — Ambulatory Visit: Payer: Medicare Other | Admitting: Rheumatology

## 2022-03-01 ENCOUNTER — Encounter: Payer: Self-pay | Admitting: Rheumatology

## 2022-03-01 VITALS — BP 113/71 | HR 83 | Resp 12 | Ht 66.0 in | Wt 198.0 lb

## 2022-03-01 DIAGNOSIS — M19041 Primary osteoarthritis, right hand: Secondary | ICD-10-CM

## 2022-03-01 DIAGNOSIS — E785 Hyperlipidemia, unspecified: Secondary | ICD-10-CM | POA: Diagnosis not present

## 2022-03-01 DIAGNOSIS — M19042 Primary osteoarthritis, left hand: Secondary | ICD-10-CM

## 2022-03-01 DIAGNOSIS — M8589 Other specified disorders of bone density and structure, multiple sites: Secondary | ICD-10-CM

## 2022-03-01 DIAGNOSIS — I10 Essential (primary) hypertension: Secondary | ICD-10-CM | POA: Diagnosis not present

## 2022-03-01 DIAGNOSIS — M7061 Trochanteric bursitis, right hip: Secondary | ICD-10-CM | POA: Diagnosis not present

## 2022-03-01 DIAGNOSIS — M65331 Trigger finger, right middle finger: Secondary | ICD-10-CM | POA: Diagnosis not present

## 2022-03-01 DIAGNOSIS — M67441 Ganglion, right hand: Secondary | ICD-10-CM

## 2022-03-01 DIAGNOSIS — Z96653 Presence of artificial knee joint, bilateral: Secondary | ICD-10-CM

## 2022-03-01 DIAGNOSIS — Z85850 Personal history of malignant neoplasm of thyroid: Secondary | ICD-10-CM | POA: Diagnosis not present

## 2022-03-01 DIAGNOSIS — Z79899 Other long term (current) drug therapy: Secondary | ICD-10-CM | POA: Diagnosis not present

## 2022-03-01 DIAGNOSIS — K219 Gastro-esophageal reflux disease without esophagitis: Secondary | ICD-10-CM

## 2022-03-01 DIAGNOSIS — Z96661 Presence of right artificial ankle joint: Secondary | ICD-10-CM

## 2022-03-01 DIAGNOSIS — G8929 Other chronic pain: Secondary | ICD-10-CM

## 2022-03-01 DIAGNOSIS — M0609 Rheumatoid arthritis without rheumatoid factor, multiple sites: Secondary | ICD-10-CM

## 2022-03-01 DIAGNOSIS — Z8709 Personal history of other diseases of the respiratory system: Secondary | ICD-10-CM

## 2022-03-01 DIAGNOSIS — M7062 Trochanteric bursitis, left hip: Secondary | ICD-10-CM

## 2022-03-01 NOTE — Patient Instructions (Addendum)
Vaccines You are taking a medication(s) that can suppress your immune system.  The following immunizations are recommended: Flu annually Covid-19  Td/Tdap (tetanus, diphtheria, pertussis) every 10 years Pneumonia (Prevnar 15 then Pneumovax 23 at least 1 year apart.  Alternatively, can take Prevnar 20 without needing additional dose) Shingrix: 2 doses from 4 weeks to 6 months apart  Please check with your PCP to make sure you are up to date.  Hand Exercises Hand exercises can be helpful for almost anyone. These exercises can strengthen the hands, improve flexibility and movement, and increase blood flow to the hands. These results can make work and daily tasks easier. Hand exercises can be especially helpful for people who have joint pain from arthritis or have nerve damage from overuse (carpal tunnel syndrome). These exercises can also help people who have injured a hand. Exercises Most of these hand exercises are gentle stretching and motion exercises. It is usually safe to do them often throughout the day. Warming up your hands before exercise may help to reduce stiffness. You can do this with gentle massage or by placing your hands in warm water for 10-15 minutes. It is normal to feel some stretching, pulling, tightness, or mild discomfort as you begin new exercises. This will gradually improve. Stop an exercise right away if you feel sudden, severe pain or your pain gets worse. Ask your health care provider which exercises are best for you. Knuckle bend or "claw" fist  Stand or sit with your arm, hand, and all five fingers pointed straight up. Make sure to keep your wrist straight during the exercise. Gently bend your fingers down toward your palm until the tips of your fingers are touching the top of your palm. Keep your big knuckle straight and just bend the small knuckles in your fingers. Hold this position for __________ seconds. Straighten (extend) your fingers back to the starting  position. Repeat this exercise 5-10 times with each hand. Full finger fist  Stand or sit with your arm, hand, and all five fingers pointed straight up. Make sure to keep your wrist straight during the exercise. Gently bend your fingers into your palm until the tips of your fingers are touching the middle of your palm. Hold this position for __________ seconds. Extend your fingers back to the starting position, stretching every joint fully. Repeat this exercise 5-10 times with each hand. Straight fist Stand or sit with your arm, hand, and all five fingers pointed straight up. Make sure to keep your wrist straight during the exercise. Gently bend your fingers at the big knuckle, where your fingers meet your hand, and the middle knuckle. Keep the knuckle at the tips of your fingers straight and try to touch the bottom of your palm. Hold this position for __________ seconds. Extend your fingers back to the starting position, stretching every joint fully. Repeat this exercise 5-10 times with each hand. Tabletop  Stand or sit with your arm, hand, and all five fingers pointed straight up. Make sure to keep your wrist straight during the exercise. Gently bend your fingers at the big knuckle, where your fingers meet your hand, as far down as you can while keeping the small knuckles in your fingers straight. Think of forming a tabletop with your fingers. Hold this position for __________ seconds. Extend your fingers back to the starting position, stretching every joint fully. Repeat this exercise 5-10 times with each hand. Finger spread  Place your hand flat on a table with your palm facing  down. Make sure your wrist stays straight as you do this exercise. Spread your fingers and thumb apart from each other as far as you can until you feel a gentle stretch. Hold this position for __________ seconds. Bring your fingers and thumb tight together again. Hold this position for __________ seconds. Repeat  this exercise 5-10 times with each hand. Making circles  Stand or sit with your arm, hand, and all five fingers pointed straight up. Make sure to keep your wrist straight during the exercise. Make a circle by touching the tip of your thumb to the tip of your index finger. Hold for __________ seconds. Then open your hand wide. Repeat this motion with your thumb and each finger on your hand. Repeat this exercise 5-10 times with each hand. Thumb motion  Sit with your forearm resting on a table and your wrist straight. Your thumb should be facing up toward the ceiling. Keep your fingers relaxed as you move your thumb. Lift your thumb up as high as you can toward the ceiling. Hold for __________ seconds. Bend your thumb across your palm as far as you can, reaching the tip of your thumb for the small finger (pinkie) side of your palm. Hold for __________ seconds. Repeat this exercise 5-10 times with each hand. Grip strengthening  Hold a stress ball or other soft ball in the middle of your hand. Slowly increase the pressure, squeezing the ball as much as you can without causing pain. Think of bringing the tips of your fingers into the middle of your palm. All of your finger joints should bend when doing this exercise. Hold your squeeze for __________ seconds, then relax. Repeat this exercise 5-10 times with each hand. Contact a health care provider if: Your hand pain or discomfort gets much worse when you do an exercise. Your hand pain or discomfort does not improve within 2 hours after you exercise. If you have any of these problems, stop doing these exercises right away. Do not do them again unless your health care provider says that you can. Get help right away if: You develop sudden, severe hand pain or swelling. If this happens, stop doing these exercises right away. Do not do them again unless your health care provider says that you can. This information is not intended to replace advice  given to you by your health care provider. Make sure you discuss any questions you have with your health care provider. Document Revised: 12/17/2020 Document Reviewed: 12/17/2020 Elsevier Patient Education  [place].

## 2022-03-01 NOTE — Addendum Note (Signed)
Addended by: Shona Needles on: 03/01/2022 06:11 PM   Modules accepted: Orders

## 2022-03-02 LAB — CBC WITH DIFFERENTIAL/PLATELET
Absolute Monocytes: 766 cells/uL (ref 200–950)
Basophils Absolute: 90 cells/uL (ref 0–200)
Basophils Relative: 1.3 %
Eosinophils Absolute: 228 cells/uL (ref 15–500)
Eosinophils Relative: 3.3 %
HCT: 43.2 % (ref 35.0–45.0)
Hemoglobin: 14.7 g/dL (ref 11.7–15.5)
Lymphs Abs: 1677 cells/uL (ref 850–3900)
MCH: 33.3 pg — ABNORMAL HIGH (ref 27.0–33.0)
MCHC: 34 g/dL (ref 32.0–36.0)
MCV: 98 fL (ref 80.0–100.0)
MPV: 9.4 fL (ref 7.5–12.5)
Monocytes Relative: 11.1 %
Neutro Abs: 4140 cells/uL (ref 1500–7800)
Neutrophils Relative %: 60 %
Platelets: 211 10*3/uL (ref 140–400)
RBC: 4.41 10*6/uL (ref 3.80–5.10)
RDW: 13 % (ref 11.0–15.0)
Total Lymphocyte: 24.3 %
WBC: 6.9 10*3/uL (ref 3.8–10.8)

## 2022-03-02 LAB — COMPLETE METABOLIC PANEL WITH GFR
AG Ratio: 1.7 (calc) (ref 1.0–2.5)
ALT: 14 U/L (ref 6–29)
AST: 25 U/L (ref 10–35)
Albumin: 4.3 g/dL (ref 3.6–5.1)
Alkaline phosphatase (APISO): 63 U/L (ref 37–153)
BUN: 19 mg/dL (ref 7–25)
CO2: 28 mmol/L (ref 20–32)
Calcium: 9.7 mg/dL (ref 8.6–10.4)
Chloride: 98 mmol/L (ref 98–110)
Creat: 0.98 mg/dL (ref 0.50–1.05)
Globulin: 2.6 g/dL (calc) (ref 1.9–3.7)
Glucose, Bld: 92 mg/dL (ref 65–99)
Potassium: 4.4 mmol/L (ref 3.5–5.3)
Sodium: 135 mmol/L (ref 135–146)
Total Bilirubin: 0.5 mg/dL (ref 0.2–1.2)
Total Protein: 6.9 g/dL (ref 6.1–8.1)
eGFR: 63 mL/min/{1.73_m2} (ref >=60)

## 2022-03-04 ENCOUNTER — Ambulatory Visit: Payer: Medicare Other | Admitting: Orthopedic Surgery

## 2022-03-04 ENCOUNTER — Encounter: Payer: Self-pay | Admitting: Orthopedic Surgery

## 2022-03-04 DIAGNOSIS — M6740 Ganglion, unspecified site: Secondary | ICD-10-CM | POA: Diagnosis not present

## 2022-03-04 NOTE — Progress Notes (Signed)
Office Visit Note   Patient: Mercedes Hodge           Date of Birth: 1956/01/13           MRN: [id number] Visit Date: 03/04/2022              Requested by: Bo Merino, MD [address] [place],  [place] [address] PCP: [place], Betty G, MD   Assessment & Plan: Visit Diagnoses:  1. Ganglion cyst of flexor tendon sheath     Plan: Patient has a retinacular cyst at the right ring finger A1 pulley.  We discussed the nature of ganglion/retinacular cysts and both conservative and surgical treatment options.  After our discussion, she would like to proceed with cyst aspiration.  We reviewed the risks of aspiration including bleeding, infection, and cyst recurrence.   Follow-Up Instructions: No follow-ups on file.   Orders:  No orders of the defined types were placed in this encounter.  No orders of the defined types were placed in this encounter.     Procedures: Hand/UE Inj for (Retinacular cyst) on 03/04/2022 5:35 PM Indications: therapeutic Details: 18 G needle, radial approach Aspirate: (amount: 0.5) clear Outcome: tolerated well, no immediate complications Procedure, treatment alternatives, risks and benefits explained, specific risks discussed. Consent was given by the patient. Immediately prior to procedure a time out was called to verify the correct patient, procedure, equipment, support staff and site/side marked as required. Patient was prepped and draped in the usual sterile fashion.       Clinical Data: No additional findings.   Subjective: Chief Complaint  Patient presents with   Right Ring Finger - Cyst    This is a 66 yo RHD F who presents with a mass at the volar aspect of the right hand over the ring finger A1 pulley.  It has present for about 3 to 4 months.  She was previously having a trigger finger but this resolved on its own.  She thinks the cyst has enlarged slightly over the last 3 months.  The cyst is not painful but she does have  pain in the area when she tries to tightly grip an object.  She had no treatment for this so far.    Review of Systems   Objective: Vital Signs: Ht '5\' 6"'$  (1.676 m)   Wt 198 lb (89.8 kg)   BMI 31.96 kg/m   Physical Exam Constitutional:      Appearance: Normal appearance.  Cardiovascular:     Rate and Rhythm: Normal rate.  Pulmonary:     Effort: Pulmonary effort is normal.  Skin:    General: Skin is warm and dry.     Capillary Refill: Capillary refill takes less than 2 seconds.  Neurological:     Mental Status: She is alert.     Right Hand Exam   Tenderness  The patient is experiencing no tenderness.   Other  Erythema: absent Sensation: normal Pulse: present  Comments:  Firm, round, mobile mass at volar aspect of ring finger at level of A1 pulley.  <1 cm in diameter. No triggering.       Specialty Comments:  No specialty comments available.  Imaging: No results found.   PMFS History: Patient Active Problem List   Diagnosis Date Noted   Ganglion cyst of flexor tendon sheath 03/04/2022   Depression, major, in remission ([place]) 06/29/2021   Aortic atherosclerosis ([place]) 08/18/2020   History of colonic polyps 07/20/2020   Atrophic vaginitis 02/17/2020  Rheumatoid arthritis of multiple sites with negative rheumatoid factor ([place]) 07/02/2019   High risk medication use 07/02/2019   Primary osteoarthritis of both hands 07/02/2019   Status post total bilateral knee replacement 07/02/2019   Status post right ankle joint replacement 07/02/2019   Essential hypertension 07/02/2019   Asthma, intermittent, uncomplicated 38/25/0539   History of thyroid cancer 07/02/2019   Postoperative hypothyroidism 07/02/2019   Dyslipidemia 07/02/2019   Gastroesophageal reflux disease without esophagitis 07/02/2019   Osteopenia of multiple sites 07/02/2019   Past Medical History:  Diagnosis Date   Allergy    Arthritis    Asthma    GERD (gastroesophageal reflux disease)     Hyperlipidemia    Hypertension    Thyroid cancer ([place])    thyroid   Thyroid disease    Postsurgical hypothyroidism    Family History  Problem Relation Age of Onset   COPD Mother    Heart disease Mother    Cancer Father        thyroid cancer   Juvenile Rhematoid Arthritis Sister    Stroke Sister    Thyroid cancer Brother    Thyroid cancer Daughter    Healthy Daughter    Healthy Son    Healthy Daughter    Colon cancer Neg Hx    Esophageal cancer Neg Hx    Pancreatic cancer Neg Hx    Stomach cancer Neg Hx    Liver disease Neg Hx     Past Surgical History:  Procedure Laterality Date   ABDOMINAL HYSTERECTOMY  1993   CHOLECYSTECTOMY  1982   EXTRACORPOREAL SHOCK WAVE LITHOTRIPSY Left 09/17/2020   Procedure: EXTRACORPOREAL SHOCK WAVE LITHOTRIPSY (ESWL);  Surgeon: Franchot Gallo, MD;  Location: [facility];  Service: Urology;  Laterality: Left;   LAPAROSCOPIC GASTRIC SLEEVE RESECTION  07/2018   left knee replacement  2008   right ankle replacement  2012   right knee replacement  2009   trimallar FX  1995   right ankle   Social History   Occupational History   Not on file  Tobacco Use   Smoking status: Never    Passive exposure: Past   Smokeless tobacco: Never  Vaping Use   Vaping Use: Never used  Substance and Sexual Activity   Alcohol use: Yes    Comment: occ wine    Drug use: Never   Sexual activity: Yes    Birth control/protection: None

## 2022-03-19 ENCOUNTER — Other Ambulatory Visit: Payer: Self-pay | Admitting: Physician Assistant

## 2022-03-21 NOTE — Telephone Encounter (Signed)
Next Visit: 08/23/2022  Last Visit: 03/01/2022  Labs: 03/01/2022 CBC and CMP WNL  Eye exam:  5/10/223  WNL   Current Dose per office note 03/01/2022: Plaquenil 200 mg 1 tablet p.o. twice daily  DX: Rheumatoid arthritis of multiple sites with negative rheumatoid factor   Last Fill: 01/04/2022  Okay to refill Plaquenil?

## 2022-03-24 ENCOUNTER — Other Ambulatory Visit: Payer: Self-pay | Admitting: Family Medicine

## 2022-03-24 DIAGNOSIS — I10 Essential (primary) hypertension: Secondary | ICD-10-CM

## 2022-03-24 DIAGNOSIS — E785 Hyperlipidemia, unspecified: Secondary | ICD-10-CM

## 2022-03-29 ENCOUNTER — Other Ambulatory Visit: Payer: Self-pay | Admitting: Family Medicine

## 2022-03-29 ENCOUNTER — Ambulatory Visit: Payer: Medicare Other | Admitting: Orthopedic Surgery

## 2022-03-29 DIAGNOSIS — E890 Postprocedural hypothyroidism: Secondary | ICD-10-CM

## 2022-04-10 DIAGNOSIS — T84063A Wear of articular bearing surface of internal prosthetic left knee joint, initial encounter: Secondary | ICD-10-CM | POA: Diagnosis not present

## 2022-04-10 DIAGNOSIS — Y9301 Activity, walking, marching and hiking: Secondary | ICD-10-CM | POA: Diagnosis not present

## 2022-04-10 DIAGNOSIS — E039 Hypothyroidism, unspecified: Secondary | ICD-10-CM | POA: Diagnosis not present

## 2022-04-10 DIAGNOSIS — W010XXA Fall on same level from slipping, tripping and stumbling without subsequent striking against object, initial encounter: Secondary | ICD-10-CM | POA: Diagnosis not present

## 2022-04-10 DIAGNOSIS — S82832A Other fracture of upper and lower end of left fibula, initial encounter for closed fracture: Secondary | ICD-10-CM | POA: Diagnosis not present

## 2022-04-10 DIAGNOSIS — R112 Nausea with vomiting, unspecified: Secondary | ICD-10-CM | POA: Diagnosis not present

## 2022-04-10 DIAGNOSIS — R0602 Shortness of breath: Secondary | ICD-10-CM | POA: Diagnosis not present

## 2022-04-10 DIAGNOSIS — Z85850 Personal history of malignant neoplasm of thyroid: Secondary | ICD-10-CM | POA: Diagnosis not present

## 2022-04-10 DIAGNOSIS — M9712XA Periprosthetic fracture around internal prosthetic left knee joint, initial encounter: Secondary | ICD-10-CM | POA: Diagnosis not present

## 2022-04-10 DIAGNOSIS — I440 Atrioventricular block, first degree: Secondary | ICD-10-CM | POA: Diagnosis not present

## 2022-04-10 DIAGNOSIS — Z96653 Presence of artificial knee joint, bilateral: Secondary | ICD-10-CM | POA: Diagnosis not present

## 2022-04-10 DIAGNOSIS — G8911 Acute pain due to trauma: Secondary | ICD-10-CM | POA: Diagnosis not present

## 2022-04-10 DIAGNOSIS — I10 Essential (primary) hypertension: Secondary | ICD-10-CM | POA: Diagnosis not present

## 2022-04-10 DIAGNOSIS — Z9049 Acquired absence of other specified parts of digestive tract: Secondary | ICD-10-CM | POA: Diagnosis not present

## 2022-04-10 DIAGNOSIS — W19XXXA Unspecified fall, initial encounter: Secondary | ICD-10-CM | POA: Diagnosis not present

## 2022-04-10 DIAGNOSIS — Z96651 Presence of right artificial knee joint: Secondary | ICD-10-CM | POA: Diagnosis not present

## 2022-04-10 DIAGNOSIS — W1830XA Fall on same level, unspecified, initial encounter: Secondary | ICD-10-CM | POA: Diagnosis not present

## 2022-04-10 DIAGNOSIS — J45909 Unspecified asthma, uncomplicated: Secondary | ICD-10-CM | POA: Diagnosis not present

## 2022-04-10 DIAGNOSIS — S72302A Unspecified fracture of shaft of left femur, initial encounter for closed fracture: Secondary | ICD-10-CM | POA: Diagnosis not present

## 2022-04-10 DIAGNOSIS — E785 Hyperlipidemia, unspecified: Secondary | ICD-10-CM | POA: Diagnosis not present

## 2022-04-10 DIAGNOSIS — S72445A Nondisplaced fracture of lower epiphysis (separation) of left femur, initial encounter for closed fracture: Secondary | ICD-10-CM | POA: Diagnosis not present

## 2022-04-10 DIAGNOSIS — S72402A Unspecified fracture of lower end of left femur, initial encounter for closed fracture: Secondary | ICD-10-CM | POA: Diagnosis not present

## 2022-04-10 DIAGNOSIS — E8771 Transfusion associated circulatory overload: Secondary | ICD-10-CM | POA: Diagnosis not present

## 2022-04-10 DIAGNOSIS — M79605 Pain in left leg: Secondary | ICD-10-CM | POA: Diagnosis not present

## 2022-04-10 DIAGNOSIS — E7800 Pure hypercholesterolemia, unspecified: Secondary | ICD-10-CM | POA: Diagnosis not present

## 2022-04-10 DIAGNOSIS — M1990 Unspecified osteoarthritis, unspecified site: Secondary | ICD-10-CM | POA: Diagnosis not present

## 2022-04-10 DIAGNOSIS — S72492A Other fracture of lower end of left femur, initial encounter for closed fracture: Secondary | ICD-10-CM | POA: Diagnosis not present

## 2022-04-10 DIAGNOSIS — E871 Hypo-osmolality and hyponatremia: Secondary | ICD-10-CM | POA: Diagnosis not present

## 2022-04-10 DIAGNOSIS — D72829 Elevated white blood cell count, unspecified: Secondary | ICD-10-CM | POA: Diagnosis not present

## 2022-04-10 DIAGNOSIS — D62 Acute posthemorrhagic anemia: Secondary | ICD-10-CM | POA: Diagnosis not present

## 2022-04-10 DIAGNOSIS — E890 Postprocedural hypothyroidism: Secondary | ICD-10-CM | POA: Diagnosis not present

## 2022-04-10 DIAGNOSIS — S72442A Displaced fracture of lower epiphysis (separation) of left femur, initial encounter for closed fracture: Secondary | ICD-10-CM | POA: Diagnosis not present

## 2022-04-10 DIAGNOSIS — S72492D Other fracture of lower end of left femur, subsequent encounter for closed fracture with routine healing: Secondary | ICD-10-CM | POA: Diagnosis not present

## 2022-04-10 DIAGNOSIS — I2510 Atherosclerotic heart disease of native coronary artery without angina pectoris: Secondary | ICD-10-CM | POA: Diagnosis not present

## 2022-04-10 DIAGNOSIS — Y929 Unspecified place or not applicable: Secondary | ICD-10-CM | POA: Diagnosis not present

## 2022-04-13 HISTORY — PX: ORIF FEMUR FRACTURE: SHX2119

## 2022-04-13 HISTORY — PX: OTHER SURGICAL HISTORY: SHX169

## 2022-04-18 ENCOUNTER — Encounter: Payer: Self-pay | Admitting: Family Medicine

## 2022-04-18 DIAGNOSIS — S7290XA Unspecified fracture of unspecified femur, initial encounter for closed fracture: Secondary | ICD-10-CM | POA: Diagnosis not present

## 2022-04-20 NOTE — Progress Notes (Unsigned)
HPI: Mercedes Hodge is a 66 y.o. female with history of RA, OA, postoperative hypothyroidism, depression in remission, GERD, hyperlipidemia, osteopenia, and hypertension here today with her husband to follow on recent hospital visit. Evaluated in the ED [facility] , [place]) after fall in her daughter's house while she was visiting her in [place].  She was walking on soaks, slipped and fell, landing on her knees. Fall complicated with distal periprosthetic femoral fracture.   She was admitted on 04/10/2022 and discharged home 04/17/2022. Back home Sunday.  She had orthopedic consultation during hospitalization, status post ORIF left femoral shaft fracture and revision of total knee arthroplasty on 04/13/22.  She is currently on heparin 5000 units [place] twice daily for DVT prophylaxis, she received a prescription for Eliquis 2.5 but was instructed not to hold on it until she follows with PCP. Negative for nose/gum bleeding, CP, dyspnea, palpitations, blood in the stool, melena, or gross hematuria.  In general she feels like she is getting better. She was discharged on oxycodone, took once and aggravated constipation.  Taking Tylenol, which has not helped with pain.  She is using a walker for transfer and walking a few times around the house.  She is wearing a hinged knee brace, which she was instructed to keep for 6 weeks.  She has an appt with orthopedist next Tuesday.  Acute anemia, thought to be caused by blood loss during surgical procedure. She received 1 unit of PRBCs 04/16/22. 04/17/22: Auto WBC 4.40 - 11.30 k/mcL 6.12   Hemoglobin 12.3 - 15.3 g/dL 7.9 Low    Hematocrit 36.0 - 45.0 % 22.7 Low    MCV 80.0 - 96.0 fL 93.8   MCH 27.5 - 33.2 pg 32.6   MCHC 31.0 - 35.9 g/dL 34.8   RDW 11.3 - 15.3 % 13.8   Platelet Count 145 - 445 k/mcL 213   MPV 7.4 - 10.4 fL 9.1   RBC 3.70 - 5.19 m/mcL 2.42 Low    Nucleated Red Blood Cells <=0 /100 0   Resulting Agency     Mild  hyponatremia, last BMP 04/15/22  Ref Range & Units 7 d ago Comments  Glucose 70 - 99 mg/dL 118 High   The reference range is for fasting only.   Non-fasting glucose reference range:  1 year and above: 70 - 140 mg/dL  Reference range not established for 0-11 months   BUN 8 - 23 mg/dL 13    Creatinine 0.50 - 0.90 mg/dL 0.59    eGFR (CKD-EPI) >=60 mL/min/1.73 m2 98  eGFR calculation and reference range based on the Chronic Kidney Disease Epidemiology Collaboration 2021 (CKD-EPI) equation without adjustments for race.  (NEJM 385;19; 2021).  Sodium 136 - 145 mmol/L 129 Low     Potassium 3.5 - 5.2 mmol/L 4.0    Chloride 98 - 107 mmol/L 93 Low     CO2 22 - 30 mmol/L 27    Anion Gap 7 - 16 mmol/L 9    Calcium 8.4 - 10.3 mg/dL 7.9 Low      Review of Systems  Constitutional:  Positive for fatigue. Negative for activity change, appetite change, fever and unexpected weight change.  HENT:  Negative for mouth sores and sore throat.   Eyes:  Negative for redness and visual disturbance.  Respiratory:  Negative for cough and wheezing.   Gastrointestinal:  Negative for abdominal pain, nausea and vomiting.       Negative for changes in bowel habits.  Genitourinary:  Negative for decreased urine volume, dysuria and hematuria.  Neurological:  Negative for syncope and headaches.  Rest see pertinent positives and negatives per HPI.  Current Outpatient Medications on File Prior to Visit  Medication Sig Dispense Refill   amLODipine-benazepril (LOTREL) 5-40 MG capsule TAKE 1 CAPSULE BY MOUTH  DAILY 100 capsule 2   calcium carbonate (OS-CAL) 600 MG tablet Take 600 mg by mouth daily.     Cholecalciferol (VITAMIN D) 50 MCG (2000 UT) tablet Take 2,000 Units by mouth daily.     diclofenac sodium (VOLTAREN) 1 % GEL Apply topically 4 (four) times daily.     escitalopram (LEXAPRO) 10 MG tablet TAKE 1 TABLET BY MOUTH  DAILY (Patient taking differently: Take 5 mg by mouth daily.) 90 tablet 3   fluticasone (FLONASE) 50  MCG/ACT nasal spray Place into both nostrils as needed for allergies or rhinitis.     hydrochlorothiazide (HYDRODIURIL) 12.5 MG tablet TAKE 1 TABLET BY MOUTH  DAILY 100 tablet 2   hydroxychloroquine (PLAQUENIL) 200 MG tablet TAKE 1 TABLET BY MOUTH TWICE  DAILY 180 tablet 3   levothyroxine (SYNTHROID) 137 MCG tablet Take 1 tablet by mouth daily except take 1/2 tablet on Tuesdays & Thursdays. 90 tablet 2   pramoxine-hydrocortisone (PROCTOCREAM-HC) 1-1 % rectal cream Place 1 application rectally 2 (two) times daily as needed for hemorrhoids or anal itching. 30 g 1   pravastatin (PRAVACHOL) 40 MG tablet Take 40 mg by mouth daily.     SYMBICORT 160-4.5 MCG/ACT inhaler USE 2 INHALATIONS BY MOUTH  TWICE DAILY 30.6 g 3   No current facility-administered medications on file prior to visit.   Past Medical History:  Diagnosis Date   Allergy    Arthritis    Asthma    GERD (gastroesophageal reflux disease)    Hyperlipidemia    Hypertension    Thyroid cancer (HCC)    thyroid   Thyroid disease    Postsurgical hypothyroidism   Allergies  Allergen Reactions   Lovenox [Enoxaparin Sodium] Rash    rash   Social History   Socioeconomic History   Marital status: Married    Spouse name: Not on file   Number of children: Not on file   Years of education: Not on file   Highest education level: Not on file  Occupational History   Not on file  Tobacco Use   Smoking status: Never    Passive exposure: Past   Smokeless tobacco: Never  Vaping Use   Vaping Use: Never used  Substance and Sexual Activity   Alcohol use: Yes    Comment: occ wine    Drug use: Never   Sexual activity: Yes    Birth control/protection: None  Other Topics Concern   Not on file  Social History Narrative   Not on file   Social Determinants of Health   Financial Resource Strain: Low Risk  (10/04/2021)   Overall Financial Resource Strain (CARDIA)    Difficulty of Paying Living Expenses: Not hard at all  Food  Insecurity: No Food Insecurity (10/04/2021)   Hunger Vital Sign    Worried About Running Out of Food in the Last Year: Never true    Ran Out of Food in the Last Year: Never true  Transportation Needs: No Transportation Needs (10/04/2021)   PRAPARE - Hydrologist (Medical): No    Lack of Transportation (Non-Medical): No  Physical Activity: Sufficiently Active (10/04/2021)   Exercise Vital Sign    Days  of Exercise per Week: 3 days    Minutes of Exercise per Session: 120 min  Stress: No Stress Concern Present (10/04/2021)   [place]    Feeling of Stress : Not at all  Social Connections: Moderately Integrated (10/04/2021)   Social Connection and Isolation Panel [NHANES]    Frequency of Communication with Friends and Family: More than three times a week    Frequency of Social Gatherings with Friends and Family: More than three times a week    Attends Religious Services: Never    Marine scientist or Organizations: Yes    Attends Music therapist: More than 4 times per year    Marital Status: Married   Vitals:   04/22/22 1357  BP: 110/70  Pulse: 94  Resp: 12  SpO2: 98%   Body mass index is 31.96 kg/m.  Physical Exam Vitals and nursing note reviewed.  Constitutional:      General: She is not in acute distress.    Appearance: She is well-developed.  HENT:     Head: Normocephalic and atraumatic.  Eyes:     Conjunctiva/sclera: Conjunctivae normal.  Cardiovascular:     Rate and Rhythm: Normal rate and regular rhythm.     Pulses:          Dorsalis pedis pulses are 2+ on the left side.       Posterior tibial pulses are 2+ on the left side.     Heart sounds: No murmur heard. Pulmonary:     Effort: Pulmonary effort is normal. No respiratory distress.     Breath sounds: Normal breath sounds.  Abdominal:     Palpations: Abdomen is soft.     Tenderness: There is no  abdominal tenderness.  Musculoskeletal:     Left lower leg: Edema present.     Comments: Hinged brace LLE, non pitting elema and ecchymosis around knee and thigh.  Skin:    General: Skin is warm.     Findings: No erythema or rash.  Neurological:     Mental Status: She is alert and oriented to person, place, and time.     Comments: Antalgic gait assisted with a walker.  Psychiatric:     Comments: Well groomed, good eye contact.   ASSESSMENT AND PLAN:  Ms.Gerica was seen today for hospitalization follow-up.  Diagnoses and all orders for this visit: Orders Placed This Encounter  Procedures   CBC   Basic metabolic panel   Lab Results  Component Value Date   WBC 9.3 04/22/2022   HGB 9.9 (L) 04/22/2022   HCT 29.1 (L) 04/22/2022   MCV 97.8 04/22/2022   PLT 428.0 (H) 04/22/2022   Lab Results  Component Value Date   CREATININE 0.82 04/22/2022   BUN 28 (H) 04/22/2022   NA 131 (L) 04/22/2022   K 4.1 04/22/2022   CL 97 04/22/2022   CO2 27 04/22/2022   Closed displaced oblique fracture of shaft of left femur with routine healing, subsequent encounter S/P ORIF left femoral shaft fracture and revision of total knee arthroplasty on 04/13/22. She is recovering well, still having moderate pain. She did not tolerate oxycodone. She agrees with trying tramadol 50 mg 3 times daily as needed, we discussed some side effects. Continue ambulating a few times throughout the day. Currently she is on heparin 5000 units twice daily for DVT prophylaxis but the plan is to change to Eliquis 2.5 mg twice daily twice. She has  an appt with new ortho next week.  -     traMADol (ULTRAM) 50 MG tablet; Take 1 tablet (50 mg total) by mouth every 8 (eight) hours as needed for up to 5 days.  Acute anemia H/H at the time of discharge was 7.9/22.7, s/p PRBC's transfusion x1. She is not on iron supplementation at this time, we will hold on this for now due to the risk for constipation. Further recommendation will  be given according to CBC results.  Hyponatremia Mild. Some of her medication could be contributing factors. We are obtaining BMP today and commendation will be given accordingly.  Essential hypertension BP adequately controlled. Continue HCTZ 12.5 mg daily and Amlodipine-benazepril 5-40 mg daily. Continue low salt diet.  I spent a total of 41 minutes in both face to face and non face to face activities for this visit on the date of this encounter. During this time history was obtained and documented, examination was performed, prior labs/imaging reviewed, and assessment/plan discussed. Called pt with lab results. She was instructed to stop Heparin after last dose tonight and start Eliquis 2.5 mg twice daily tomorrow at the next heparin schedule dose. She will keep appointment with orthopedic next week, who will determine for how long she is going to be on Eliquis, explained that she might be taking it for up to 30 days. See lab result note. She voices understanding and agrees with plan.  Acea Yagi G. [place], MD  [facility]. [place] office.

## 2022-04-22 ENCOUNTER — Encounter: Payer: Self-pay | Admitting: Family Medicine

## 2022-04-22 ENCOUNTER — Ambulatory Visit (INDEPENDENT_AMBULATORY_CARE_PROVIDER_SITE_OTHER): Payer: Medicare Other | Admitting: Family Medicine

## 2022-04-22 VITALS — BP 110/70 | HR 94 | Resp 12 | Ht 66.0 in

## 2022-04-22 DIAGNOSIS — I10 Essential (primary) hypertension: Secondary | ICD-10-CM | POA: Diagnosis not present

## 2022-04-22 DIAGNOSIS — S72332D Displaced oblique fracture of shaft of left femur, subsequent encounter for closed fracture with routine healing: Secondary | ICD-10-CM | POA: Diagnosis not present

## 2022-04-22 DIAGNOSIS — D649 Anemia, unspecified: Secondary | ICD-10-CM | POA: Diagnosis not present

## 2022-04-22 DIAGNOSIS — E871 Hypo-osmolality and hyponatremia: Secondary | ICD-10-CM

## 2022-04-22 LAB — BASIC METABOLIC PANEL
BUN: 28 mg/dL — ABNORMAL HIGH (ref 6–23)
CO2: 27 mEq/L (ref 19–32)
Calcium: 9 mg/dL (ref 8.4–10.5)
Chloride: 97 mEq/L (ref 96–112)
Creatinine, Ser: 0.82 mg/dL (ref 0.40–1.20)
GFR: 73.45 mL/min (ref >60.00)
Glucose, Bld: 97 mg/dL (ref 70–99)
Potassium: 4.1 mEq/L (ref 3.5–5.1)
Sodium: 131 mEq/L — ABNORMAL LOW (ref 135–145)

## 2022-04-22 LAB — CBC
HCT: 29.1 % — ABNORMAL LOW (ref 36.0–46.0)
Hemoglobin: 9.9 g/dL — ABNORMAL LOW (ref 12.0–15.0)
MCHC: 34.1 g/dL (ref 30.0–36.0)
MCV: 97.8 fl (ref 78.0–100.0)
Platelets: 428 10*3/uL — ABNORMAL HIGH (ref 150.0–400.0)
RBC: 2.98 Mil/uL — ABNORMAL LOW (ref 3.87–5.11)
RDW: 15.4 % (ref 11.5–15.5)
WBC: 9.3 10*3/uL (ref 4.0–10.5)

## 2022-04-22 MED ORDER — TRAMADOL HCL 50 MG PO TABS: 50.0000 mg | ORAL_TABLET | Freq: Three times a day (TID) | ORAL | 0 refills | Status: AC | PRN

## 2022-04-22 NOTE — Patient Instructions (Signed)
A few things to remember from today's visit:  Closed displaced oblique fracture of shaft of left femur with routine healing, subsequent encounter - Plan: traMADol (ULTRAM) 50 MG tablet  Acute anemia - Plan: CBC  Hyponatremia - Plan: Basic metabolic panel  If you need refills please call your pharmacy. Do not use My Chart to request refills or for acute issues that need immediate attention.   Tramadol to take up to 3 times per day. Continue walking through the day. Keep appt with ortho.  Please be sure medication list is accurate. If a new problem present, please set up appointment sooner than planned today.

## 2022-04-26 DIAGNOSIS — S72325A Nondisplaced transverse fracture of shaft of left femur, initial encounter for closed fracture: Secondary | ICD-10-CM | POA: Diagnosis not present

## 2022-05-02 DIAGNOSIS — M25552 Pain in left hip: Secondary | ICD-10-CM | POA: Diagnosis not present

## 2022-05-06 DIAGNOSIS — M25552 Pain in left hip: Secondary | ICD-10-CM | POA: Diagnosis not present

## 2022-05-09 DIAGNOSIS — M25552 Pain in left hip: Secondary | ICD-10-CM | POA: Diagnosis not present

## 2022-05-11 DIAGNOSIS — M25552 Pain in left hip: Secondary | ICD-10-CM | POA: Diagnosis not present

## 2022-05-18 DIAGNOSIS — M25552 Pain in left hip: Secondary | ICD-10-CM | POA: Diagnosis not present

## 2022-05-20 DIAGNOSIS — M25552 Pain in left hip: Secondary | ICD-10-CM | POA: Diagnosis not present

## 2022-05-23 DIAGNOSIS — M25552 Pain in left hip: Secondary | ICD-10-CM | POA: Diagnosis not present

## 2022-05-25 DIAGNOSIS — M25552 Pain in left hip: Secondary | ICD-10-CM | POA: Diagnosis not present

## 2022-05-27 DIAGNOSIS — S72302A Unspecified fracture of shaft of left femur, initial encounter for closed fracture: Secondary | ICD-10-CM | POA: Diagnosis not present

## 2022-07-11 DIAGNOSIS — S72302A Unspecified fracture of shaft of left femur, initial encounter for closed fracture: Secondary | ICD-10-CM | POA: Diagnosis not present

## 2022-07-22 ENCOUNTER — Encounter: Payer: Self-pay | Admitting: Family Medicine

## 2022-07-22 ENCOUNTER — Telehealth: Payer: Self-pay | Admitting: Family Medicine

## 2022-07-22 NOTE — Telephone Encounter (Addendum)
Pt called to say she thinks she may have a UTI  Pt is requesting MD send her an Rx [facility]:  [id number] [place] [address]: [phone] Fax: [phone]    LOV:  04/22/22

## 2022-07-22 NOTE — Telephone Encounter (Signed)
Has to have an appointment.

## 2022-07-25 NOTE — Telephone Encounter (Signed)
Pt states she's been drinking plenty of water and cranberry juice and symptoms seem to be improving.  Pt stated that she will call back for an appointment if symptoms return or worsen.

## 2022-08-03 DIAGNOSIS — D1801 Hemangioma of skin and subcutaneous tissue: Secondary | ICD-10-CM | POA: Diagnosis not present

## 2022-08-03 DIAGNOSIS — L814 Other melanin hyperpigmentation: Secondary | ICD-10-CM | POA: Diagnosis not present

## 2022-08-03 DIAGNOSIS — L821 Other seborrheic keratosis: Secondary | ICD-10-CM | POA: Diagnosis not present

## 2022-08-03 DIAGNOSIS — D2261 Melanocytic nevi of right upper limb, including shoulder: Secondary | ICD-10-CM | POA: Diagnosis not present

## 2022-08-03 DIAGNOSIS — L820 Inflamed seborrheic keratosis: Secondary | ICD-10-CM | POA: Diagnosis not present

## 2022-08-11 ENCOUNTER — Other Ambulatory Visit: Payer: Self-pay | Admitting: Family Medicine

## 2022-08-11 DIAGNOSIS — Z1231 Encounter for screening mammogram for malignant neoplasm of breast: Secondary | ICD-10-CM

## 2022-08-11 NOTE — Progress Notes (Signed)
Office Visit Note  Patient: Mercedes Hodge             Date of Birth: Oct 19, 1955           MRN: [id number]             PCP: Martinique, Betty G, MD Referring: Martinique, Betty G, MD Visit Date: 08/23/2022 Occupation: '@GUAROCC'$ @  Subjective:  Medication management  History of Present Illness: Mercedes Hodge is a 66 y.o. female history of seronegative rheumatoid Tritus and osteoarthritis.  She states she was visiting her grandson in [place].  She had a fall and fractured her left femur.  She underwent surgery for left femur and also had a revision on her left knee joint which was replaced.  She did well after the surgery.  Still have some soreness in her left leg.  Has been having increased pain in her left CMC joint.  None of the other joints are painful or swollen.  Trochanteric bursitis resolved.  Her right ankle replacement is doing well.  She has been going to water aerobics 3 times a week.  Activities of Daily Living:  Patient reports morning stiffness for 1 hour.   Patient Denies nocturnal pain.  Difficulty dressing/grooming: Denies Difficulty climbing stairs: Denies Difficulty getting out of chair: Denies Difficulty using hands for taps, buttons, cutlery, and/or writing: Denies  Review of Systems  Constitutional:  Negative for fatigue.  HENT:  Negative for mouth sores and mouth dryness.   Eyes:  Negative for redness.  Respiratory:  Negative for shortness of breath.   Cardiovascular:  Negative for chest pain and palpitations.  Gastrointestinal:  Positive for constipation. Negative for abdominal pain and diarrhea.  Endocrine: Negative for increased urination.  Genitourinary:  Negative for difficulty urinating.  Musculoskeletal:  Positive for joint pain, joint pain and morning stiffness. Negative for gait problem, myalgias and myalgias.  Skin:  Negative for color change, rash and sensitivity to sunlight.  Allergic/Immunologic: Negative for susceptible to infections.   Neurological:  Negative for dizziness, headaches and weakness.  Hematological:  Positive for swollen glands.  Psychiatric/Behavioral:  Negative for depressed mood and sleep disturbance. The patient is not nervous/anxious.     PMFS History:  Patient Active Problem List   Diagnosis Date Noted   Ganglion cyst of flexor tendon sheath 03/04/2022   Depression, major, in remission ([place]) 06/29/2021   Aortic atherosclerosis ([place]) 08/18/2020   History of colonic polyps 07/20/2020   Atrophic vaginitis 02/17/2020   Rheumatoid arthritis of multiple sites with negative rheumatoid factor ([place]) 07/02/2019   High risk medication use 07/02/2019   Primary osteoarthritis of both hands 07/02/2019   Status post total bilateral knee replacement 07/02/2019   Status post right ankle joint replacement 07/02/2019   Essential hypertension 07/02/2019   Asthma, intermittent, uncomplicated 25/85/2778   History of thyroid cancer 07/02/2019   Postoperative hypothyroidism 07/02/2019   Dyslipidemia 07/02/2019   Gastroesophageal reflux disease without esophagitis 07/02/2019   Osteopenia of multiple sites 07/02/2019    Past Medical History:  Diagnosis Date   Allergy    Arthritis    Asthma    GERD (gastroesophageal reflux disease)    Hyperlipidemia    Hypertension    Thyroid cancer ([place])    thyroid   Thyroid disease    Postsurgical hypothyroidism    Family History  Problem Relation Age of Onset   COPD Mother    Heart disease Mother    Cancer Father        thyroid  cancer   Juvenile Rhematoid Arthritis Sister    Stroke Sister    Thyroid cancer Brother    Thyroid cancer Daughter    Healthy Daughter    Healthy Son    Healthy Daughter    Colon cancer Neg Hx    Esophageal cancer Neg Hx    Pancreatic cancer Neg Hx    Stomach cancer Neg Hx    Liver disease Neg Hx    Past Surgical History:  Procedure Laterality Date   ABDOMINAL HYSTERECTOMY  1993   CHOLECYSTECTOMY  1982   EXTRACORPOREAL SHOCK WAVE  LITHOTRIPSY Left 09/17/2020   Procedure: EXTRACORPOREAL SHOCK WAVE LITHOTRIPSY (ESWL);  Surgeon: Franchot Gallo, MD;  [facility];  Service: Urology;  Laterality: Left;   LAPAROSCOPIC GASTRIC SLEEVE RESECTION  07/2018   left knee replacement  2008   ORIF FEMUR FRACTURE  04/13/2022   partial knee revision  04/13/2022   right ankle replacement  2012   right knee replacement  2009   trimallar FX  1995   right ankle   Social History   Social History Narrative   Not on file   Immunization History  Administered Date(s) Administered   Fluad Quad(high Dose 65+) 06/29/2021   Influenza-Unspecified 05/22/2019, 06/10/2020   PFIZER Comirnaty(Gray Top)Covid-19 Tri-Sucrose Vaccine 05/08/2021   PFIZER(Purple Top)SARS-COV-2 Vaccination 11/17/2019, 12/08/2019, 06/10/2020   Pfizer Covid-19 Vaccine Bivalent Booster 33yr & up 08/19/2021   Pneumococcal Conjugate-13 06/07/2019   Pneumococcal Polysaccharide-23 08/18/2020   Tdap 06/07/2019   Zoster Recombinat (Shingrix) 12/23/2020, 02/27/2021     Objective: Vital Signs: BP 99/64 (BP Location: Left Arm, Patient Position: Sitting, Cuff Size: Normal)   Pulse 69   Resp 17   Ht '5\' 5"'$  (1.651 m)   Wt 191 lb (86.6 kg)   BMI 31.78 kg/m    Physical Exam Vitals and nursing note reviewed.  Constitutional:      Appearance: She is well-developed.  HENT:     Head: Normocephalic and atraumatic.  Eyes:     Conjunctiva/sclera: Conjunctivae normal.  Cardiovascular:     Rate and Rhythm: Normal rate and regular rhythm.     Heart sounds: Normal heart sounds.  Pulmonary:     Effort: Pulmonary effort is normal.     Breath sounds: Normal breath sounds.  Abdominal:     General: Bowel sounds are normal.     Palpations: Abdomen is soft.  Musculoskeletal:     Cervical back: Normal range of motion.  Lymphadenopathy:     Cervical: No cervical adenopathy.  Skin:    General: Skin is warm and dry.     Capillary Refill: Capillary  refill takes less than 2 seconds.  Neurological:     Mental Status: She is alert and oriented to person, place, and time.  Psychiatric:        Behavior: Behavior normal.      Musculoskeletal Exam: Cervical spine was in good range of motion.  She had good range of motion of thoracic and lumbar spine.  Shoulder joints, elbow joints, wrist joints with good range of motion.  She had bilateral CMC PIP and DIP thickening with mild synovitis.  She had tenderness over left CMC joint.  Hip joints and knee joints with good range of motion.  She had warmth on palpation of her left knee joint.  She had limited range of motion of her right ankle joint which is replaced and thickened.  There was no tenderness over ankles or MTPs.  CDAI Exam: CDAI  Score: -- Patient Global: 1 mm; Provider Global: 1 mm Swollen: --; Tender: -- Joint Exam 08/23/2022   No joint exam has been documented for this visit   There is currently no information documented on the homunculus. Go to the Rheumatology activity and complete the homunculus joint exam.  Investigation: No additional findings.  Imaging: No results found.  Recent Labs: Lab Results  Component Value Date   WBC 9.3 04/22/2022   HGB 9.9 (L) 04/22/2022   PLT 428.0 (H) 04/22/2022   NA 131 (L) 04/22/2022   K 4.1 04/22/2022   CL 97 04/22/2022   CO2 27 04/22/2022   GLUCOSE 97 04/22/2022   BUN 28 (H) 04/22/2022   CREATININE 0.82 04/22/2022   BILITOT 0.5 03/01/2022   AST 25 03/01/2022   ALT 14 03/01/2022   PROT 6.9 03/01/2022   CALCIUM 9.0 04/22/2022   GFRAA 93 11/27/2020    Speciality Comments: Plaquenil eye exam: 01/19/22  [facility] Follow up in 1 year  Procedures:  No procedures performed Allergies: Lovenox [enoxaparin sodium]   Assessment / Plan:     Visit Diagnoses: Rheumatoid arthritis of multiple sites with negative rheumatoid factor (HCC) + anti-CCP - Diagnosed in [place].  She has been doing well on hydroxychloroquine  200 mg p.o. twice daily.  She denies any flares of rheumatoid arthritis.  She has been experiencing pain and discomfort in the left CMC joint.  No synovitis was noted.  High risk medication use - Plaquenil 200 mg 1 tablet p.o. twice daily. Plaquenil eye exam: 5/10/223 -Labs obtained on April 22, 2022 were reviewed.  Her hemoglobin was low at 9.9 due to recent surgery.  Platelets were high due to anemia.  CMP was normal and June 2023.  She was advised to get labs today and then in May.  Her last eye examination was Jan 19, 2022 which was within normal limits.  Annual eye examination was advised.  Plan: CBC with Differential/Platelet, COMPLETE METABOLIC PANEL WITH GFR.  Information regarding immunization was placed in the AVS.  Primary osteoarthritis of both hands-she continues to have pain and discomfort in her bilateral hands.  She has been having increased pain in the left [facility] joint.  Use of CMC brace was advised.  Topical Voltaren gel was advised.  I also discussed possible cortisone injection in the future if she has increased pain and discomfort.  She would like to hold off cortisone injection for now.  Ganglion cyst of flexor tendon sheath of finger of right hand-patient states that the ganglion cyst recurred.  She does not want to have surgery at this time.  Trigger finger, right middle finger - resolved.  Status post closed fracture of left femur-she fell in February 2023 while she was in [place].  She had open reduction internal fixation of the right femur .  She is followed by Dr. Lyla Glassing here locally.  Trochanteric bursitis of both hips-she has intermittent discomfort.  She denies any discomfort today.  Status post total bilateral knee replacement-she had right total knee replacement in 2009 and left total knee replacement in 2008.  She had partial revision of left total knee replacement after the femur fracture.  Status post right ankle joint replacement - Due to Charcot joint.  She  had thickening of the ankle joint with limited range of motion.  Osteopenia of multiple sites - T score -1.11 Jan 2020.  Use of calcium and vitamin D was discussed.  Will schedule repeat DEXA scan.  Essential hypertension  Dyslipidemia  Gastroesophageal reflux disease without esophagitis  History of asthma  History of thyroid cancer  Orders: Orders Placed This Encounter  Procedures   CBC with Differential/Platelet   COMPLETE METABOLIC PANEL WITH GFR   No orders of the defined types were placed in this encounter.   Follow-Up Instructions: Return in about 5 months (around 01/22/2023) for Rheumatoid arthritis.   Bo Merino, MD  Note - This record has been created using Editor, commissioning.  Chart creation errors have been sought, but may not always  have been located. Such creation errors do not reflect on  the standard of medical care.

## 2022-08-23 ENCOUNTER — Ambulatory Visit: Payer: Medicare Other | Attending: Rheumatology | Admitting: Rheumatology

## 2022-08-23 ENCOUNTER — Encounter: Payer: Self-pay | Admitting: Rheumatology

## 2022-08-23 VITALS — BP 99/64 | HR 69 | Resp 17 | Ht 65.0 in | Wt 191.0 lb

## 2022-08-23 DIAGNOSIS — Z8781 Personal history of (healed) traumatic fracture: Secondary | ICD-10-CM

## 2022-08-23 DIAGNOSIS — Z96661 Presence of right artificial ankle joint: Secondary | ICD-10-CM

## 2022-08-23 DIAGNOSIS — M0609 Rheumatoid arthritis without rheumatoid factor, multiple sites: Secondary | ICD-10-CM | POA: Diagnosis not present

## 2022-08-23 DIAGNOSIS — M8589 Other specified disorders of bone density and structure, multiple sites: Secondary | ICD-10-CM

## 2022-08-23 DIAGNOSIS — Z8709 Personal history of other diseases of the respiratory system: Secondary | ICD-10-CM

## 2022-08-23 DIAGNOSIS — Z96653 Presence of artificial knee joint, bilateral: Secondary | ICD-10-CM

## 2022-08-23 DIAGNOSIS — M65331 Trigger finger, right middle finger: Secondary | ICD-10-CM

## 2022-08-23 DIAGNOSIS — M7061 Trochanteric bursitis, right hip: Secondary | ICD-10-CM | POA: Diagnosis not present

## 2022-08-23 DIAGNOSIS — Z79899 Other long term (current) drug therapy: Secondary | ICD-10-CM | POA: Diagnosis not present

## 2022-08-23 DIAGNOSIS — K219 Gastro-esophageal reflux disease without esophagitis: Secondary | ICD-10-CM

## 2022-08-23 DIAGNOSIS — M67441 Ganglion, right hand: Secondary | ICD-10-CM

## 2022-08-23 DIAGNOSIS — Z85850 Personal history of malignant neoplasm of thyroid: Secondary | ICD-10-CM

## 2022-08-23 DIAGNOSIS — E785 Hyperlipidemia, unspecified: Secondary | ICD-10-CM

## 2022-08-23 DIAGNOSIS — I10 Essential (primary) hypertension: Secondary | ICD-10-CM | POA: Diagnosis not present

## 2022-08-23 DIAGNOSIS — M19041 Primary osteoarthritis, right hand: Secondary | ICD-10-CM | POA: Diagnosis not present

## 2022-08-23 DIAGNOSIS — M19042 Primary osteoarthritis, left hand: Secondary | ICD-10-CM

## 2022-08-23 DIAGNOSIS — M7062 Trochanteric bursitis, left hip: Secondary | ICD-10-CM

## 2022-08-23 NOTE — Addendum Note (Signed)
Addended by: Earnestine Mealing on: 08/23/2022 02:13 PM   Modules accepted: Orders

## 2022-08-23 NOTE — Patient Instructions (Signed)
Standing Labs We placed an order today for your standing lab work.   Please have your standing labs drawn in May  Please have your labs drawn 2 weeks prior to your appointment so that the provider can discuss your lab results at your appointment.  Please note that you may see your imaging and lab results in [facility] before we have reviewed them. We will contact you once all results are reviewed. Please allow our office up to 72 hours to thoroughly review all of the results before contacting the office for clarification of your results.  Lab hours are:   Monday through Thursday from 8:00 am -12:30 pm and 1:00 pm-5:00 pm and Friday from 8:00 am-12:00 pm.  Please be advised, all patients with office appointments requiring lab work will take precedent over walk-in lab work.   Labs are drawn by [facility]. Please bring your co-pay at the time of your lab draw.  You may receive a bill from [facility] for your lab work.  Please note if you are on Hydroxychloroquine and and an order has been placed for a Hydroxychloroquine level, you will need to have it drawn 4 hours or more after your last dose.  If you wish to have your labs drawn at another location, please call the office 24 hours in advance so we can fax the orders.  The office is located at [address], [place], [place],  [address] No appointment is necessary.    If you have any questions regarding directions or hours of operation,  please call [phone].   As a reminder, please drink plenty of water prior to coming for your lab work. Thanks!   Vaccines You are taking a medication(s) that can suppress your immune system.  The following immunizations are recommended: Flu annually Covid-19  RSV Td/Tdap (tetanus, diphtheria, pertussis) every 10 years Pneumonia (Prevnar 15 then Pneumovax 23 at least 1 year apart.  Alternatively, can take Prevnar 20 without needing additional dose) Shingrix: 2 doses from 4 weeks to 6 months  apart  Please check with your PCP to make sure you are up to date.

## 2022-08-24 LAB — CBC WITH DIFFERENTIAL/PLATELET
Absolute Monocytes: 752 cells/uL (ref 200–950)
Basophils Absolute: 53 cells/uL (ref 0–200)
Basophils Relative: 0.7 %
Eosinophils Absolute: 122 cells/uL (ref 15–500)
Eosinophils Relative: 1.6 %
HCT: 38.1 % (ref 35.0–45.0)
Hemoglobin: 13.1 g/dL (ref 11.7–15.5)
Lymphs Abs: 1467 cells/uL (ref 850–3900)
MCH: 32.8 pg (ref 27.0–33.0)
MCHC: 34.4 g/dL (ref 32.0–36.0)
MCV: 95.3 fL (ref 80.0–100.0)
MPV: 9.7 fL (ref 7.5–12.5)
Monocytes Relative: 9.9 %
Neutro Abs: 5206 cells/uL (ref 1500–7800)
Neutrophils Relative %: 68.5 %
Platelets: 218 10*3/uL (ref 140–400)
RBC: 4 10*6/uL (ref 3.80–5.10)
RDW: 13 % (ref 11.0–15.0)
Total Lymphocyte: 19.3 %
WBC: 7.6 10*3/uL (ref 3.8–10.8)

## 2022-08-24 LAB — COMPLETE METABOLIC PANEL WITH GFR
AG Ratio: 1.5 (calc) (ref 1.0–2.5)
ALT: 14 U/L (ref 6–29)
AST: 20 U/L (ref 10–35)
Albumin: 4.1 g/dL (ref 3.6–5.1)
Alkaline phosphatase (APISO): 64 U/L (ref 37–153)
BUN: 23 mg/dL (ref 7–25)
CO2: 25 mmol/L (ref 20–32)
Calcium: 9.6 mg/dL (ref 8.6–10.4)
Chloride: 99 mmol/L (ref 98–110)
Creat: 0.93 mg/dL (ref 0.50–1.05)
Globulin: 2.7 g/dL (calc) (ref 1.9–3.7)
Glucose, Bld: 98 mg/dL (ref 65–99)
Potassium: 3.8 mmol/L (ref 3.5–5.3)
Sodium: 133 mmol/L — ABNORMAL LOW (ref 135–146)
Total Bilirubin: 0.6 mg/dL (ref 0.2–1.2)
Total Protein: 6.8 g/dL (ref 6.1–8.1)
eGFR: 67 mL/min/{1.73_m2} (ref >=60)

## 2022-08-24 NOTE — Progress Notes (Signed)
CBC and CMP are normal except sodium is mildly decreased but improved.

## 2022-08-30 ENCOUNTER — Telehealth: Payer: Self-pay | Admitting: Family Medicine

## 2022-08-30 DIAGNOSIS — E2839 Other primary ovarian failure: Secondary | ICD-10-CM

## 2022-08-30 NOTE — Telephone Encounter (Signed)
Pt needs bone density test and they are unable to order. Pt need to be on the same machine she has the last one done at [place].. Please put order in system

## 2022-08-30 NOTE — Telephone Encounter (Signed)
Order placed

## 2022-09-12 ENCOUNTER — Encounter: Payer: Self-pay | Admitting: Rheumatology

## 2022-09-12 ENCOUNTER — Encounter: Payer: Self-pay | Admitting: Family Medicine

## 2022-09-28 ENCOUNTER — Telehealth: Payer: Self-pay | Admitting: Family Medicine

## 2022-09-28 ENCOUNTER — Telehealth: Payer: Medicare Other

## 2022-09-28 MED ORDER — PRAVASTATIN SODIUM 40 MG PO TABS: 40.0000 mg | ORAL_TABLET | Freq: Every day | ORAL | 2 refills | Status: DC

## 2022-09-28 NOTE — Telephone Encounter (Signed)
Rx sent 

## 2022-09-28 NOTE — Telephone Encounter (Signed)
Pt is calling and md has increase her medication to 40 mg and she needs new rx for pravastatin (PRAVACHOL) 40 MG tablet  [place], [place] Phone: [phone]  Fax: [phone]

## 2022-09-30 NOTE — Telephone Encounter (Signed)
Has appt Monday.

## 2022-09-30 NOTE — Progress Notes (Unsigned)
ACUTE VISIT Chief Complaint  Patient presents with   Medication Refill   HPI: Mercedes Hodge is a 67 y.o. female, who is here today complaining of *** HPI using vaginal cream for urinary tract symptoms, which she believes may be related to dry vaginal lining. She has not used the cream for two years but recently contacted the provider due to occasional burning sensations, similar to the onset of a UTI. The patient denies any vaginal bleeding or discharge and states that the symptoms resolve quickly upon using the cream.  The patient has a history of a broken femur and damaged knee replacement, which have since healed. She has been referred to Dr. Colletta [place] ***for rheumatoid arthritis and was advised to undergo a bone density scan. The patient's last bone density scan was in 2021, which showed osteopenia.    She received her last COVID vaccine in September. She has not had any recent urinary symptoms but used the cream three weeks ago when symptoms occurred. The patient reports that her blood pressure has been on the lower side, sometimes in the 90s, but she has not experienced any dizziness. She has lost weight, which has helped with her blood pressure management.  The patient is currently taking amlodipine-Benazepril ***, and hydrochlorothiazide 12.5 mg daily for blood pressure management. She has noticed that her blood pressure readings have been lower since August, with some readings in the 100's/50s.  Negative for  severe/frequent headache, visual changes, chest pain, dyspnea, palpitation, claudication, focal weakness, or edema.  Lab Results  Component Value Date   CREATININE 0.93 08/23/2022   BUN 23 08/23/2022   NA 133 (L) 08/23/2022   K 3.8 08/23/2022   CL 99 08/23/2022   CO2 25 08/23/2022   Review of Systems  Constitutional:  Negative for chills and fever.  HENT:  Negative for mouth sores and sore throat.   Respiratory:  Negative for cough and wheezing.    Gastrointestinal:  Negative for abdominal pain, nausea and vomiting.  Neurological:  Negative for syncope and facial asymmetry.  See other pertinent positives and negatives in HPI.  Current Outpatient Medications on File Prior to Visit  Medication Sig Dispense Refill   amLODipine-benazepril (LOTREL) 5-40 MG capsule TAKE 1 CAPSULE BY MOUTH  DAILY 100 capsule 2   calcium carbonate (OS-CAL) 600 MG tablet Take 600 mg by mouth daily.     Cholecalciferol (VITAMIN D) 50 MCG (2000 UT) tablet Take 2,000 Units by mouth daily.     diclofenac sodium (VOLTAREN) 1 % GEL Apply topically 4 (four) times daily.     fluticasone (FLONASE) 50 MCG/ACT nasal spray Place into both nostrils as needed for allergies or rhinitis.     hydroxychloroquine (PLAQUENIL) 200 MG tablet TAKE 1 TABLET BY MOUTH TWICE  DAILY 180 tablet 3   levothyroxine (SYNTHROID) 137 MCG tablet Take 1 tablet by mouth daily except take 1/2 tablet on Tuesdays & Thursdays. 90 tablet 2   pramoxine-hydrocortisone (PROCTOCREAM-HC) 1-1 % rectal cream Place 1 application rectally 2 (two) times daily as needed for hemorrhoids or anal itching. 30 g 1   pravastatin (PRAVACHOL) 40 MG tablet Take 1 tablet (40 mg total) by mouth daily. 90 tablet 2   SYMBICORT 160-4.5 MCG/ACT inhaler USE 2 INHALATIONS BY MOUTH TWICE DAILY 30.6 g 3   No current facility-administered medications on file prior to visit.   Past Medical History:  Diagnosis Date   Allergy    Arthritis    Asthma    GERD (  gastroesophageal reflux disease)    Hyperlipidemia    Hypertension    Thyroid cancer (HCC)    thyroid   Thyroid disease    Postsurgical hypothyroidism   Allergies  Allergen Reactions   Lovenox [Enoxaparin Sodium] Rash    rash    Social History   Socioeconomic History   Marital status: Married    Spouse name: Not on file   Number of children: Not on file   Years of education: Not on file   Highest education level: Not on file  Occupational History   Not on file   Tobacco Use   Smoking status: Never    Passive exposure: Past   Smokeless tobacco: Never  Vaping Use   Vaping Use: Never used  Substance and Sexual Activity   Alcohol use: Yes    Comment: occ wine    Drug use: Never   Sexual activity: Yes    Birth control/protection: None  Other Topics Concern   Not on file  Social History Narrative   Not on file   Social Determinants of Health   Financial Resource Strain: Low Risk  (10/04/2021)   Overall Financial Resource Strain (CARDIA)    Difficulty of Paying Living Expenses: Not hard at all  Food Insecurity: No Food Insecurity (10/04/2021)   Hunger Vital Sign    Worried About Running Out of Food in the Last Year: Never true    Ran Out of Food in the Last Year: Never true  Transportation Needs: No Transportation Needs (10/04/2021)   PRAPARE - Hydrologist (Medical): No    Lack of Transportation (Non-Medical): No  Physical Activity: Sufficiently Active (10/04/2021)   Exercise Vital Sign    Days of Exercise per Week: 3 days    Minutes of Exercise per Session: 120 min  Stress: No Stress Concern Present (10/04/2021)   [place]    Feeling of Stress : Not at all  Social Connections: Moderately Integrated (10/04/2021)   Social Connection and Isolation Panel [NHANES]    Frequency of Communication with Friends and Family: More than three times a week    Frequency of Social Gatherings with Friends and Family: More than three times a week    Attends Religious Services: Never    Marine scientist or Organizations: Yes    Attends Music therapist: More than 4 times per year    Marital Status: Married   Vitals:   10/03/22 1359  BP: 110/68  Pulse: 85  Resp: 16  Temp: 98.2 F (36.8 C)  SpO2: 98%   Body mass index is 32.12 kg/m.  Physical Exam Vitals and nursing note reviewed.  Constitutional:      General: She is not in acute  distress.    Appearance: She is well-developed.  HENT:     Head: Normocephalic and atraumatic.  Eyes:     Conjunctiva/sclera: Conjunctivae normal.  Cardiovascular:     Rate and Rhythm: Normal rate and regular rhythm.     Pulses:          Dorsalis pedis pulses are 2+ on the right side and 2+ on the left side.     Heart sounds: No murmur heard. Pulmonary:     Effort: Pulmonary effort is normal. No respiratory distress.     Breath sounds: Normal breath sounds.  Abdominal:     Palpations: Abdomen is soft. There is no hepatomegaly or mass.  Tenderness: There is no abdominal tenderness.  Skin:    General: Skin is warm.     Findings: No erythema or rash.  Neurological:     General: No focal deficit present.     Mental Status: She is alert and oriented to person, place, and time.     Cranial Nerves: No cranial nerve deficit.     Gait: Gait normal.  Psychiatric:        Mood and Affect: Mood and affect normal.   ASSESSMENT AND PLAN:  Ms. Malanga is a 67 yo female seen today for recurrent dysuria, atrophic vaginitis,and HTN.  Atrophic vaginitis Assessment & Plan: Topical Estradiol vaginal cream has helped, so she can resume. Recommend using it regularly instead prn, weekly. We discussed some side effects.   Orders: -     Estradiol; Place 1 Applicatorful vaginally once a week.  Dispense: 42.5 g; Refill: 3  Essential hypertension Assessment & Plan: Some SBP's mildly low, so discontinue HCTZ 12.5 mg. Continue amlodipine, and benazepril. Continue monitoring BP at home regularly and following low-salt diet.   Rheumatoid arthritis of multiple sites with negative rheumatoid factor (HCC) Assessment & Plan: Currently on Hydroxychloroquine 200 mg bid. Follows with rheumatologist.   Return if symptoms worsen or fail to improve, for keep next appointment.  Aniyiah Zell G. [place], MD  [facility]. [place] office.

## 2022-10-01 ENCOUNTER — Other Ambulatory Visit: Payer: Self-pay | Admitting: Family Medicine

## 2022-10-03 ENCOUNTER — Ambulatory Visit (INDEPENDENT_AMBULATORY_CARE_PROVIDER_SITE_OTHER): Payer: Medicare Other | Admitting: Family Medicine

## 2022-10-03 ENCOUNTER — Encounter: Payer: Self-pay | Admitting: Family Medicine

## 2022-10-03 ENCOUNTER — Telehealth: Payer: Self-pay | Admitting: Family Medicine

## 2022-10-03 VITALS — BP 110/68 | HR 85 | Temp 98.2°F | Resp 16 | Ht 65.0 in | Wt 193.0 lb

## 2022-10-03 DIAGNOSIS — I10 Essential (primary) hypertension: Secondary | ICD-10-CM

## 2022-10-03 DIAGNOSIS — M0609 Rheumatoid arthritis without rheumatoid factor, multiple sites: Secondary | ICD-10-CM | POA: Diagnosis not present

## 2022-10-03 DIAGNOSIS — N952 Postmenopausal atrophic vaginitis: Secondary | ICD-10-CM | POA: Diagnosis not present

## 2022-10-03 DIAGNOSIS — Z13820 Encounter for screening for osteoporosis: Secondary | ICD-10-CM

## 2022-10-03 MED ORDER — ESTRADIOL 0.1 MG/GM VA CREA: 1.0000 | TOPICAL_CREAM | VAGINAL | 3 refills | Status: AC

## 2022-10-03 NOTE — Telephone Encounter (Signed)
Pt called to ask if MD could create the referral for the Bone Density and when can she call them to schedule?  Please advise.  [phone]

## 2022-10-03 NOTE — Assessment & Plan Note (Signed)
Currently on Hydroxychloroquine 200 mg bid. Follows with rheumatologist. DEXA order has already being placed.

## 2022-10-03 NOTE — Assessment & Plan Note (Signed)
Some SBP's mildly low, so discontinue HCTZ 12.5 mg. Continue amlodipine- benazepril 5-40 mg daily and low salt diet. Continue monitoring BP at home regularly. Eye exam is current.

## 2022-10-03 NOTE — Patient Instructions (Signed)
A few things to remember from today's visit:  Vaginal atrophy  Essential hypertension Stop HCTZ due to low BP's. Estradiol vaginal cream once per week.  No changes in rest.  If you need refills for medications you take chronically, please call your pharmacy. Do not use My Chart to request refills or for acute issues that need immediate attention. If you send a my chart message, it may take a few days to be addressed, specially if I am not in the office.  Please be sure medication list is accurate. If a new problem present, please set up appointment sooner than planned today.

## 2022-10-03 NOTE — Telephone Encounter (Signed)
Okay to order Dexa?

## 2022-10-03 NOTE — Assessment & Plan Note (Signed)
Topical Estradiol vaginal cream has helped, so she can resume. Recommend using it regularly instead prn, weekly. We discussed some side effects.

## 2022-10-05 ENCOUNTER — Ambulatory Visit (INDEPENDENT_AMBULATORY_CARE_PROVIDER_SITE_OTHER)
Admission: RE | Admit: 2022-10-05 | Discharge: 2022-10-05 | Disposition: A | Discharge status: Home / Self Care | Payer: Medicare Other | Source: Ambulatory Visit | Attending: Family Medicine | Admitting: Family Medicine

## 2022-10-05 ENCOUNTER — Ambulatory Visit
Admission: RE | Admit: 2022-10-05 | Discharge: 2022-10-05 | Disposition: A | Discharge status: Home / Self Care | Payer: Medicare Other | Source: Ambulatory Visit | Attending: Family Medicine | Admitting: Family Medicine

## 2022-10-05 DIAGNOSIS — Z13820 Encounter for screening for osteoporosis: Secondary | ICD-10-CM

## 2022-10-05 DIAGNOSIS — M8588 Other specified disorders of bone density and structure, other site: Secondary | ICD-10-CM | POA: Diagnosis not present

## 2022-10-05 DIAGNOSIS — Z1231 Encounter for screening mammogram for malignant neoplasm of breast: Secondary | ICD-10-CM | POA: Diagnosis not present

## 2022-10-11 DIAGNOSIS — S72302A Unspecified fracture of shaft of left femur, initial encounter for closed fracture: Secondary | ICD-10-CM | POA: Diagnosis not present

## 2022-10-12 ENCOUNTER — Ambulatory Visit (INDEPENDENT_AMBULATORY_CARE_PROVIDER_SITE_OTHER): Payer: Medicare Other

## 2022-10-12 VITALS — Ht 65.0 in | Wt 193.0 lb

## 2022-10-12 DIAGNOSIS — Z0000 Encounter for general adult medical examination without abnormal findings: Secondary | ICD-10-CM

## 2022-10-12 NOTE — Patient Instructions (Addendum)
Mercedes Hodge , Thank you for taking time to come for your Medicare Wellness Visit. I appreciate your ongoing commitment to your health goals. Please review the following plan we discussed and let me know if I can assist you in the future.   These are the goals we discussed:  Goals       Patient Stated      Patient will continue to do water exercises.      Patient Stated (pt-stated)      10/12/22 I would like to lose 20 pounds.        This is a list of the screening recommended for you and due dates:  Health Maintenance  Topic Date Due   COVID-19 Vaccine (7 - 2023-24 season) 08/22/2022   Mammogram  10/06/2023   Medicare Annual Wellness Visit  10/13/2023   Colon Cancer Screening  12/18/2023   DTaP/Tdap/Td vaccine (2 - Td or Tdap) 06/06/2029   Pneumonia Vaccine  Completed   Flu Shot  Completed   DEXA scan (bone density measurement)  Completed   Hepatitis C Screening: USPSTF Recommendation to screen - Ages 61-79 yo.  Completed   Zoster (Shingles) Vaccine  Completed   HPV Vaccine  Aged Out    Advanced directives: yes patient to bring copy.  Conditions/risks identified: none  Next appointment: Follow up in one year for your annual wellness visit 10/16/2023 @ 1:30 VIA Telephone.   Preventive Care 24 Years and Older, Female Preventive care refers to lifestyle choices and visits with your health care provider that can promote health and wellness. What does preventive care include? A yearly physical exam. This is also called an annual well check. Dental exams once or twice a year. Routine eye exams. Ask your health care provider how often you should have your eyes checked. Personal lifestyle choices, including: Daily care of your teeth and gums. Regular physical activity. Eating a healthy diet. Avoiding tobacco and drug use. Limiting alcohol use. Practicing safe sex. Taking low-dose aspirin every day. Taking vitamin and mineral supplements as recommended by your health care  provider. What happens during an annual well check? The services and screenings done by your health care provider during your annual well check will depend on your age, overall health, lifestyle risk factors, and family history of disease. Counseling  Your health care provider may ask you questions about your: Alcohol use. Tobacco use. Drug use. Emotional well-being. Home and relationship well-being. Sexual activity. Eating habits. History of falls. Memory and ability to understand (cognition). Work and work Statistician. Reproductive health. Screening  You may have the following tests or measurements: Height, weight, and BMI. Blood pressure. Lipid and cholesterol levels. These may be checked every 5 years, or more frequently if you are over 22 years old. Skin check. Lung cancer screening. You may have this screening every year starting at age 81 if you have a 30-pack-year history of smoking and currently smoke or have quit within the past 15 years. Fecal occult blood test (FOBT) of the stool. You may have this test every year starting at age 69. Flexible sigmoidoscopy or colonoscopy. You may have a sigmoidoscopy every 5 years or a colonoscopy every 10 years starting at age 42. Hepatitis C blood test. Hepatitis B blood test. Sexually transmitted disease (STD) testing. Diabetes screening. This is done by checking your blood sugar (glucose) after you have not eaten for a while (fasting). You may have this done every 1-3 years. Bone density scan. This is done to screen for  osteoporosis. You may have this done starting at age 74. Mammogram. This may be done every 1-2 years. Talk to your health care provider about how often you should have regular mammograms. Talk with your health care provider about your test results, treatment options, and if necessary, the need for more tests. Vaccines  Your health care provider may recommend certain vaccines, such as: Influenza vaccine. This is  recommended every year. Tetanus, diphtheria, and acellular pertussis (Tdap, Td) vaccine. You may need a Td booster every 10 years. Zoster vaccine. You may need this after age 37. Pneumococcal 13-valent conjugate (PCV13) vaccine. One dose is recommended after age 69. Pneumococcal polysaccharide (PPSV23) vaccine. One dose is recommended after age 10. Talk to your health care provider about which screenings and vaccines you need and how often you need them. This information is not intended to replace advice given to you by your health care provider. Make sure you discuss any questions you have with your health care provider. Document Released: 09/25/2015 Document Revised: 05/18/2016 Document Reviewed: 06/30/2015 Elsevier Interactive Patient Education  2017 [employer] Prevention in the Home Falls can cause injuries. They can happen to people of all ages. There are many things you can do to make your home safe and to help prevent falls. What can I do on the outside of my home? Regularly fix the edges of walkways and driveways and fix any cracks. Remove anything that might make you trip as you walk through a door, such as a raised step or threshold. Trim any bushes or trees on the path to your home. Use bright outdoor lighting. Clear any walking paths of anything that might make someone trip, such as rocks or tools. Regularly check to see if handrails are loose or broken. Make sure that both sides of any steps have handrails. Any raised decks and porches should have guardrails on the edges. Have any leaves, snow, or ice cleared regularly. Use sand or salt on walking paths during winter. Clean up any spills in your garage right away. This includes oil or grease spills. What can I do in the bathroom? Use night lights. Install grab bars by the toilet and in the tub and shower. Do not use towel bars as grab bars. Use non-skid mats or decals in the tub or shower. If you need to sit down in  the shower, use a plastic, non-slip stool. Keep the floor dry. Clean up any water that spills on the floor as soon as it happens. Remove soap buildup in the tub or shower regularly. Attach bath mats securely with double-sided non-slip rug tape. Do not have throw rugs and other things on the floor that can make you trip. What can I do in the bedroom? Use night lights. Make sure that you have a light by your bed that is easy to reach. Do not use any sheets or blankets that are too big for your bed. They should not hang down onto the floor. Have a firm chair that has side arms. You can use this for support while you get dressed. Do not have throw rugs and other things on the floor that can make you trip. What can I do in the kitchen? Clean up any spills right away. Avoid walking on wet floors. Keep items that you use a lot in easy-to-reach places. If you need to reach something above you, use a strong step stool that has a grab bar. Keep electrical cords out of the way. Do not  use floor polish or wax that makes floors slippery. If you must use wax, use non-skid floor wax. Do not have throw rugs and other things on the floor that can make you trip. What can I do with my stairs? Do not leave any items on the stairs. Make sure that there are handrails on both sides of the stairs and use them. Fix handrails that are broken or loose. Make sure that handrails are as long as the stairways. Check any carpeting to make sure that it is firmly attached to the stairs. Fix any carpet that is loose or worn. Avoid having throw rugs at the top or bottom of the stairs. If you do have throw rugs, attach them to the floor with carpet tape. Make sure that you have a light switch at the top of the stairs and the bottom of the stairs. If you do not have them, ask someone to add them for you. What else can I do to help prevent falls? Wear shoes that: Do not have high heels. Have rubber bottoms. Are comfortable  and fit you well. Are closed at the toe. Do not wear sandals. If you use a stepladder: Make sure that it is fully opened. Do not climb a closed stepladder. Make sure that both sides of the stepladder are locked into place. Ask someone to hold it for you, if possible. Clearly mark and make sure that you can see: Any grab bars or handrails. First and last steps. Where the edge of each step is. Use tools that help you move around (mobility aids) if they are needed. These include: Canes. Walkers. Scooters. Crutches. Turn on the lights when you go into a dark area. Replace any light bulbs as soon as they burn out. Set up your furniture so you have a clear path. Avoid moving your furniture around. If any of your floors are uneven, fix them. If there are any pets around you, be aware of where they are. Review your medicines with your doctor. Some medicines can make you feel dizzy. This can increase your chance of falling. Ask your doctor what other things that you can do to help prevent falls. This information is not intended to replace advice given to you by your health care provider. Make sure you discuss any questions you have with your health care provider. Document Released: 06/25/2009 Document Revised: 02/04/2016 Document Reviewed: 10/03/2014 Elsevier Interactive Patient Education  2017 [employer].

## 2022-10-12 NOTE — Progress Notes (Signed)
Virtual Visit via Telephone Note  I connected with  [name] on 10/12/22 at 11:00 AM EST by telephone and verified that I am speaking with the correct person using two identifiers.  Location: Patient: home Provider: [place] participating in the virtual visit: patient/Nurse Health Advisor   I discussed the limitations, risks, security and privacy concerns of performing an evaluation and management service by telephone and the availability of in person appointments. The patient expressed understanding and agreed to proceed.  Interactive audio and video telecommunications were attempted between this nurse and patient, however failed, due to patient having technical difficulties OR patient did not have access to video capability.  We continued and completed visit with audio only.  Some vital signs may be absent or patient reported.   Amad Mau Moody Bruins, LPN  Subjective:   Suzy Kugel is a 67 y.o. female who presents for Medicare Annual (Subsequent) preventive examination.  Review of Systems     Cardiac Risk Factors include: hypertension;dyslipidemia     Objective:    Today's Vitals   10/12/22 1151  Weight: 193 lb (87.5 kg)  Height: '5\' 5"'$  (1.651 m)   Body mass index is 32.12 kg/m.     10/12/2022   11:14 AM 10/04/2021    2:45 PM 10/19/2020    1:28 PM 09/17/2020    7:35 AM  Advanced Directives  Does Patient Have a Medical Advance Directive? Yes Yes Yes Yes  Type of Paramedic of [place];Living will [place];Living will Living will;Healthcare Power of Attorney   Does patient want to make changes to medical advance directive?  No - Patient declined No - Patient declined No - Patient declined  Copy of [place] in Chart? No - copy requested No - copy requested No - copy requested     Current Medications (verified) Outpatient Encounter Medications as of 10/12/2022  Medication Sig    amLODipine-benazepril (LOTREL) 5-40 MG capsule TAKE 1 CAPSULE BY MOUTH  DAILY   calcium carbonate (OS-CAL) 600 MG tablet Take 600 mg by mouth daily.   Cholecalciferol (VITAMIN D) 50 MCG (2000 UT) tablet Take 2,000 Units by mouth daily.   diclofenac sodium (VOLTAREN) 1 % GEL Apply topically 4 (four) times daily.   estradiol (ESTRACE) 0.1 MG/GM vaginal cream Place 1 Applicatorful vaginally once a week.   fluticasone (FLONASE) 50 MCG/ACT nasal spray Place into both nostrils as needed for allergies or rhinitis.   hydroxychloroquine (PLAQUENIL) 200 MG tablet TAKE 1 TABLET BY MOUTH TWICE  DAILY   levothyroxine (SYNTHROID) 137 MCG tablet Take 1 tablet by mouth daily except take 1/2 tablet on Tuesdays & Thursdays.   pramoxine-hydrocortisone (PROCTOCREAM-HC) 1-1 % rectal cream Place 1 application rectally 2 (two) times daily as needed for hemorrhoids or anal itching.   pravastatin (PRAVACHOL) 40 MG tablet Take 1 tablet (40 mg total) by mouth daily.   SYMBICORT 160-4.5 MCG/ACT inhaler USE 2 INHALATIONS BY MOUTH TWICE DAILY   No facility-administered encounter medications on file as of 10/12/2022.    Allergies (verified) Lovenox [enoxaparin sodium]   History: Past Medical History:  Diagnosis Date   Allergy    Arthritis    Asthma    GERD (gastroesophageal reflux disease)    Hyperlipidemia    Hypertension    Thyroid cancer ([place])    thyroid   Thyroid disease    Postsurgical hypothyroidism   Past Surgical History:  Procedure Laterality Date   ABDOMINAL HYSTERECTOMY  1993  CHOLECYSTECTOMY  1982   EXTRACORPOREAL SHOCK WAVE LITHOTRIPSY Left 09/17/2020   Procedure: EXTRACORPOREAL SHOCK WAVE LITHOTRIPSY (ESWL);  Surgeon: Franchot Gallo, MD;  Location: [facility];  Service: Urology;  Laterality: Left;   LAPAROSCOPIC GASTRIC SLEEVE RESECTION  07/2018   left knee replacement  2008   ORIF FEMUR FRACTURE  04/13/2022   partial knee revision  04/13/2022   right ankle replacement   2012   right knee replacement  2009   trimallar [place]   right ankle   Family History  Problem Relation Age of Onset   COPD Mother    Heart disease Mother    Cancer Father        thyroid cancer   Juvenile Rhematoid Arthritis Sister    Stroke Sister    Thyroid cancer Brother    Thyroid cancer Daughter    Healthy Daughter    Healthy Son    Healthy Daughter    Colon cancer Neg Hx    Esophageal cancer Neg Hx    Pancreatic cancer Neg Hx    Stomach cancer Neg Hx    Liver disease Neg Hx    Social History   Socioeconomic History   Marital status: Married    Spouse name: Not on file   Number of children: Not on file   Years of education: Not on file   Highest education level: Not on file  Occupational History   Not on file  Tobacco Use   Smoking status: Never    Passive exposure: Past   Smokeless tobacco: Never  Vaping Use   Vaping Use: Never used  Substance and Sexual Activity   Alcohol use: Never    Comment: occ wine    Drug use: Never   Sexual activity: Yes    Birth control/protection: None  Other Topics Concern   Not on file  Social History Narrative   Not on file   Social Determinants of Health   Financial Resource Strain: Low Risk  (10/12/2022)   Overall Financial Resource Strain (CARDIA)    Difficulty of Paying Living Expenses: Not hard at all  Food Insecurity: No Food Insecurity (10/12/2022)   Hunger Vital Sign    Worried About Running Out of Food in the Last Year: Never true    Ran Out of Food in the Last Year: Never true  Transportation Needs: No Transportation Needs (10/12/2022)   PRAPARE - Hydrologist (Medical): No    Lack of Transportation (Non-Medical): No  Physical Activity: Sufficiently Active (10/12/2022)   Exercise Vital Sign    Days of Exercise per Week: 3 days    Minutes of Exercise per Session: 60 min  Stress: No Stress Concern Present (10/12/2022)   [place]    Feeling of Stress : Not at all  Social Connections: Moderately Isolated (10/12/2022)   Social Connection and Isolation Panel [NHANES]    Frequency of Communication with Friends and Family: More than three times a week    Frequency of Social Gatherings with Friends and Family: Three times a week    Attends Religious Services: Never    Active Member of Clubs or Organizations: No    Attends Archivist Meetings: Never    Marital Status: Married    Tobacco Counseling Counseling given: Not Answered   Clinical Intake:  Pre-visit preparation completed: Yes  Pain : No/denies pain     Diabetes: No  How  often do you need to have someone help you when you read instructions, pamphlets, or other written materials from your doctor or pharmacy?: 1 - Never  Diabetic?no  Interpreter Needed?: No  Information entered by :: C. Johnni Wunschel LPN   Activities of Daily Living    10/12/2022   11:42 AM  In your present state of health, do you have any difficulty performing the following activities:  Hearing? 0  Vision? 0  Difficulty concentrating or making decisions? 0  Walking or climbing stairs? 0  Dressing or bathing? 1  Doing errands, shopping? 0  Preparing Food and eating ? N  Using the Toilet? N  In the past six months, have you accidently leaked urine? N  Do you have problems with loss of bowel control? N  Managing your Medications? N  Managing your Finances? N  Housekeeping or managing your Housekeeping? N    Patient Care Team: [place], Betty G, MD as PCP - General (Family Medicine) Viona Gilmore, [facility] (Inactive) as Pharmacist (Pharmacist)  Indicate any recent Medical Services you may have received from other than [facility] providers in the past year (date may be approximate).     Assessment:   This is a routine wellness examination for [employer].  Hearing/Vision screen Hearing Screening - Comments:: No hearing aids Vision Screening - Comments::  Wears glasses Dr.Grout has appointment in April 2024  Dietary issues and exercise activities discussed: Current Exercise Habits: Structured exercise class, Type of exercise: Other - see comments (Water aerobics), Time (Minutes): 60, Frequency (Times/Week): 3, Weekly Exercise (Minutes/Week): 180, Intensity: Mild, Exercise limited by: None identified   Goals Addressed               This Visit's Progress     Patient Stated (pt-stated)        10/12/22 I would like to lose 20 pounds.       Depression Screen    10/12/2022   11:09 AM 10/03/2022    2:05 PM 04/22/2022    2:03 PM 12/31/2021   11:26 AM 10/04/2021    2:39 PM 10/19/2020    1:30 PM 08/23/2020    3:54 PM  PHQ 2/9 Scores  PHQ - 2 Score 0 0 0 0 0 0 0  PHQ- 9 Score 0 0 0 0   0    Fall Risk    10/12/2022   11:16 AM 10/03/2022    2:04 PM 10/04/2021    2:42 PM 10/19/2020    1:29 PM 02/17/2020    8:05 PM  Fall Risk   Falls in the past year? 1 1 0 0 1  Comment 04/2022      Number falls in past yr: 1 1 0 0 0  Injury with Fall? 1 1 0 0 1  Risk for fall due to : History of fall(s);Impaired balance/gait;Orthopedic patient History of fall(s)  No Fall Risks History of fall(s)  Follow up Education provided;Falls prevention discussed Falls evaluation completed  Falls evaluation completed;Falls prevention discussed Education provided    FALL RISK PREVENTION PERTAINING TO THE HOME:  Any stairs in or around the home? Yes  If so, are there any without handrails? Yes  Home free of loose throw rugs in walkways, pet beds, electrical cords, etc? Yes  Adequate lighting in your home to reduce risk of falls? Yes   ASSISTIVE DEVICES UTILIZED TO PREVENT FALLS:  Life alert? No  Use of a cane, walker or w/c? No  Grab bars in the bathroom? No  Shower  chair or bench in shower? Yes  Elevated toilet seat or a handicapped toilet? Yes    Cognitive Function:        10/12/2022   11:24 AM 10/04/2021    2:43 PM  6CIT Screen  What Year? 0 points 0  points  What month? 0 points 0 points  What time? 0 points 0 points  Count back from 20 0 points 0 points  Months in reverse 2 points 0 points  Repeat phrase 0 points 0 points  Total Score 2 points 0 points    Immunizations Immunization History  Administered Date(s) Administered   COVID-19, mRNA, vaccine(Comirnaty)12 years and older 06/27/2022   Fluad Quad(high Dose 65+) 06/29/2021   Influenza-Unspecified 05/22/2019, 06/10/2020   PFIZER Comirnaty(Gray Top)Covid-19 Tri-Sucrose Vaccine 05/08/2021   PFIZER(Purple Top)SARS-COV-2 Vaccination 11/17/2019, 12/08/2019, 06/10/2020   Pfizer Covid-19 Vaccine Bivalent Booster 42yr & up 08/19/2021   Pneumococcal Conjugate-13 06/07/2019   Pneumococcal Polysaccharide-23 08/18/2020   Tdap 06/07/2019   Zoster Recombinat (Shingrix) 12/23/2020, 02/27/2021    TDAP status: Up to date  Flu Vaccine status: Up to date  Pneumococcal vaccine status: Up to date  Covid-19 vaccine status: Completed vaccines  Qualifies for Shingles Vaccine? Yes   Zostavax completed No   Shingrix Completed?: Yes  Screening Tests Health Maintenance  Topic Date Due   COVID-19 Vaccine (7 - 2023-24 season) 08/22/2022   MAMMOGRAM  10/06/2023   [place]  10/13/2023   COLONOSCOPY (Pts 45-464yrInsurance coverage will need to be confirmed)  12/18/2023   DTaP/Tdap/Td (2 - Td or Tdap) 06/06/2029   Pneumonia Vaccine 6572Years old  Completed   INFLUENZA VACCINE  Completed   DEXA SCAN  Completed   Hepatitis C Screening  Completed   Zoster Vaccines- Shingrix  Completed   HPV VACCINES  Aged Out    Health Maintenance  Health Maintenance Due  Topic Date Due   COVID-19 Vaccine (7 - 2023-24 season) 08/22/2022    Colorectal cancer screening: Type of screening: Colonoscopy. Completed 12/17/2020. Repeat every 3 years  Mammogram status: Completed 10/05/2022. Repeat every year  Bone Density status: Completed 10/05/2022. Results reflect: Bone density  results: OSTEOPENIA. Repeat every 5 years.   Additional Screening:  Hepatitis C Screening: does qualify; Completed 02/17/2020  Vision Screening: Recommended annual ophthalmology exams for early detection of glaucoma and other disorders of the eye. Is the patient up to date with their annual eye exam?  Yes  Who is the provider or what is the name of the office in which the patient attends annual eye exams? Dr.Grout If pt is not established with a provider, would they like to be referred to a provider to establish care? No .   Dental Screening: Recommended annual dental exams for proper oral hygiene  Community Resource Referral / Chronic Care Management: CRR required this visit?  No   CCM required this visit?  No      Plan:     I have personally reviewed and noted the following in the patient's chart:   Medical and social history Use of alcohol, tobacco or illicit drugs  Current medications and supplements including opioid prescriptions. Patient is not currently taking opioid prescriptions. Functional ability and status Nutritional status Physical activity Advanced directives List of other physicians Hospitalizations, surgeries, and ER visits in previous 12 months Vitals Screenings to include cognitive, depression, and falls Referrals and appointments  In addition, I have reviewed and discussed with patient certain preventive protocols, quality metrics, and best practice recommendations.  A written personalized care plan for preventive services as well as general preventive health recommendations were provided to patient.     Lebron Conners, LPN   [id number]   Nurse Notes: none

## 2022-11-24 ENCOUNTER — Other Ambulatory Visit: Payer: Self-pay | Admitting: Rheumatology

## 2022-11-25 NOTE — Telephone Encounter (Signed)
Next Visit: 01/31/2023  Last Visit: 08/23/2022  Labs: 08/23/2022 CBC and CMP are normal except sodium is mildly decreased but improved.   Eye exam: 01/19/2022  WNL    Current Dose per office note 08/23/2022: Plaquenil 200 mg 1 tablet p.o. twice daily.   DX: Rheumatoid arthritis of multiple sites with negative rheumatoid factor   Last Fill: 03/21/2022  Okay to refill Plaquenil?

## 2022-12-26 ENCOUNTER — Telehealth: Payer: Self-pay | Admitting: Gastroenterology

## 2022-12-26 MED ORDER — PRAMOXINE-HC 1-1 % EX CREA: TOPICAL_CREAM | Freq: Two times a day (BID) | CUTANEOUS | 1 refills | Status: DC | PRN

## 2022-12-26 NOTE — Telephone Encounter (Signed)
Patient called stating she is out of refills for pramoxine hydrocortisone cream. She would like to speak about another refill without having a office visit. States her bleeding is not as much but it is nice to have when it does happen. Please advise

## 2022-12-26 NOTE — Telephone Encounter (Signed)
Ok to send Rx for Analpram rectal cream BID prn for 7 days and schedule follow up visit. Thanks

## 2022-12-26 NOTE — Telephone Encounter (Signed)
Spoke with patient gave her appointment information

## 2022-12-26 NOTE — Telephone Encounter (Signed)
Dr Lavon Paganini please advise, Patient has not been seen since 2022

## 2022-12-27 DIAGNOSIS — H5213 Myopia, bilateral: Secondary | ICD-10-CM | POA: Diagnosis not present

## 2022-12-27 DIAGNOSIS — H52203 Unspecified astigmatism, bilateral: Secondary | ICD-10-CM | POA: Diagnosis not present

## 2022-12-27 DIAGNOSIS — Z79899 Other long term (current) drug therapy: Secondary | ICD-10-CM | POA: Diagnosis not present

## 2022-12-30 NOTE — Progress Notes (Signed)
HPI: Mercedes Hodge is a 67 y.o. female with PMHx significant for hypothyroidism, OA, asthma,GERD, hypertension, aortic atherosclerosis, dyslipidemia  here today for her routine physical.  Last CPE: 12/31/21  She exercises regularly, participating in water aerobics three times a week and maintains a healthy diet with daily vegetable intake.  She sleeps eight to nine hours on average, does not smoke, and consumes alcohol moderately with a glass of wine three to four times a week.  Immunization History  Administered Date(s) Administered   COVID-19, mRNA, vaccine(Comirnaty)12 years and older 06/27/2022   Fluad Quad(high Dose 65+) 06/29/2021   Influenza-Unspecified 05/22/2019, 06/10/2020, 06/27/2022   PFIZER Comirnaty(Gray Top)Covid-19 Tri-Sucrose Vaccine 05/08/2021   PFIZER(Purple Top)SARS-COV-2 Vaccination 11/17/2019, 12/08/2019, 06/10/2020   Pfizer Covid-19 Vaccine Bivalent Booster 90yrs & up 08/19/2021   Pneumococcal Conjugate-13 06/07/2019   Pneumococcal Polysaccharide-23 08/18/2020   Respiratory Syncytial Virus Vaccine,Recomb Aduvanted(Arexvy) 06/18/2022   Tdap 06/07/2019   Zoster Recombinat (Shingrix) 12/23/2020, 02/27/2021   Health Maintenance  Topic Date Due   COVID-19 Vaccine (7 - 2023-24 season) 01/18/2023 (Originally 08/22/2022)   INFLUENZA VACCINE  04/13/2023   MAMMOGRAM  10/06/2023   Medicare Annual Wellness (AWV)  10/13/2023   COLONOSCOPY (Pts 45-10yrs Insurance coverage will need to be confirmed)  12/18/2023   DTaP/Tdap/Td (2 - Td or Tdap) 06/06/2029   Pneumonia Vaccine 43+ Years old  Completed   DEXA SCAN  Completed   Hepatitis C Screening  Completed   Zoster Vaccines- Shingrix  Completed   HPV VACCINES  Aged Out   She reports having right foot pain. She has a history of ankle and foot surgery seven years ago. The pain is located at the top of the foot and has been increasing, but there is no swelling or erythema. She has an upcoming appointment with Dr.  Victorino Dike, an ankle specialist at [facility].  RA: She sees her rheumatologist twice a year and has blood work during visits.  Hyperlipidemia: Currently she is on pravastatin 40 mg daily.  She also follows low-fat diet. Lab Results  Component Value Date   CHOL 196 02/24/2022   HDL 86.80 02/24/2022   LDLCALC 95 02/24/2022   TRIG 72.0 02/24/2022   CHOLHDL 2 02/24/2022   Hypertension: Currently she is on amlodipine-benazepril 5-40 mg daily. She reports that her blood pressure is well-controlled. Eye exam a few weeks ago. Lab Results  Component Value Date   CREATININE 0.93 08/23/2022   BUN 23 08/23/2022   NA 133 (L) 08/23/2022   K 3.8 08/23/2022   CL 99 08/23/2022   CO2 25 08/23/2022   Depression on remission, she no longer on Lexapro.    01/02/2023   10:32 AM 10/12/2022   11:09 AM 10/03/2022    2:05 PM 04/22/2022    2:03 PM 12/31/2021   11:26 AM  Depression screen PHQ 2/9  Decreased Interest 0 0 0 0 0  Down, Depressed, Hopeless 0 0 0 0 0  PHQ - 2 Score 0 0 0 0 0  Altered sleeping 0 0 0 0 0  Tired, decreased energy 0 0 0 0 0  Change in appetite 0 0 0 0 0  Feeling bad or failure about yourself  0 0 0 0 0  Trouble concentrating 0 0 0 0 0  Moving slowly or fidgety/restless 0 0 0 0 0  Suicidal thoughts 0 0 0 0 0  PHQ-9 Score 0 0 0 0 0  Difficult doing work/chores Not difficult at all Not difficult at all Not  difficult at all  Not difficult at all   Postablative hypothyroidism on Synthroid 137 mcg daily x 5-1/2 tablet Tuesdays and Thursdays. Lab Results  Component Value Date   TSH 2.23 02/24/2022   Recovering UTIs, currently she is on estradiol vaginal cream 2-3 times per week.  Asthma: She is on Symbicort 160-4.5 mcg twice daily as needed. She has not used albuterol inhaler in a while.  Review of Systems  Constitutional:  Negative for appetite change and fever.  HENT:  Negative for hearing loss, mouth sores, sore throat and trouble swallowing.   Eyes:  Negative for  redness and visual disturbance.  Respiratory:  Negative for cough, shortness of breath and wheezing.   Cardiovascular:  Negative for chest pain and leg swelling.  Gastrointestinal:  Negative for abdominal pain, nausea and vomiting.       No changes in bowel habits.  Endocrine: Negative for cold intolerance, heat intolerance, polydipsia, polyphagia and polyuria.  Genitourinary:  Negative for decreased urine volume, dysuria, hematuria, vaginal bleeding and vaginal discharge.  Musculoskeletal:  Positive for arthralgias. Negative for joint swelling.  Skin:  Negative for color change and rash.  Allergic/Immunologic: Positive for environmental allergies.  Neurological:  Negative for syncope, weakness and headaches.  Hematological:  Negative for adenopathy. Does not bruise/bleed easily.  Psychiatric/Behavioral:  Negative for confusion and sleep disturbance. The patient is not nervous/anxious.   All other systems reviewed and are negative.  Current Outpatient Medications on File Prior to Visit  Medication Sig Dispense Refill   amLODipine-benazepril (LOTREL) 5-40 MG capsule TAKE 1 CAPSULE BY MOUTH DAILY 100 capsule 2   calcium carbonate (OS-CAL) 600 MG tablet Take 600 mg by mouth daily.     Cholecalciferol (VITAMIN D) 50 MCG (2000 UT) tablet Take 2,000 Units by mouth daily.     diclofenac sodium (VOLTAREN) 1 % GEL Apply topically 4 (four) times daily.     estradiol (ESTRACE) 0.1 MG/GM vaginal cream Place 1 Applicatorful vaginally once a week. 42.5 g 3   fluticasone (FLONASE) 50 MCG/ACT nasal spray Place into both nostrils as needed for allergies or rhinitis.     hydroxychloroquine (PLAQUENIL) 200 MG tablet TAKE 1 TABLET BY MOUTH TWICE  DAILY 180 tablet 0   levothyroxine (SYNTHROID) 137 MCG tablet Take 1 tablet by mouth daily except take 1/2 tablet on Tuesdays & Thursdays. 90 tablet 2   pramoxine-hydrocortisone (ANALPRAM HC) cream Apply topically 2 (two) times daily as needed. For 7 days 30 g 1    pramoxine-hydrocortisone (PROCTOCREAM-HC) 1-1 % rectal cream Place 1 application rectally 2 (two) times daily as needed for hemorrhoids or anal itching. 30 g 1   pravastatin (PRAVACHOL) 40 MG tablet Take 1 tablet (40 mg total) by mouth daily. 90 tablet 2   SYMBICORT 160-4.5 MCG/ACT inhaler USE 2 INHALATIONS BY MOUTH TWICE DAILY 30.6 g 3   No current facility-administered medications on file prior to visit.   Past Medical History:  Diagnosis Date   Allergy    Arthritis    Asthma    GERD (gastroesophageal reflux disease)    Hyperlipidemia    Hypertension    Thyroid cancer    thyroid   Thyroid disease    Postsurgical hypothyroidism    Past Surgical History:  Procedure Laterality Date   ABDOMINAL HYSTERECTOMY  1993   CHOLECYSTECTOMY  1982   EXTRACORPOREAL SHOCK WAVE LITHOTRIPSY Left 09/17/2020   Procedure: EXTRACORPOREAL SHOCK WAVE LITHOTRIPSY (ESWL);  Surgeon: Marcine Matar, MD;  Location: [facility];  Service:  Urology;  Laterality: Left;   LAPAROSCOPIC GASTRIC SLEEVE RESECTION  07/2018   left knee replacement  2008   ORIF FEMUR FRACTURE  04/13/2022   partial knee revision  04/13/2022   right ankle replacement  2012   right knee replacement  2009   trimallar FX  1995   right ankle   Allergies  Allergen Reactions   Lovenox [Enoxaparin Sodium] Rash    rash    Family History  Problem Relation Age of Onset   COPD Mother    Heart disease Mother    Cancer Father        thyroid cancer   Juvenile Rhematoid Arthritis Sister    Stroke Sister    Thyroid cancer Brother    Thyroid cancer Daughter    Healthy Daughter    Healthy Son    Healthy Daughter    Colon cancer Neg Hx    Esophageal cancer Neg Hx    Pancreatic cancer Neg Hx    Stomach cancer Neg Hx    Liver disease Neg Hx     Social History   Socioeconomic History   Marital status: Married    Spouse name: Not on file   Number of children: Not on file   Years of education: Not on file    Highest education level: Not on file  Occupational History   Not on file  Tobacco Use   Smoking status: Never    Passive exposure: Past   Smokeless tobacco: Never  Vaping Use   Vaping Use: Never used  Substance and Sexual Activity   Alcohol use: Never    Comment: occ wine    Drug use: Never   Sexual activity: Yes    Birth control/protection: None  Other Topics Concern   Not on file  Social History Narrative   Not on file   Social Determinants of Health   Financial Resource Strain: Low Risk  (10/12/2022)   Overall Financial Resource Strain (CARDIA)    Difficulty of Paying Living Expenses: Not hard at all  Food Insecurity: No Food Insecurity (10/12/2022)   Hunger Vital Sign    Worried About Running Out of Food in the Last Year: Never true    Ran Out of Food in the Last Year: Never true  Transportation Needs: No Transportation Needs (10/12/2022)   PRAPARE - Administrator, Civil Service (Medical): No    Lack of Transportation (Non-Medical): No  Physical Activity: Sufficiently Active (10/12/2022)   Exercise Vital Sign    Days of Exercise per Week: 3 days    Minutes of Exercise per Session: 60 min  Stress: No Stress Concern Present (10/12/2022)   [employer] Occupational [employer] - Occupational Stress Questionnaire    Feeling of Stress : Not at all  Social Connections: Moderately Isolated (10/12/2022)   Social Connection and Isolation Panel [NHANES]    Frequency of Communication with Friends and Family: More than three times a week    Frequency of Social Gatherings with Friends and Family: Three times a week    Attends Religious Services: Never    Active Member of Clubs or Organizations: No    Attends Banker Meetings: Never    Marital Status: Married    Vitals:   01/02/23 1021  BP: 124/70  Pulse: 70  Resp: 16  Temp: 98.4 F (36.9 C)  SpO2: 94%  Body mass index is 31.21 kg/m. Wt Readings from Last 3 Encounters:  01/02/23 187 lb 9 oz  (  85.1 kg)  10/12/22 193 lb (87.5 kg)  10/03/22 193 lb (87.5 kg)   Physical Exam Vitals and nursing note reviewed.  Constitutional:      General: She is not in acute distress.    Appearance: She is well-developed.  HENT:     Head: Normocephalic and atraumatic.     Right Ear: Hearing, tympanic membrane, ear canal and external ear normal.     Left Ear: Hearing, tympanic membrane, ear canal and external ear normal.     Mouth/Throat:     Mouth: Mucous membranes are moist.     Pharynx: Oropharynx is clear. Uvula midline.  Eyes:     Conjunctiva/sclera: Conjunctivae normal.     Pupils: Pupils are equal, round, and reactive to light.  Neck:     Thyroid: No thyroid mass.  Cardiovascular:     Rate and Rhythm: Normal rate and regular rhythm.     Pulses:          Dorsalis pedis pulses are 2+ on the right side and 2+ on the left side.     Heart sounds: No murmur heard. Pulmonary:     Effort: Pulmonary effort is normal. No respiratory distress.     Breath sounds: Normal breath sounds.  Abdominal:     Palpations: Abdomen is soft. There is no hepatomegaly or mass.     Tenderness: There is no abdominal tenderness.  Genitourinary:    Comments: No concerns. Musculoskeletal:     Comments: No signs of synovitis appreciated.  Lymphadenopathy:     Cervical: No cervical adenopathy.  Skin:    General: Skin is warm.     Findings: No erythema or rash.  Neurological:     General: No focal deficit present.     Mental Status: She is alert and oriented to person, place, and time.     Cranial Nerves: No cranial nerve deficit.     Coordination: Coordination normal.     Gait: Gait normal.     Deep Tendon Reflexes:     Reflex Scores:      Bicep reflexes are 2+ on the right side and 2+ on the left side.      Patellar reflexes are 2+ on the right side and 2+ on the left side. Psychiatric:        Mood and Affect: Mood and affect normal.   ASSESSMENT AND PLAN: Ms. Haylen Viau was here today  annual physical examination.  Routine general medical examination at a health care facility Assessment & Plan: We discussed the importance of regular physical activity and healthy diet for prevention of chronic illness and/or complications. Preventive guidelines reviewed. Vaccination up-to-date. Ca++ and vit D supplementation to continue. Next CPE in a year.   Dyslipidemia Assessment & Plan: Continue pravastatin 40 mg daily. She is planning on having blood work done at her rheumatologist office, lipid panel to be added.   Essential hypertension Assessment & Plan: Problem is well-controlled. Continue amlodipine- benazepril 5-40 mg daily and low salt diet. Continue monitoring BP at home regularly. Eye exam is current. She follows with rheumatologist twice per year, so I think it is appropriate to follow-up in a year, before if needed.   Aortic atherosclerosis Assessment & Plan: Last LDL 95 in 02/2022. She is not fasting today. Fasting lipid panel to be added to next blood work at her rheumatologist office. Continue pravastatin 40 mg daily.   Postoperative hypothyroidism Assessment & Plan: Last TSH 2.2 in 02/2022. Continue levothyroxine 137 mcg 1/2  tablet Tuesdays and Thursdays and 1 tablet the rest of the days. TSH to be added to next blood work at her rheumatologist's office.   Asthma, intermittent, uncomplicated Assessment & Plan: Problem is well controlled, she does not have concerns about this today. Continue Symbicort 160-4.5 mcg twice daily as needed.   Return in about 1 year (around 01/02/2024) for CPE.  Zaccai Chavarin G. [place], MD  [facility]. [place] office.

## 2022-12-31 ENCOUNTER — Other Ambulatory Visit: Payer: Self-pay | Admitting: Family Medicine

## 2022-12-31 DIAGNOSIS — I10 Essential (primary) hypertension: Secondary | ICD-10-CM

## 2023-01-02 ENCOUNTER — Encounter: Payer: Self-pay | Admitting: Family Medicine

## 2023-01-02 ENCOUNTER — Ambulatory Visit (INDEPENDENT_AMBULATORY_CARE_PROVIDER_SITE_OTHER): Payer: Medicare Other | Admitting: Family Medicine

## 2023-01-02 VITALS — BP 124/70 | HR 70 | Temp 98.4°F | Resp 16 | Ht 65.0 in | Wt 187.6 lb

## 2023-01-02 DIAGNOSIS — E785 Hyperlipidemia, unspecified: Secondary | ICD-10-CM | POA: Diagnosis not present

## 2023-01-02 DIAGNOSIS — E890 Postprocedural hypothyroidism: Secondary | ICD-10-CM

## 2023-01-02 DIAGNOSIS — Z0000 Encounter for general adult medical examination without abnormal findings: Secondary | ICD-10-CM

## 2023-01-02 DIAGNOSIS — I700 Atherosclerosis of aorta: Secondary | ICD-10-CM | POA: Diagnosis not present

## 2023-01-02 DIAGNOSIS — J4520 Mild intermittent asthma, uncomplicated: Secondary | ICD-10-CM | POA: Diagnosis not present

## 2023-01-02 DIAGNOSIS — I10 Essential (primary) hypertension: Secondary | ICD-10-CM | POA: Diagnosis not present

## 2023-01-02 MED ORDER — LEVOTHYROXINE SODIUM 137 MCG PO TABS: ORAL_TABLET | ORAL | 2 refills | Status: DC

## 2023-01-02 NOTE — Assessment & Plan Note (Signed)
Last TSH 2.2 in 02/2022. Continue levothyroxine 137 mcg 1/2 tablet Tuesdays and Thursdays and 1 tablet the rest of the days. TSH to be added to next blood work at her rheumatologist's office.

## 2023-01-02 NOTE — Assessment & Plan Note (Signed)
Problem is well controlled, she does not have concerns about this today. ?Continue Symbicort 160-4.5 mcg twice daily as needed. ?

## 2023-01-02 NOTE — Patient Instructions (Addendum)
A few things to remember from today's visit:  Routine general medical examination at a health care facility  Dyslipidemia  Essential hypertension  Depression, major, in remission, Chronic  Aortic atherosclerosis, Chronic Thyroid and cholesterol can be added to next blood work. No changes today.  If you need refills for medications you take chronically, please call your pharmacy. Do not use My Chart to request refills or for acute issues that need immediate attention. If you send a my chart message, it may take a few days to be addressed, specially if I am not in the office.  Please be sure medication list is accurate. If a new problem present, please set up appointment sooner than planned today.

## 2023-01-02 NOTE — Assessment & Plan Note (Signed)
We discussed the importance of regular physical activity and healthy diet for prevention of chronic illness and/or complications. Preventive guidelines reviewed. Vaccination up to date. Ca++ and vit D supplementation to continue. Next CPE in a year. 

## 2023-01-02 NOTE — Assessment & Plan Note (Signed)
Problem is well-controlled. Continue amlodipine- benazepril 5-40 mg daily and low salt diet. Continue monitoring BP at home regularly. Eye exam is current. She follows with rheumatologist twice per year, so I think it is appropriate to follow-up in a year, before if needed.

## 2023-01-02 NOTE — Assessment & Plan Note (Signed)
Last LDL 95 in 02/2022. She is not fasting today. Fasting lipid panel to be added to next blood work at her rheumatologist office. Continue pravastatin 40 mg daily.

## 2023-01-02 NOTE — Assessment & Plan Note (Signed)
Continue pravastatin 40 mg daily. She is planning on having blood work done at her rheumatologist office, lipid panel to be added.

## 2023-01-17 NOTE — Progress Notes (Signed)
Office Visit Note  Patient: Samon Ptacek             Date of Birth: [birth year]           MRN: [id number]             PCP: [place], Betty G, MD Referring: [place], Betty G, MD Visit Date: 01/31/2023 Occupation: @GUAROCC @  Subjective:  Medication management  History of Present Illness: Frann Slutz is a 67 y.o. female with seropositive rheumatoid arthritis, osteoarthritis and osteopenia.  She returns today after her last visit in December 2023.  She had left knee joint revision and left femur surgery after a fall last year.  She is on hydroxychloroquine 200 mg p.o. twice daily which she has been taking without any interruption.  She denies any side effects from the medication.  She had Plaquenil eye examination on December 27, 2022 at [facility].  She has been participating in the water aerobics classes on a regular basis.  She also goes for a walk on a regular basis.  She is planning a trip to [place] this summer.    Activities of Daily Living:  Patient reports morning stiffness for a few minutes.   Patient Denies nocturnal pain.  Difficulty dressing/grooming: Denies Difficulty climbing stairs: Denies Difficulty getting out of chair: Denies Difficulty using hands for taps, buttons, cutlery, and/or writing: Denies  Review of Systems  Constitutional:  Negative for fatigue.  HENT:  Negative for mouth sores and mouth dryness.   Eyes:  Negative for dryness.  Respiratory:  Negative for shortness of breath.   Cardiovascular:  Negative for chest pain and palpitations.  Gastrointestinal:  Negative for blood in stool, constipation and diarrhea.  Endocrine: Negative for increased urination.  Genitourinary:  Negative for involuntary urination.  Musculoskeletal:  Positive for morning stiffness. Negative for joint pain, gait problem, joint pain, joint swelling, myalgias, muscle weakness, muscle tenderness and myalgias.  Skin:  Negative for color change, rash, hair loss and  sensitivity to sunlight.  Allergic/Immunologic: Negative for susceptible to infections.  Neurological:  Negative for dizziness and headaches.  Hematological:  Negative for swollen glands.  Psychiatric/Behavioral:  Negative for depressed mood and sleep disturbance. The patient is not nervous/anxious.     PMFS History:  Patient Active Problem List   Diagnosis Date Noted   Routine general medical examination at a health care facility 01/02/2023   Ganglion cyst of flexor tendon sheath 03/04/2022   Depression, major, in remission (HCC) 06/29/2021   Aortic atherosclerosis (HCC) 08/18/2020   History of colonic polyps 07/20/2020   Atrophic vaginitis 02/17/2020   Rheumatoid arthritis of multiple sites with negative rheumatoid factor (HCC) 07/02/2019   High risk medication use 07/02/2019   Primary osteoarthritis of both hands 07/02/2019   Status post total bilateral knee replacement 07/02/2019   Status post right ankle joint replacement 07/02/2019   Essential hypertension 07/02/2019   Asthma, intermittent, uncomplicated 07/02/2019   History of thyroid cancer 07/02/2019   Postoperative hypothyroidism 07/02/2019   Dyslipidemia 07/02/2019   Gastroesophageal reflux disease without esophagitis 07/02/2019   Osteopenia of multiple sites 07/02/2019    Past Medical History:  Diagnosis Date   Allergy    Arthritis    Asthma    GERD (gastroesophageal reflux disease)    Hyperlipidemia    Hypertension    Thyroid cancer (HCC)    thyroid   Thyroid disease    Postsurgical hypothyroidism    Family History  Problem Relation Age of Onset  COPD Mother    Heart disease Mother    Cancer Father        thyroid cancer   Juvenile Rhematoid Arthritis Sister    Stroke Sister    Thyroid cancer Brother    Thyroid cancer Daughter    Healthy Daughter    Healthy Son    Healthy Daughter    Colon cancer Neg Hx    Esophageal cancer Neg Hx    Pancreatic cancer Neg Hx    Stomach cancer Neg Hx    Liver  disease Neg Hx    Past Surgical History:  Procedure Laterality Date   ABDOMINAL HYSTERECTOMY  1993   CHOLECYSTECTOMY  1982   EXTRACORPOREAL SHOCK WAVE LITHOTRIPSY Left 09/17/2020   Procedure: EXTRACORPOREAL SHOCK WAVE LITHOTRIPSY (ESWL);  Surgeon: Marcine Matar, MD;  Location: [facility];  Service: Urology;  Laterality: Left;   LAPAROSCOPIC GASTRIC SLEEVE RESECTION  07/2018   left knee replacement  2008   ORIF FEMUR FRACTURE  04/13/2022   partial knee revision  04/13/2022   right ankle replacement  2012   right knee replacement  2009   trimallar FX  1995   right ankle   Social History   Social History Narrative   Not on file   Immunization History  Administered Date(s) Administered   COVID-19, mRNA, vaccine(Comirnaty)12 years and older 06/27/2022   Fluad Quad(high Dose 65+) 06/29/2021   Influenza-Unspecified 05/22/2019, 06/10/2020, 06/27/2022   PFIZER Comirnaty(Gray Top)Covid-19 Tri-Sucrose Vaccine 05/08/2021   PFIZER(Purple Top)SARS-COV-2 Vaccination 11/17/2019, 12/08/2019, 06/10/2020   Pfizer Covid-19 Vaccine Bivalent Booster 36yrs & up 08/19/2021   Pneumococcal Conjugate-13 06/07/2019   Pneumococcal Polysaccharide-23 08/18/2020   Respiratory Syncytial Virus Vaccine,Recomb Aduvanted(Arexvy) 06/18/2022   Tdap 06/07/2019   Zoster Recombinat (Shingrix) 12/23/2020, 02/27/2021     Objective: Vital Signs: BP 114/72 (BP Location: Left Arm, Patient Position: Sitting, Cuff Size: Normal)   Pulse 74   Resp 15   Ht 5' 6.5" (1.689 m)   Wt 193 lb (87.5 kg)   BMI 30.68 kg/m    Physical Exam Vitals and nursing note reviewed.  Constitutional:      Appearance: She is well-developed.  HENT:     Head: Normocephalic and atraumatic.  Eyes:     Conjunctiva/sclera: Conjunctivae normal.  Cardiovascular:     Rate and Rhythm: Normal rate and regular rhythm.     Heart sounds: Normal heart sounds.  Pulmonary:     Effort: Pulmonary effort is normal.     Breath  sounds: Normal breath sounds.  Abdominal:     General: Bowel sounds are normal.     Palpations: Abdomen is soft.  Musculoskeletal:     Cervical back: Normal range of motion.  Lymphadenopathy:     Cervical: No cervical adenopathy.  Skin:    General: Skin is warm and dry.     Capillary Refill: Capillary refill takes less than 2 seconds.  Neurological:     Mental Status: She is alert and oriented to person, place, and time.  Psychiatric:        Behavior: Behavior normal.      Musculoskeletal Exam: Cervical spine was in good range of motion.  Shoulder joints, elbow joints, wrist joints, MCPs PIPs and DIPs were in good range of motion.  She had bilateral CMC PIP and DIP thickening with no synovitis.  Hip joints and knee joints in good range of motion.  She had bilateral replaced knee joints.  She also had right ankle joint replaced which had  limited range of motion and thickening.  Dorsal spurs were noted.  No synovitis was noted.  CDAI Exam: CDAI Score: -- Patient Global: 1 mm; Provider Global: 1 mm Swollen: --; Tender: -- Joint Exam 01/31/2023   No joint exam has been documented for this visit   There is currently no information documented on the homunculus. Go to the Rheumatology activity and complete the homunculus joint exam.  Investigation: No additional findings.  Imaging: No results found.  Recent Labs: Lab Results  Component Value Date   WBC 7.6 08/23/2022   HGB 13.1 08/23/2022   PLT 218 08/23/2022   NA 133 (L) 08/23/2022   K 3.8 08/23/2022   CL 99 08/23/2022   CO2 25 08/23/2022   GLUCOSE 98 08/23/2022   BUN 23 08/23/2022   CREATININE 0.93 08/23/2022   BILITOT 0.6 08/23/2022   AST 20 08/23/2022   ALT 14 08/23/2022   PROT 6.8 08/23/2022   CALCIUM 9.6 08/23/2022   GFRAA 93 11/27/2020    Speciality Comments: Plaquenil eye exam: 12/27/2022  [facility]  Follow up in 1 year DEXA at [facility] through PCP  Procedures:  No procedures  performed Allergies: Lovenox [enoxaparin sodium]   Assessment / Plan:     Visit Diagnoses: Rheumatoid arthritis of multiple sites with negative rheumatoid factor (HCC) + anti-CCP - Diagnosed in [place].  She has been doing well on hydroxychloroquine 200 mg p.o. twice daily.  She has been tolerating medication without any side effects.  Her last examination was in April 2024.  No synovitis was noted on the examination today.  She denies any history of flares since the last visit.  High risk medication use - Plaquenil 200 mg 1 tablet p.o. twice daily. Plaquenil eye exam: 12/27/2022 -Labs obtained on August 23, 2022 CBC and CMP were normal.  Will check her labs today.  Plan: CBC with Differential/Platelet, COMPLETE METABOLIC PANEL WITH GFR.  Information on immunization was placed in the AVS.  Annual eye examination was advised to monitor for ocular toxicity.  Primary osteoarthritis of both hands-she had bilateral PIP and DIP thickening and CMC prominence.  Joint protection muscle strengthening was discussed.  She had no synovitis on the examination.  Ganglion cyst of flexor tendon sheath of finger of right hand  Trigger finger, right middle finger - resolved.  Status post closed fracture of left femur - she fell in February 2023 while she was in .  She had open reduction internal fixation of the right femur. followed by Dr. Linna Caprice.  She is doing well.  Trochanteric bursitis of both hips-she has intermittent discomfort in the trochanteric region.  She has been doing stretching exercises which has been helpful.  Status post total bilateral knee replacement -no warmth swelling or effusion was noted.  Right total knee replacement in 2009 and left total knee replacement in 2008.  Status post right ankle joint replacement -she has chronic thickening and decreased range of motion of her ankle joint.  Due to Charcot joint.  Osteopenia of multiple sites - T score -1.11 Jan 2020.  Use of  calcium rich diet and vitamin D was discussed.  Vitamin D deficiency -she takes 5 MND supplement.  Will check vitamin D level today.  Plan: VITAMIN D 25 Hydroxy (Vit-D Deficiency, Fractures)  Essential hypertension-blood pressure is normal at 114/72.  Other medical problems listed as follows:  Dyslipidemia  History of asthma-she is on Symbicort.  Gastroesophageal reflux disease without esophagitis  History of thyroid cancer  Orders: Orders  Placed This Encounter  Procedures   CBC with Differential/Platelet   COMPLETE METABOLIC PANEL WITH GFR   VITAMIN D 25 Hydroxy (Vit-D Deficiency, Fractures)   No orders of the defined types were placed in this encounter.   Follow-Up Instructions: Return in about 5 months (around 07/03/2023) for Rheumatoid arthritis, Osteoarthritis.   Pollyann Savoy, MD  Note - This record has been created using Animal nutritionist.  Chart creation errors have been sought, but may not always  have been located. Such creation errors do not reflect on  the standard of medical care.

## 2023-01-18 DIAGNOSIS — T8484XA Pain due to internal orthopedic prosthetic devices, implants and grafts, initial encounter: Secondary | ICD-10-CM | POA: Diagnosis not present

## 2023-01-18 DIAGNOSIS — Z96661 Presence of right artificial ankle joint: Secondary | ICD-10-CM | POA: Diagnosis not present

## 2023-01-18 DIAGNOSIS — M19071 Primary osteoarthritis, right ankle and foot: Secondary | ICD-10-CM | POA: Diagnosis not present

## 2023-01-23 ENCOUNTER — Other Ambulatory Visit: Payer: Self-pay | Admitting: Physician Assistant

## 2023-01-24 NOTE — Telephone Encounter (Signed)
Last Fill: 11/25/2022  Eye exam: 12/27/2022   Labs: 08/23/2022 CBC and CMP are normal except sodium is mildly decreased but improved.   Next Visit: 01/31/2023  Last Visit: 08/23/2022  [id number] arthritis of multiple sites with negative rheumatoid factor (HCC) + anti-CCP   Current Dose per office note on 08/23/2022: Plaquenil 200 mg 1 tablet p.o. twice daily.   Okay to refill Plaquenil?

## 2023-01-31 ENCOUNTER — Ambulatory Visit: Payer: Medicare Other | Attending: Rheumatology | Admitting: Rheumatology

## 2023-01-31 ENCOUNTER — Encounter: Payer: Self-pay | Admitting: Rheumatology

## 2023-01-31 VITALS — BP 114/72 | HR 74 | Resp 15 | Ht 66.5 in | Wt 193.0 lb

## 2023-01-31 DIAGNOSIS — E559 Vitamin D deficiency, unspecified: Secondary | ICD-10-CM | POA: Diagnosis not present

## 2023-01-31 DIAGNOSIS — Z96661 Presence of right artificial ankle joint: Secondary | ICD-10-CM | POA: Diagnosis not present

## 2023-01-31 DIAGNOSIS — M7062 Trochanteric bursitis, left hip: Secondary | ICD-10-CM

## 2023-01-31 DIAGNOSIS — Z8781 Personal history of (healed) traumatic fracture: Secondary | ICD-10-CM | POA: Diagnosis not present

## 2023-01-31 DIAGNOSIS — M67441 Ganglion, right hand: Secondary | ICD-10-CM

## 2023-01-31 DIAGNOSIS — I10 Essential (primary) hypertension: Secondary | ICD-10-CM

## 2023-01-31 DIAGNOSIS — M8589 Other specified disorders of bone density and structure, multiple sites: Secondary | ICD-10-CM

## 2023-01-31 DIAGNOSIS — M19041 Primary osteoarthritis, right hand: Secondary | ICD-10-CM | POA: Diagnosis not present

## 2023-01-31 DIAGNOSIS — E785 Hyperlipidemia, unspecified: Secondary | ICD-10-CM | POA: Diagnosis not present

## 2023-01-31 DIAGNOSIS — Z79899 Other long term (current) drug therapy: Secondary | ICD-10-CM

## 2023-01-31 DIAGNOSIS — M65331 Trigger finger, right middle finger: Secondary | ICD-10-CM

## 2023-01-31 DIAGNOSIS — M0609 Rheumatoid arthritis without rheumatoid factor, multiple sites: Secondary | ICD-10-CM

## 2023-01-31 DIAGNOSIS — Z8709 Personal history of other diseases of the respiratory system: Secondary | ICD-10-CM

## 2023-01-31 DIAGNOSIS — M7061 Trochanteric bursitis, right hip: Secondary | ICD-10-CM

## 2023-01-31 DIAGNOSIS — Z96653 Presence of artificial knee joint, bilateral: Secondary | ICD-10-CM

## 2023-01-31 DIAGNOSIS — K219 Gastro-esophageal reflux disease without esophagitis: Secondary | ICD-10-CM

## 2023-01-31 DIAGNOSIS — Z85850 Personal history of malignant neoplasm of thyroid: Secondary | ICD-10-CM

## 2023-01-31 DIAGNOSIS — M19042 Primary osteoarthritis, left hand: Secondary | ICD-10-CM

## 2023-01-31 LAB — CBC WITH DIFFERENTIAL/PLATELET
Basophils Absolute: 51 cells/uL (ref 0–200)
Eosinophils Absolute: 161 cells/uL (ref 15–500)
Eosinophils Relative: 2.2 %
MCH: 35.2 pg — ABNORMAL HIGH (ref 27.0–33.0)
MCHC: 35.4 g/dL (ref 32.0–36.0)
MCV: 99.3 fL (ref 80.0–100.0)
MPV: 9.6 fL (ref 7.5–12.5)
Neutro Abs: 4621 cells/uL (ref 1500–7800)
RBC: 4.01 10*6/uL (ref 3.80–5.10)
Total Lymphocyte: 24.6 %
WBC: 7.3 10*3/uL (ref 3.8–10.8)

## 2023-01-31 NOTE — Patient Instructions (Signed)
Standing Labs We placed an order today for your standing lab work.   Please have your standing labs drawn in October  Please have your labs drawn 2 weeks prior to your appointment so that the provider can discuss your lab results at your appointment, if possible.  Please note that you may see your imaging and lab results in MyChart before we have reviewed them. We will contact you once all results are reviewed. Please allow our office up to 72 hours to thoroughly review all of the results before contacting the office for clarification of your results.  WALK-IN LAB HOURS  Monday through Thursday from 8:00 am -12:30 pm and 1:00 pm-5:00 pm and Friday from 8:00 am-12:00 pm.  Patients with office visits requiring labs will be seen before walk-in labs.  You may encounter longer than normal wait times. Please allow additional time. Wait times may be shorter on  Monday and Thursday afternoons.  We do not book appointments for walk-in labs. We appreciate your patience and understanding with our staff.   Labs are drawn by [facility]. Please bring your co-pay at the time of your lab draw.  You may receive a bill from [facility] for your lab work.  Please note if you are on Hydroxychloroquine and and an order has been placed for a Hydroxychloroquine level,  you will need to have it drawn 4 hours or more after your last dose.  If you wish to have your labs drawn at another location, please call the office 24 hours in advance so we can fax the orders.  The office is located at [address], [address], [place], [place] [address]   If you have any questions regarding directions or hours of operation,  please call [phone].   As a reminder, please drink plenty of water prior to coming for your lab work. Thanks!  Vaccines You are taking a medication(s) that can suppress your immune system.  The following immunizations are recommended: Flu annually Covid-19  Td/Tdap (tetanus, diphtheria, pertussis) every  10 years Pneumonia (Prevnar 15 then Pneumovax 23 at least 1 year apart.  Alternatively, can take Prevnar 20 without needing additional dose) Shingrix: 2 doses from 4 weeks to 6 months apart  Please check with your PCP to make sure you are up to date.  

## 2023-02-01 LAB — COMPLETE METABOLIC PANEL WITH GFR
AG Ratio: 1.8 (calc) (ref 1.0–2.5)
ALT: 20 U/L (ref 6–29)
AST: 29 U/L (ref 10–35)
Albumin: 4.3 g/dL (ref 3.6–5.1)
Alkaline phosphatase (APISO): 67 U/L (ref 37–153)
BUN: 24 mg/dL (ref 7–25)
CO2: 25 mmol/L (ref 20–32)
Calcium: 9.5 mg/dL (ref 8.6–10.4)
Chloride: 103 mmol/L (ref 98–110)
Creat: 1.05 mg/dL (ref 0.50–1.05)
Globulin: 2.4 g/dL (calc) (ref 1.9–3.7)
Glucose, Bld: 96 mg/dL (ref 65–99)
Potassium: 4.2 mmol/L (ref 3.5–5.3)
Sodium: 136 mmol/L (ref 135–146)
Total Bilirubin: 0.5 mg/dL (ref 0.2–1.2)
Total Protein: 6.7 g/dL (ref 6.1–8.1)
eGFR: 58 mL/min/{1.73_m2} — ABNORMAL LOW (ref >=60)

## 2023-02-01 LAB — CBC WITH DIFFERENTIAL/PLATELET
Absolute Monocytes: 672 cells/uL (ref 200–950)
Basophils Relative: 0.7 %
HCT: 39.8 % (ref 35.0–45.0)
Hemoglobin: 14.1 g/dL (ref 11.7–15.5)
Lymphs Abs: 1796 cells/uL (ref 850–3900)
Monocytes Relative: 9.2 %
Neutrophils Relative %: 63.3 %
Platelets: 219 10*3/uL (ref 140–400)
RDW: 12.6 % (ref 11.0–15.0)

## 2023-02-01 LAB — VITAMIN D 25 HYDROXY (VIT D DEFICIENCY, FRACTURES): Vit D, 25-Hydroxy: 46 ng/mL (ref 30–100)

## 2023-02-01 NOTE — Progress Notes (Signed)
Vitamin D is in the desirable range.  CBC is normal.  Creatinine is mildly elevated.  We will continue to monitor labs.  Please forward results to her PCP.

## 2023-02-15 ENCOUNTER — Telehealth: Payer: Self-pay | Admitting: Family Medicine

## 2023-02-15 DIAGNOSIS — E890 Postprocedural hypothyroidism: Secondary | ICD-10-CM

## 2023-02-15 DIAGNOSIS — E785 Hyperlipidemia, unspecified: Secondary | ICD-10-CM

## 2023-02-15 NOTE — Telephone Encounter (Signed)
Pt is requesting labs to check:  Thyroid Cholesterol Etc...  Pt is asking if MD could put in some lab orders, so that she can be scheduled?  Pt was informed that MD is OOO until 02/20/23.  Pt stated that was fine and she would wait for a call back to schedule, once lab orders are put in.   LOV:  01/02/23 = CPE

## 2023-02-17 ENCOUNTER — Encounter: Payer: Self-pay | Admitting: Pulmonary Disease

## 2023-02-20 NOTE — Telephone Encounter (Signed)
Orders placed, okay to schedule lab appt.

## 2023-02-21 ENCOUNTER — Other Ambulatory Visit (INDEPENDENT_AMBULATORY_CARE_PROVIDER_SITE_OTHER): Payer: Medicare Other

## 2023-02-21 DIAGNOSIS — E785 Hyperlipidemia, unspecified: Secondary | ICD-10-CM

## 2023-02-21 DIAGNOSIS — E890 Postprocedural hypothyroidism: Secondary | ICD-10-CM | POA: Diagnosis not present

## 2023-02-21 LAB — LIPID PANEL
Cholesterol: 186 mg/dL (ref 0–200)
HDL: 82.9 mg/dL (ref >39.00)
LDL Cholesterol: 87 mg/dL (ref 0–99)
NonHDL: 103.03
Total CHOL/HDL Ratio: 2
Triglycerides: 81 mg/dL (ref 0.0–149.0)
VLDL: 16.2 mg/dL (ref 0.0–40.0)

## 2023-02-21 LAB — TSH: TSH: 2.34 u[IU]/mL (ref 0.35–5.50)

## 2023-03-03 ENCOUNTER — Other Ambulatory Visit: Payer: Self-pay | Admitting: Family Medicine

## 2023-03-27 ENCOUNTER — Encounter: Payer: Self-pay | Admitting: Family Medicine

## 2023-03-27 ENCOUNTER — Telehealth: Payer: Medicare Other | Admitting: Family Medicine

## 2023-03-27 VITALS — BP 120/72 | HR 60 | Temp 99.2°F | Wt 187.0 lb

## 2023-03-27 DIAGNOSIS — U071 COVID-19: Secondary | ICD-10-CM

## 2023-03-27 MED ORDER — NIRMATRELVIR/RITONAVIR (PAXLOVID)TABLET
3.0000 | ORAL_TABLET | Freq: Two times a day (BID) | ORAL | 0 refills | Status: AC
Start: 2023-03-27 — End: 2023-04-01

## 2023-03-27 NOTE — Progress Notes (Signed)
   Virtual Medical Office Visit  Patient:  Mercedes Hodge      Age: 67 y.o.       Sex:  female  Date:   03/27/2023  PCP:    [place], Betty G, MD   Today's Healthcare Provider: Karie Georges, MD    Assessment/Plan:   Summary assessment:  Mercedes Hodge was seen today for covid positive and sinus problem.  COVID-19 -     nirmatrelvir/ritonavir; Take 3 tablets by mouth 2 (two) times daily for 5 days. (Take nirmatrelvir 150 mg two tablets twice daily for 5 days and ritonavir 100 mg one tablet twice daily for 5 days) Patient GFR is 58 (01/31/23)  Dispense: 30 tablet; Refill: 0    Positive test this morning. Pt is >65 and is on plaquenil for RA. I recommended that she start paxlovid therapy to help reduce her symptoms, shorten the duration of the illness and reduce her risk of hospitalization. Pt is agreeable to starting the course of medication. We discussed risks/benefits and I advised that if her symptoms worsen she should be seen again.   Return if symptoms worsen or fail to improve.   She was advised to call the office or go to ER if her condition worsens    Subjective:   Mercedes Hodge is a 67 y.o. female with PMH significant for: Past Medical History:  Diagnosis Date   Allergy    Arthritis    Asthma    GERD (gastroesophageal reflux disease)    Hyperlipidemia    Hypertension    Thyroid cancer (HCC)    thyroid   Thyroid disease    Postsurgical hypothyroidism     Presenting today with: Chief Complaint  Patient presents with   Covid Positive    Patient states the home Covid test was positive this morning, tested due to neighbor visiting her home tested positive intially   Sinus Problem    Patient complains of sinus congestion and a headache noted overnight     She clarifies and reports that her condition: Patient reports just started feeling sick this morning, sinuses were aching, cough and sore throat. No fever chills, no chest pain. Thinks she got it from her  neighbor. No difficulty breathing. Took the test this morning and she was positive.   She denies having any: Sputum production, no difficulty swallowing,           Objective/Observations  Physical Exam:  Polite and friendly Gen: NAD, resting comfortably Pulm: Normal work of breathing Neuro: Grossly normal, moves all extremities Psych: Normal affect and thought content Problem specific physical exam findings:    No images are attached to the encounter or orders placed in the encounter.    Results: No results found for any visits on 03/27/23.        Virtual Visit via Video   I connected with Mercedes Hodge on 03/27/23 at  1:30 PM EDT by a video enabled telemedicine application and verified that I am speaking with the correct person using two identifiers. The limitations of evaluation and management by telemedicine and the availability of in person appointments were discussed. The patient expressed understanding and agreed to proceed.   Percentage of appointment time on video:  100% Patient location: Home Provider location: [place] [facility] Persons participating in the virtual visit: Myself and Patient

## 2023-03-28 ENCOUNTER — Other Ambulatory Visit: Payer: Self-pay | Admitting: Rheumatology

## 2023-03-29 ENCOUNTER — Encounter: Payer: Self-pay | Admitting: Family Medicine

## 2023-04-01 DIAGNOSIS — R509 Fever, unspecified: Secondary | ICD-10-CM | POA: Diagnosis not present

## 2023-04-01 DIAGNOSIS — R059 Cough, unspecified: Secondary | ICD-10-CM | POA: Diagnosis not present

## 2023-04-01 DIAGNOSIS — Z20822 Contact with and (suspected) exposure to covid-19: Secondary | ICD-10-CM | POA: Diagnosis not present

## 2023-04-01 DIAGNOSIS — R519 Headache, unspecified: Secondary | ICD-10-CM | POA: Diagnosis not present

## 2023-04-06 ENCOUNTER — Other Ambulatory Visit: Payer: Self-pay | Admitting: *Deleted

## 2023-04-06 MED ORDER — HYDROXYCHLOROQUINE SULFATE 200 MG PO TABS: 200.0000 mg | ORAL_TABLET | Freq: Two times a day (BID) | ORAL | 0 refills | Status: DC

## 2023-04-06 NOTE — Telephone Encounter (Signed)
Last Fill: 01/24/2023  Eye exam: 12/27/2022  WNL    Labs: 01/31/2023 CBC is normal.  Creatinine is mildly elevated.  We will continue to monitor labs.    Next Visit: 07/13/2023  Last Visit: 01/31/2023  [id number] arthritis of multiple sites with negative rheumatoid factor   Current Dose per office note 01/31/2023: Plaquenil 200 mg 1 tablet p.o. twice daily.   Okay to refill Plaquenil?

## 2023-05-16 ENCOUNTER — Other Ambulatory Visit: Payer: Self-pay | Admitting: Physician Assistant

## 2023-05-26 ENCOUNTER — Other Ambulatory Visit: Payer: Self-pay

## 2023-05-26 DIAGNOSIS — E890 Postprocedural hypothyroidism: Secondary | ICD-10-CM

## 2023-05-26 MED ORDER — LEVOTHYROXINE SODIUM 137 MCG PO TABS
ORAL_TABLET | ORAL | 2 refills | Status: DC
Start: 2023-05-26 — End: 2024-01-25

## 2023-06-07 ENCOUNTER — Ambulatory Visit: Payer: Medicare Other | Admitting: Gastroenterology

## 2023-06-07 ENCOUNTER — Encounter: Payer: Self-pay | Admitting: Gastroenterology

## 2023-06-07 VITALS — BP 120/80 | HR 68 | Ht 66.5 in | Wt 187.0 lb

## 2023-06-07 DIAGNOSIS — K648 Other hemorrhoids: Secondary | ICD-10-CM

## 2023-06-07 MED ORDER — PRAMOXINE-HC 1-1 % EX CREA: TOPICAL_CREAM | Freq: Two times a day (BID) | CUTANEOUS | 6 refills | Status: AC | PRN

## 2023-06-07 NOTE — Patient Instructions (Addendum)
Use Benefiber twice a day  We have sent the following medications to your pharmacy for you to pick up at your convenience: Analpram   Follow up as needed  _______________________________________________________  If your blood pressure at your visit was 140/90 or greater, please contact your primary care physician to follow up on this.  _______________________________________________________  If you are age 69 or older, your body mass index should be between 23-30. Your Body mass index is 29.73 kg/m. If this is out of the aforementioned range listed, please consider follow up with your Primary Care Provider.  If you are age 67 or younger, your body mass index should be between 19-25. Your Body mass index is 29.73 kg/m. If this is out of the aformentioned range listed, please consider follow up with your Primary Care Provider.   ________________________________________________________  The [facility] providers would like to encourage you to use [facility] to communicate with providers for non-urgent requests or questions.  Due to long hold times on the telephone, sending your provider a message by [facility] may be a faster and more efficient way to get a response.  Please allow 48 business hours for a response.  Please remember that this is for non-urgent requests.  _______________________________________________________   I appreciate the  opportunity to care for you  Thank You   Marsa Aris , MD

## 2023-06-16 ENCOUNTER — Other Ambulatory Visit: Payer: Self-pay | Admitting: Physician Assistant

## 2023-06-19 NOTE — Telephone Encounter (Signed)
Last Fill: 04/06/2023  Eye exam: 12/27/2022  WNL    Labs: 01/31/2023  CBC is normal.  Creatinine is mildly elevated.     Next Visit: 07/13/2023  Last Visit: 01/31/2023  DX: Rheumatoid arthritis of multiple sites with negative rheumatoid factor   Current Dose per office note 01/31/2023: Plaquenil 200 mg 1 tablet p.o. twice daily.   Patient to update labs at upcoming appointment on 07/13/2023  Okay to refill Plaquenil?

## 2023-07-10 NOTE — Progress Notes (Unsigned)
Office Visit Note  Patient: Mercedes Hodge             Date of Birth: 1956/04/20           MRN: [id number]             PCP: [place], Betty G, MD Referring: [place], Betty G, MD Visit Date: 07/13/2023 Occupation: @GUAROCC @  Subjective:  No chief complaint on file.   History of Present Illness: Mercedes Hodge is a 67 y.o. female ***     Activities of Daily Living:  Patient reports morning stiffness for *** {minute/hour:19697}.   Patient {ACTIONS;DENIES/REPORTS:21021675::"Denies"} nocturnal pain.  Difficulty dressing/grooming: {ACTIONS;DENIES/REPORTS:21021675::"Denies"} Difficulty climbing stairs: {ACTIONS;DENIES/REPORTS:21021675::"Denies"} Difficulty getting out of chair: {ACTIONS;DENIES/REPORTS:21021675::"Denies"} Difficulty using hands for taps, buttons, cutlery, and/or writing: {ACTIONS;DENIES/REPORTS:21021675::"Denies"}  No Rheumatology ROS completed.   PMFS History:  Patient Active Problem List   Diagnosis Date Noted   Routine general medical examination at a health care facility 01/02/2023   Ganglion cyst of flexor tendon sheath 03/04/2022   Depression, major, in remission (HCC) 06/29/2021   Aortic atherosclerosis (HCC) 08/18/2020   History of colonic polyps 07/20/2020   Atrophic vaginitis 02/17/2020   Rheumatoid arthritis of multiple sites with negative rheumatoid factor (HCC) 07/02/2019   High risk medication use 07/02/2019   Primary osteoarthritis of both hands 07/02/2019   Status post total bilateral knee replacement 07/02/2019   Status post right ankle joint replacement 07/02/2019   Essential hypertension 07/02/2019   Asthma, intermittent, uncomplicated 07/02/2019   History of thyroid cancer 07/02/2019   Postoperative hypothyroidism 07/02/2019   Dyslipidemia 07/02/2019   Gastroesophageal reflux disease without esophagitis 07/02/2019   Osteopenia of multiple sites 07/02/2019    Past Medical History:  Diagnosis Date   Allergy    Arthritis     Asthma    GERD (gastroesophageal reflux disease)    Hyperlipidemia    Hypertension    Thyroid cancer (HCC)    thyroid   Thyroid disease    Postsurgical hypothyroidism    Family History  Problem Relation Age of Onset   COPD Mother    Heart disease Mother    Cancer Father        thyroid cancer   Juvenile Rhematoid Arthritis Sister    Stroke Sister    Thyroid cancer Brother    Thyroid cancer Daughter    Healthy Daughter    Healthy Son    Healthy Daughter    Colon cancer Neg Hx    Esophageal cancer Neg Hx    Pancreatic cancer Neg Hx    Stomach cancer Neg Hx    Liver disease Neg Hx    Past Surgical History:  Procedure Laterality Date   ABDOMINAL HYSTERECTOMY  1993   CHOLECYSTECTOMY  1982   EXTRACORPOREAL SHOCK WAVE LITHOTRIPSY Left 09/17/2020   Procedure: EXTRACORPOREAL SHOCK WAVE LITHOTRIPSY (ESWL);  Surgeon: Marcine Matar, MD;  Location: [facility];  Service: Urology;  Laterality: Left;   LAPAROSCOPIC GASTRIC SLEEVE RESECTION  07/2018   left knee replacement  2008   ORIF FEMUR FRACTURE  04/13/2022   partial knee revision  04/13/2022   right ankle replacement  2012   right knee replacement  2009   trimallar FX  1995   right ankle   Social History   Social History Narrative   Not on file   Immunization History  Administered Date(s) Administered   Fluad Quad(high Dose 65+) 06/29/2021   Influenza-Unspecified 05/22/2019, 06/10/2020, 06/27/2022   Moderna Covid-19 Fall Seasonal Vaccine 43yrs &  older 03/15/2023   PFIZER Comirnaty(Gray Top)Covid-19 Tri-Sucrose Vaccine 05/08/2021   PFIZER(Purple Top)SARS-COV-2 Vaccination 11/17/2019, 12/08/2019, 06/10/2020   Pfizer Covid-19 Vaccine Bivalent Booster 35yrs & up 08/19/2021   Pneumococcal Conjugate-13 06/07/2019   Pneumococcal Polysaccharide-23 08/18/2020   Respiratory Syncytial Virus Vaccine,Recomb Aduvanted(Arexvy) 06/18/2022   Tdap 06/07/2019   Zoster Recombinant(Shingrix) 12/23/2020, 02/27/2021      Objective: Vital Signs: There were no vitals taken for this visit.   Physical Exam   Musculoskeletal Exam: ***  CDAI Exam: CDAI Score: -- Patient Global: --; Provider Global: -- Swollen: --; Tender: -- Joint Exam 07/13/2023   No joint exam has been documented for this visit   There is currently no information documented on the homunculus. Go to the Rheumatology activity and complete the homunculus joint exam.  Investigation: No additional findings.  Imaging: No results found.  Recent Labs: Lab Results  Component Value Date   WBC 7.3 01/31/2023   HGB 14.1 01/31/2023   PLT 219 01/31/2023   NA 136 01/31/2023   K 4.2 01/31/2023   CL 103 01/31/2023   CO2 25 01/31/2023   GLUCOSE 96 01/31/2023   BUN 24 01/31/2023   CREATININE 1.05 01/31/2023   BILITOT 0.5 01/31/2023   AST 29 01/31/2023   ALT 20 01/31/2023   PROT 6.7 01/31/2023   CALCIUM 9.5 01/31/2023   GFRAA 93 11/27/2020    Speciality Comments: Plaquenil eye exam: 12/27/2022  [facility]  Follow up in 1 year DEXA at [facility] through PCP  Procedures:  No procedures performed Allergies: Lovenox [enoxaparin sodium]   Assessment / Plan:     Visit Diagnoses: No diagnosis found.  Orders: No orders of the defined types were placed in this encounter.  No orders of the defined types were placed in this encounter.   Face-to-face time spent with patient was *** minutes. Greater than 50% of time was spent in counseling and coordination of care.  Follow-Up Instructions: No follow-ups on file.   Ellen Henri, CMA  Note - This record has been created using Animal nutritionist.  Chart creation errors have been sought, but may not always  have been located. Such creation errors do not reflect on  the standard of medical care.

## 2023-07-13 ENCOUNTER — Ambulatory Visit: Payer: Medicare Other | Attending: Rheumatology | Admitting: Rheumatology

## 2023-07-13 ENCOUNTER — Encounter: Payer: Self-pay | Admitting: Rheumatology

## 2023-07-13 VITALS — BP 117/74 | HR 66 | Resp 14 | Ht 67.0 in | Wt 188.4 lb

## 2023-07-13 DIAGNOSIS — Z8781 Personal history of (healed) traumatic fracture: Secondary | ICD-10-CM

## 2023-07-13 DIAGNOSIS — Z8709 Personal history of other diseases of the respiratory system: Secondary | ICD-10-CM | POA: Diagnosis not present

## 2023-07-13 DIAGNOSIS — M19041 Primary osteoarthritis, right hand: Secondary | ICD-10-CM | POA: Diagnosis not present

## 2023-07-13 DIAGNOSIS — M7062 Trochanteric bursitis, left hip: Secondary | ICD-10-CM

## 2023-07-13 DIAGNOSIS — K219 Gastro-esophageal reflux disease without esophagitis: Secondary | ICD-10-CM

## 2023-07-13 DIAGNOSIS — M7061 Trochanteric bursitis, right hip: Secondary | ICD-10-CM

## 2023-07-13 DIAGNOSIS — Z96653 Presence of artificial knee joint, bilateral: Secondary | ICD-10-CM | POA: Diagnosis not present

## 2023-07-13 DIAGNOSIS — M0609 Rheumatoid arthritis without rheumatoid factor, multiple sites: Secondary | ICD-10-CM

## 2023-07-13 DIAGNOSIS — Z85850 Personal history of malignant neoplasm of thyroid: Secondary | ICD-10-CM

## 2023-07-13 DIAGNOSIS — Z79899 Other long term (current) drug therapy: Secondary | ICD-10-CM

## 2023-07-13 DIAGNOSIS — E559 Vitamin D deficiency, unspecified: Secondary | ICD-10-CM | POA: Diagnosis not present

## 2023-07-13 DIAGNOSIS — I10 Essential (primary) hypertension: Secondary | ICD-10-CM

## 2023-07-13 DIAGNOSIS — M19042 Primary osteoarthritis, left hand: Secondary | ICD-10-CM

## 2023-07-13 DIAGNOSIS — M8589 Other specified disorders of bone density and structure, multiple sites: Secondary | ICD-10-CM | POA: Diagnosis not present

## 2023-07-13 DIAGNOSIS — M65331 Trigger finger, right middle finger: Secondary | ICD-10-CM | POA: Diagnosis not present

## 2023-07-13 DIAGNOSIS — E785 Hyperlipidemia, unspecified: Secondary | ICD-10-CM

## 2023-07-13 DIAGNOSIS — Z96661 Presence of right artificial ankle joint: Secondary | ICD-10-CM | POA: Diagnosis not present

## 2023-07-13 DIAGNOSIS — M67441 Ganglion, right hand: Secondary | ICD-10-CM

## 2023-07-13 NOTE — Patient Instructions (Signed)
Vaccines You are taking a medication(s) that can suppress your immune system.  The following immunizations are recommended: Flu annually Covid-19  Td/Tdap (tetanus, diphtheria, pertussis) every 10 years Pneumonia (Prevnar 15 then Pneumovax 23 at least 1 year apart.  Alternatively, can take Prevnar 20 without needing additional dose) Shingrix: 2 doses from 4 weeks to 6 months apart  Please check with your PCP to make sure you are up to date.  

## 2023-07-14 LAB — COMPLETE METABOLIC PANEL WITHOUT GFR
AG Ratio: 1.6 (calc) (ref 1.0–2.5)
ALT: 18 U/L (ref 6–29)
AST: 28 U/L (ref 10–35)
Albumin: 4.2 g/dL (ref 3.6–5.1)
Alkaline phosphatase (APISO): 64 U/L (ref 37–153)
BUN/Creatinine Ratio: 28 (calc) — ABNORMAL HIGH (ref 6–22)
BUN: 26 mg/dL — ABNORMAL HIGH (ref 7–25)
CO2: 26 mmol/L (ref 20–32)
Calcium: 9.2 mg/dL (ref 8.6–10.4)
Chloride: 103 mmol/L (ref 98–110)
Creat: 0.92 mg/dL (ref 0.50–1.05)
Globulin: 2.7 g/dL (ref 1.9–3.7)
Glucose, Bld: 96 mg/dL (ref 65–99)
Potassium: 4.2 mmol/L (ref 3.5–5.3)
Sodium: 137 mmol/L (ref 135–146)
Total Bilirubin: 0.5 mg/dL (ref 0.2–1.2)
Total Protein: 6.9 g/dL (ref 6.1–8.1)
eGFR: 67 mL/min/1.73m2

## 2023-07-14 LAB — CBC WITH DIFFERENTIAL/PLATELET
Absolute Lymphocytes: 1531 {cells}/uL (ref 850–3900)
Absolute Monocytes: 655 {cells}/uL (ref 200–950)
Basophils Absolute: 38 {cells}/uL (ref 0–200)
Basophils Relative: 0.6 %
Eosinophils Absolute: 139 {cells}/uL (ref 15–500)
Eosinophils Relative: 2.2 %
HCT: 40.4 % (ref 35.0–45.0)
Hemoglobin: 13.3 g/dL (ref 11.7–15.5)
MCH: 32.8 pg (ref 27.0–33.0)
MCHC: 32.9 g/dL (ref 32.0–36.0)
MCV: 99.8 fL (ref 80.0–100.0)
MPV: 9.7 fL (ref 7.5–12.5)
Monocytes Relative: 10.4 %
Neutro Abs: 3938 {cells}/uL (ref 1500–7800)
Neutrophils Relative %: 62.5 %
Platelets: 193 Thousand/uL (ref 140–400)
RBC: 4.05 Million/uL (ref 3.80–5.10)
RDW: 12.1 % (ref 11.0–15.0)
Total Lymphocyte: 24.3 %
WBC: 6.3 Thousand/uL (ref 3.8–10.8)

## 2023-07-14 NOTE — Progress Notes (Signed)
CBC and CMP normal

## 2023-08-04 DIAGNOSIS — L820 Inflamed seborrheic keratosis: Secondary | ICD-10-CM | POA: Diagnosis not present

## 2023-08-04 DIAGNOSIS — L814 Other melanin hyperpigmentation: Secondary | ICD-10-CM | POA: Diagnosis not present

## 2023-08-04 DIAGNOSIS — D1801 Hemangioma of skin and subcutaneous tissue: Secondary | ICD-10-CM | POA: Diagnosis not present

## 2023-08-04 DIAGNOSIS — L821 Other seborrheic keratosis: Secondary | ICD-10-CM | POA: Diagnosis not present

## 2023-08-04 DIAGNOSIS — L570 Actinic keratosis: Secondary | ICD-10-CM | POA: Diagnosis not present

## 2023-08-13 ENCOUNTER — Other Ambulatory Visit: Payer: Self-pay | Admitting: Physician Assistant

## 2023-08-22 ENCOUNTER — Other Ambulatory Visit: Payer: Self-pay | Admitting: Family Medicine

## 2023-08-22 DIAGNOSIS — Z1231 Encounter for screening mammogram for malignant neoplasm of breast: Secondary | ICD-10-CM

## 2023-08-29 ENCOUNTER — Other Ambulatory Visit: Payer: Self-pay | Admitting: Physician Assistant

## 2023-08-29 DIAGNOSIS — H2513 Age-related nuclear cataract, bilateral: Secondary | ICD-10-CM | POA: Diagnosis not present

## 2023-08-29 DIAGNOSIS — H40033 Anatomical narrow angle, bilateral: Secondary | ICD-10-CM | POA: Diagnosis not present

## 2023-08-29 NOTE — Telephone Encounter (Signed)
Last Fill: 06/19/2023  Eye exam: 12/27/2022  WNL    Labs: 07/13/2023 CBC and CMP normal.   Next Visit: 12/13/2023  Last Visit: 07/13/2023  WJ:[id number] arthritis of multiple sites with negative rheumatoid factor   Current Dose per office note 07/13/2023: Plaquenil 200 mg 1 tablet p.o. twice daily.   Okay to refill Plaquenil?

## 2023-09-13 ENCOUNTER — Other Ambulatory Visit: Payer: Self-pay | Admitting: Family Medicine

## 2023-09-13 DIAGNOSIS — I10 Essential (primary) hypertension: Secondary | ICD-10-CM

## 2023-09-29 ENCOUNTER — Ambulatory Visit (INDEPENDENT_AMBULATORY_CARE_PROVIDER_SITE_OTHER): Payer: Medicare Other | Admitting: Family Medicine

## 2023-09-29 ENCOUNTER — Encounter: Payer: Self-pay | Admitting: Family Medicine

## 2023-09-29 VITALS — BP 110/70 | HR 64 | Resp 16 | Ht 67.0 in | Wt 188.2 lb

## 2023-09-29 DIAGNOSIS — R300 Dysuria: Secondary | ICD-10-CM

## 2023-09-29 DIAGNOSIS — N952 Postmenopausal atrophic vaginitis: Secondary | ICD-10-CM | POA: Diagnosis not present

## 2023-09-29 LAB — POC URINALSYSI DIPSTICK (AUTOMATED)
Bilirubin, UA: NEGATIVE
Blood, UA: POSITIVE
Glucose, UA: NEGATIVE
Ketones, UA: NEGATIVE
Nitrite, UA: NEGATIVE
Protein, UA: NEGATIVE
Spec Grav, UA: 1.01 (ref 1.010–1.025)
Urobilinogen, UA: 0.2 U/dL
pH, UA: 6.5 (ref 5.0–8.0)

## 2023-09-29 MED ORDER — SULFAMETHOXAZOLE-TRIMETHOPRIM 800-160 MG PO TABS: 1.0000 | ORAL_TABLET | Freq: Two times a day (BID) | ORAL | 0 refills | Status: AC

## 2023-09-29 NOTE — Patient Instructions (Addendum)
A few things to remember from today's visit:  Dysuria - Plan: POCT Urinalysis Dipstick (Automated), Culture, Urine  Atrophic vaginitis Monitor for new symptoms. Use estrogen cream daily for a week,small amount, then 2-3 times per week. Will follow urine culture. If persistent pelvic exam will be needed.  If you need refills for medications you take chronically, please call your pharmacy. Do not use My Chart to request refills or for acute issues that need immediate attention. If you send a my chart message, it may take a few days to be addressed, specially if I am not in the office.  Please be sure medication list is accurate. If a new problem present, please set up appointment sooner than planned today.

## 2023-09-29 NOTE — Progress Notes (Signed)
ACUTE VISIT Chief Complaint  Patient presents with   Urinary Tract Infection    X about a week, comes & goes. Has tried using vaginal cream Monday & symptoms came back    HPI: [name] is a 68 y.o. female with a PMHx significant for hypothyroidism, OA, asthma, GERD, HTN, aortic atherosclerosis, and dyslipidemia, among others, who is here today complaining of UTI symptoms intermittently for a week.   She endorses constant burning, as well as some increased frequency yesterday.   Dysuria  This is a recurrent problem. The current episode started 1 to 4 weeks ago. The problem has been waxing and waning. The quality of the pain is described as burning. The pain is mild. There has been no fever. There is No history of pyelonephritis. Associated symptoms include frequency. Pertinent negatives include no chills, discharge, flank pain, hematuria, hesitancy, nausea, sweats, urgency or vomiting. Her past medical history is significant for recurrent UTIs.   She has been using an estradiol cream prescribed by urology in the past which has helped. She used it on Monday which helped with her symptoms on Tuesday and Wednesday. She used it again yesterday because symptoms returned and is not having symptoms today. She has not taken anything else for her symptoms.   She asks if her symptoms could be related to being in the pool at the [facility] for water aerobics.   Pertinent negatives include abdominal pain, blood in her urine, chills, unusual back pain,vaginal bleeding, genital lesions, redness, swelling, or  pruritus. No new soaps, detergents, or underwear.   Review of Systems  Constitutional:  Negative for chills.  HENT:  Negative for mouth sores and sore throat.   Respiratory:  Negative for shortness of breath.   Cardiovascular:  Negative for leg swelling.  Gastrointestinal:  Negative for nausea and vomiting.  Genitourinary:  Positive for dysuria and frequency. Negative for flank pain,  hematuria, hesitancy, pelvic pain and urgency.  Skin:  Negative for rash.  Neurological:  Negative for syncope and weakness.  Psychiatric/Behavioral:  Negative for confusion and hallucinations.   See other pertinent positives and negatives in HPI.  Current Outpatient Medications on File Prior to Visit  Medication Sig Dispense Refill   amLODipine-benazepril (LOTREL) 5-40 MG capsule TAKE 1 CAPSULE BY MOUTH DAILY 100 capsule 2   calcium carbonate (OS-CAL) 600 MG tablet Take 600 mg by mouth daily.     Cholecalciferol (VITAMIN D) 50 MCG (2000 UT) tablet Take 2,000 Units by mouth daily.     diclofenac sodium (VOLTAREN) 1 % GEL Apply topically as needed.     estradiol (ESTRACE) 0.1 MG/GM vaginal cream Place 1 Applicatorful vaginally once a week. (Patient taking differently: Place 1 Applicatorful vaginally as needed.) 42.5 g 3   fluticasone (FLONASE) 50 MCG/ACT nasal spray Place into both nostrils as needed for allergies or rhinitis.     hydroxychloroquine (PLAQUENIL) 200 MG tablet TAKE 1 TABLET BY MOUTH TWICE  DAILY 180 tablet 0   levothyroxine (SYNTHROID) 137 MCG tablet Take 1 tablet by mouth daily except take 1/2 tablet on Tuesdays & Thursdays. 90 tablet 2   pramoxine-hydrocortisone (ANALPRAM HC) cream Apply topically 2 (two) times daily as needed. For 7 days 30 g 6   pravastatin (PRAVACHOL) 40 MG tablet TAKE 1 TABLET BY MOUTH DAILY 100 tablet 2   SYMBICORT 160-4.5 MCG/ACT inhaler USE 2 INHALATIONS BY MOUTH TWICE DAILY 30.6 g 3   No current facility-administered medications on file prior to visit.    Past  Medical History:  Diagnosis Date   Allergy    Arthritis    Asthma    GERD (gastroesophageal reflux disease)    Hyperlipidemia    Hypertension    Thyroid cancer (HCC)    thyroid   Thyroid disease    Postsurgical hypothyroidism   Allergies  Allergen Reactions   Lovenox [Enoxaparin Sodium] Rash    rash    Social History   Socioeconomic History   Marital status: Married     Spouse name: Not on file   Number of children: Not on file   Years of education: Not on file   Highest education level: Not on file  Occupational History   Not on file  Tobacco Use   Smoking status: Never    Passive exposure: Past   Smokeless tobacco: Never  Vaping Use   Vaping status: Never Used  Substance and Sexual Activity   Alcohol use: Yes    Comment: occ wine    Drug use: Never   Sexual activity: Yes    Birth control/protection: None  Other Topics Concern   Not on file  Social History Narrative   Not on file   Social Drivers of Health   Financial Resource Strain: Low Risk  (10/12/2022)   Overall Financial Resource Strain (CARDIA)    Difficulty of Paying Living Expenses: Not hard at all  Food Insecurity: No Food Insecurity (10/12/2022)   Hunger Vital Sign    Worried About Running Out of Food in the Last Year: Never true    Ran Out of Food in the Last Year: Never true  Transportation Needs: No Transportation Needs (10/12/2022)   PRAPARE - Administrator, Civil Service (Medical): No    Lack of Transportation (Non-Medical): No  Physical Activity: Sufficiently Active (10/12/2022)   Exercise Vital Sign    Days of Exercise per Week: 3 days    Minutes of Exercise per Session: 60 min  Stress: No Stress Concern Present (10/12/2022)   [employer] of Occupational Health - Occupational Stress Questionnaire    Feeling of Stress : Not at all  Social Connections: Moderately Isolated (10/12/2022)   Social Connection and Isolation Panel [NHANES]    Frequency of Communication with Friends and Family: More than three times a week    Frequency of Social Gatherings with Friends and Family: Three times a week    Attends Religious Services: Never    Active Member of Clubs or Organizations: No    Attends Banker Meetings: Never    Marital Status: Married    Vitals:   09/29/23 1501  BP: 110/70  Pulse: 64  Resp: 16  SpO2: 98%   Body mass index is 29.48  kg/m.  Physical Exam Vitals and nursing note reviewed.  Constitutional:      General: She is not in acute distress.    Appearance: She is well-developed.  HENT:     Head: Atraumatic.  Eyes:     Conjunctiva/sclera: Conjunctivae normal.  Cardiovascular:     Rate and Rhythm: Normal rate and regular rhythm.  Pulmonary:     Effort: Pulmonary effort is normal. No respiratory distress.     Breath sounds: Normal breath sounds.  Abdominal:     Palpations: Abdomen is soft. There is no mass.     Tenderness: There is no abdominal tenderness. There is no right CVA tenderness or left CVA tenderness.  Musculoskeletal:     Right lower leg: No edema.     Left  lower leg: No edema.  Skin:    General: Skin is warm.     Findings: No erythema.  Neurological:     Mental Status: She is alert and oriented to person, place, and time.  Psychiatric:        Mood and Affect: Mood and affect normal.    ASSESSMENT AND PLAN:  Ms. Morini was seen today for a possible UTI.   Dysuria We discussed possible etiologies, problem is intermittent, not always with urination. Urine dipstick positive blood and leukocytes 2+. Symptoms have improved today. She agrees on holding on abx treatment until Ucx is back. I sent Rx for Bactrim DS to start if symptoms reoccurred during this weekend Instructed about warning signs.  -     POCT Urinalysis Dipstick (Automated) -     Urine Culture; Future  Other orders -     Sulfamethoxazole-Trimethoprim; Take 1 tablet by mouth 2 (two) times daily for 3 days.  Dispense: 6 tablet; Refill: 0  Atrophic vaginitis This problem could be causing/contributing to above symptom. Recommend daily estradiol vaginal cream for a week then 2-3 times per week. If problem is persistent may need consultation with gynecologist.  Return if symptoms worsen or fail to improve.  I, Rolla Etienne Wierda, acting as a scribe for Nicolet Griffy [place], MD., have documented all relevant documentation on the  behalf of Hazelgrace Bonham [place], MD, as directed by  Deziya Amero [place], MD while in the presence of Sally-Ann Cutbirth [place], MD.   I, Shweta Aman [place], MD, have reviewed all documentation for this visit. The documentation on 09/29/23 for the exam, diagnosis, procedures, and orders are all accurate and complete.  Gurtaj Ruz G. [place], MD  [facility]. [place] office.

## 2023-09-30 ENCOUNTER — Encounter: Payer: Self-pay | Admitting: Family Medicine

## 2023-09-30 LAB — URINE CULTURE
MICRO NUMBER:: 15970341
Result:: NO GROWTH
SPECIMEN QUALITY:: ADEQUATE

## 2023-10-02 ENCOUNTER — Other Ambulatory Visit: Payer: Self-pay | Admitting: Family Medicine

## 2023-10-02 ENCOUNTER — Encounter: Payer: Self-pay | Admitting: Family Medicine

## 2023-10-02 DIAGNOSIS — J4520 Mild intermittent asthma, uncomplicated: Secondary | ICD-10-CM

## 2023-10-02 MED ORDER — FLUTICASONE-SALMETEROL 250-50 MCG/ACT IN AEPB: 1.0000 | INHALATION_SPRAY | Freq: Two times a day (BID) | RESPIRATORY_TRACT | 2 refills | Status: AC

## 2023-10-03 ENCOUNTER — Encounter: Payer: Self-pay | Admitting: Gastroenterology

## 2023-10-10 ENCOUNTER — Ambulatory Visit
Admission: RE | Admit: 2023-10-10 | Discharge: 2023-10-10 | Disposition: A | Discharge status: Home / Self Care | Payer: Medicare Other | Source: Ambulatory Visit | Attending: Family Medicine | Admitting: Family Medicine

## 2023-10-10 DIAGNOSIS — Z1231 Encounter for screening mammogram for malignant neoplasm of breast: Secondary | ICD-10-CM | POA: Diagnosis not present

## 2023-10-16 ENCOUNTER — Ambulatory Visit (INDEPENDENT_AMBULATORY_CARE_PROVIDER_SITE_OTHER): Payer: Medicare Other

## 2023-10-16 VITALS — Ht 66.0 in | Wt 182.0 lb

## 2023-10-16 DIAGNOSIS — Z0000 Encounter for general adult medical examination without abnormal findings: Secondary | ICD-10-CM | POA: Diagnosis not present

## 2023-10-16 NOTE — Progress Notes (Cosign Needed)
Subjective:   Mercedes Hodge is a 68 y.o. female who presents for Medicare Annual (Subsequent) preventive examination.  Visit Complete: Virtual I connected with  [name] on 10/16/23 by a audio enabled telemedicine application and verified that I am speaking with the correct person using two identifiers.  Patient Location: Home  Provider Location: Home Office  I discussed the limitations of evaluation and management by telemedicine. The patient expressed understanding and agreed to proceed.  Vital Signs: Because this visit was a virtual/telehealth visit, some criteria may be missing or patient reported. Any vitals not documented were not able to be obtained and vitals that have been documented are patient reported.    Cardiac Risk Factors include: advanced age (>29men, >95 women);hypertension     Objective:    Today's Vitals   10/16/23 1352  Weight: 182 lb (82.6 kg)  Height: 5\' 6"  (1.676 m)   Body mass index is 29.38 kg/m.     10/16/2023    1:57 PM 10/12/2022   11:14 AM 10/04/2021    2:45 PM 10/19/2020    1:28 PM 09/17/2020    7:35 AM  Advanced Directives  Does Patient Have a Medical Advance Directive? Yes Yes Yes Yes Yes  Type of Estate agent of [place];Living will Healthcare Power of [place];Living will Healthcare Power of [place];Living will Living will;Healthcare Power of Attorney   Does patient want to make changes to medical advance directive?   No - Patient declined No - Patient declined No - Patient declined  Copy of Healthcare Power of Attorney in Chart? No - copy requested No - copy requested No - copy requested No - copy requested     Current Medications (verified) Outpatient Encounter Medications as of 10/16/2023  Medication Sig   fluticasone-salmeterol (WIXELA INHUB) 250-50 MCG/ACT AEPB Inhale 1 puff into the lungs in the morning and at bedtime.   amLODipine-benazepril (LOTREL) 5-40 MG capsule TAKE 1 CAPSULE BY MOUTH DAILY    calcium carbonate (OS-CAL) 600 MG tablet Take 600 mg by mouth daily.   Cholecalciferol (VITAMIN D) 50 MCG (2000 UT) tablet Take 2,000 Units by mouth daily.   diclofenac sodium (VOLTAREN) 1 % GEL Apply topically as needed.   estradiol (ESTRACE) 0.1 MG/GM vaginal cream Place 1 Applicatorful vaginally once a week. (Patient taking differently: Place 1 Applicatorful vaginally as needed.)   fluticasone (FLONASE) 50 MCG/ACT nasal spray Place into both nostrils as needed for allergies or rhinitis.   hydroxychloroquine (PLAQUENIL) 200 MG tablet TAKE 1 TABLET BY MOUTH TWICE  DAILY   levothyroxine (SYNTHROID) 137 MCG tablet Take 1 tablet by mouth daily except take 1/2 tablet on Tuesdays & Thursdays.   pramoxine-hydrocortisone (ANALPRAM HC) cream Apply topically 2 (two) times daily as needed. For 7 days   pravastatin (PRAVACHOL) 40 MG tablet TAKE 1 TABLET BY MOUTH DAILY   No facility-administered encounter medications on file as of 10/16/2023.    Allergies (verified) Lovenox [enoxaparin sodium]   History: Past Medical History:  Diagnosis Date   Allergy    Arthritis    Asthma    GERD (gastroesophageal reflux disease)    Hyperlipidemia    Hypertension    Thyroid cancer (HCC)    thyroid   Thyroid disease    Postsurgical hypothyroidism   Past Surgical History:  Procedure Laterality Date   ABDOMINAL HYSTERECTOMY  1993   CHOLECYSTECTOMY  1982   EXTRACORPOREAL SHOCK WAVE LITHOTRIPSY Left 09/17/2020   Procedure: EXTRACORPOREAL SHOCK WAVE LITHOTRIPSY (ESWL);  Surgeon: Retta Diones,  Jeannett Senior, MD;  Location: [facility];  Service: Urology;  Laterality: Left;   LAPAROSCOPIC GASTRIC SLEEVE RESECTION  07/2018   left knee replacement  2008   ORIF FEMUR FRACTURE  04/13/2022   partial knee revision  04/13/2022   right ankle replacement  2012   right knee replacement  2009   trimallar FX  1995   right ankle   Family History  Problem Relation Age of Onset   COPD Mother    Heart disease  Mother    Cancer Father        thyroid cancer   Juvenile Rhematoid Arthritis Sister    Stroke Sister    Thyroid cancer Brother    Thyroid cancer Daughter    Healthy Daughter    Healthy Son    Healthy Daughter    Colon cancer Neg Hx    Esophageal cancer Neg Hx    Pancreatic cancer Neg Hx    Stomach cancer Neg Hx    Liver disease Neg Hx    Social History   Socioeconomic History   Marital status: Married    Spouse name: Not on file   Number of children: Not on file   Years of education: Not on file   Highest education level: Not on file  Occupational History   Not on file  Tobacco Use   Smoking status: Never    Passive exposure: Past   Smokeless tobacco: Never  Vaping Use   Vaping status: Never Used  Substance and Sexual Activity   Alcohol use: Yes    Comment: occ wine    Drug use: Never   Sexual activity: Yes    Birth control/protection: None  Other Topics Concern   Not on file  Social History Narrative   Not on file   Social Drivers of Health   Financial Resource Strain: Low Risk  (10/16/2023)   Overall Financial Resource Strain (CARDIA)    Difficulty of Paying Living Expenses: Not hard at all  Food Insecurity: No Food Insecurity (10/16/2023)   Hunger Vital Sign    Worried About Running Out of Food in the Last Year: Never true    Ran Out of Food in the Last Year: Never true  Transportation Needs: No Transportation Needs (10/16/2023)   PRAPARE - Administrator, Civil Service (Medical): No    Lack of Transportation (Non-Medical): No  Physical Activity: Sufficiently Active (10/16/2023)   Exercise Vital Sign    Days of Exercise per Week: 3 days    Minutes of Exercise per Session: 150+ min  Stress: No Stress Concern Present (10/16/2023)   [employer] of Occupational Health - Occupational Stress Questionnaire    Feeling of Stress : Not at all  Social Connections: Socially Integrated (10/16/2023)   Social Connection and Isolation Panel [NHANES]     Frequency of Communication with Friends and Family: More than three times a week    Frequency of Social Gatherings with Friends and Family: More than three times a week    Attends Religious Services: More than 4 times per year    Active Member of [employer] or Organizations: Yes    Attends Engineer, structural: More than 4 times per year    Marital Status: Married    Tobacco Counseling Counseling given: Not Answered   Clinical Intake:  Pre-visit preparation completed: Yes  Pain : No/denies pain     BMI - recorded: 29.38 Nutritional Status: BMI 25 -29 Overweight Nutritional Risks: None Diabetes:  No  How often do you need to have someone help you when you read instructions, pamphlets, or other written materials from your doctor or pharmacy?: 1 - Never  Interpreter Needed?: No  Information entered by :: Theresa Mulligan LPN   Activities of Daily Living    10/16/2023    1:56 PM  In your present state of health, do you have any difficulty performing the following activities:  Hearing? 0  Vision? 0  Difficulty concentrating or making decisions? 0  Walking or climbing stairs? 0  Dressing or bathing? 0  Doing errands, shopping? 0  Preparing Food and eating ? N  Using the Toilet? N  In the past six months, have you accidently leaked urine? N  Do you have problems with loss of bowel control? N  Managing your Medications? N  Managing your Finances? N  Housekeeping or managing your Housekeeping? N    Patient Care Team: [place], Betty G, MD as PCP - General (Family Medicine) Verner Chol, [facility] (Inactive) as Pharmacist (Pharmacist)  Indicate any recent Medical Services you may have received from other than [facility] providers in the past year (date may be approximate).     Assessment:   This is a routine wellness examination for [employer].  Hearing/Vision screen Hearing Screening - Comments:: Denies hearing difficulties   Vision Screening - Comments:: Wears rx glasses - up  to date with routine eye exams with  Dr Nedra Hai   Goals Addressed               This Visit's Progress     Remain Active (pt-stated)         Depression Screen    10/16/2023    1:55 PM 01/02/2023   10:32 AM 10/12/2022   11:09 AM 10/03/2022    2:05 PM 04/22/2022    2:03 PM 12/31/2021   11:26 AM 10/04/2021    2:39 PM  PHQ 2/9 Scores  PHQ - 2 Score 0 0 0 0 0 0 0  PHQ- 9 Score  0 0 0 0 0     Fall Risk    10/16/2023    1:56 PM 03/27/2023    1:08 PM 10/12/2022   11:16 AM 10/03/2022    2:04 PM 10/04/2021    2:42 PM  Fall Risk   Falls in the past year? 0 1 1 1  0  Comment   04/2022    Number falls in past yr: 0 0 1 1 0  Injury with Fall? 0 1 1 1  0  Risk for fall due to : No Fall Risks No Fall Risks History of fall(s);Impaired balance/gait;Orthopedic patient History of fall(s)   Follow up Falls prevention discussed Falls evaluation completed Education provided;Falls prevention discussed Falls evaluation completed     MEDICARE RISK AT HOME: Medicare Risk at Home Any stairs in or around the home?: Yes If so, are there any without handrails?: No Home free of loose throw rugs in walkways, pet beds, electrical cords, etc?: Yes Adequate lighting in your home to reduce risk of falls?: Yes Life alert?: No Use of a cane, walker or w/c?: No Grab bars in the bathroom?: No Shower chair or bench in shower?: Yes Elevated toilet seat or a handicapped toilet?: Yes  TIMED UP AND GO:  Was the test performed?  No    Cognitive Function:        10/16/2023    1:57 PM 10/12/2022   11:24 AM 10/04/2021    2:43 PM  6CIT Screen  What Year? 0 points 0 points 0 points  What month? 0 points 0 points 0 points  What time? 0 points 0 points 0 points  Count back from 20 0 points 0 points 0 points  Months in reverse 0 points 2 points 0 points  Repeat phrase 0 points 0 points 0 points  Total Score 0 points 2 points 0 points    Immunizations Immunization History  Administered Date(s) Administered   Fluad  Quad(high Dose 65+) 06/29/2021   Influenza-Unspecified 05/22/2019, 06/10/2020, 06/27/2022, 06/06/2023   Moderna Covid-19 Fall Seasonal Vaccine 5yrs & older 03/15/2023   PFIZER Comirnaty(Gray Top)Covid-19 Tri-Sucrose Vaccine 05/08/2021   PFIZER(Purple Top)SARS-COV-2 Vaccination 11/17/2019, 12/08/2019, 06/10/2020   PNEUMOCOCCAL CONJUGATE-20 03/15/2023   Pfizer Covid-19 Vaccine Bivalent Booster 6yrs & up 08/19/2021   Pneumococcal Conjugate-13 06/07/2019   Pneumococcal Polysaccharide-23 08/18/2020   Respiratory Syncytial Virus Vaccine,Recomb Aduvanted(Arexvy) 06/18/2022   Tdap 06/07/2019   Zoster Recombinant(Shingrix) 12/23/2020, 02/27/2021    TDAP status: Up to date  Flu Vaccine status: Up to date  Pneumococcal vaccine status: Up to date  Covid-19 vaccine status: Declined, Education has been provided regarding the importance of this vaccine but patient still declined. Advised may receive this vaccine at local pharmacy or Health Dept.or vaccine clinic. Aware to provide a copy of the vaccination record if obtained from local pharmacy or Health Dept. Verbalized acceptance and understanding.  Qualifies for Shingles Vaccine? Yes   Zostavax completed Yes   Shingrix Completed?: Yes  Screening Tests Health Maintenance  Topic Date Due   COVID-19 Vaccine (7 - 2024-25 season) 05/14/2023   Colonoscopy  12/18/2023   MAMMOGRAM  10/09/2024   Medicare Annual Wellness (AWV)  10/15/2024   DTaP/Tdap/Td (2 - Td or Tdap) 06/06/2029   Pneumonia Vaccine 26+ Years old  Completed   INFLUENZA VACCINE  Completed   DEXA SCAN  Completed   Hepatitis C Screening  Completed   Zoster Vaccines- Shingrix  Completed   HPV VACCINES  Aged Out    Health Maintenance  Health Maintenance Due  Topic Date Due   COVID-19 Vaccine (7 - 2024-25 season) 05/14/2023   Colonoscopy  12/18/2023    Colorectal cancer screening: Referral to GI placed Scheduled for 12/21/23. Pt aware the office will call re:  appt.  Mammogram status: Completed 10/10/23. Repeat every year  Bone Density status: Completed 10/05/22. Results reflect: Bone density results: OSTEOPENIA. Repeat every   years.     Additional Screening:  Hepatitis C Screening: does qualify; Completed 02/17/20  Vision Screening: Recommended annual ophthalmology exams for early detection of glaucoma and other disorders of the eye. Is the patient up to date with their annual eye exam?  Yes  Who is the provider or what is the name of the office in which the patient attends annual eye exams? Dr Nedra Hai If pt is not established with a provider, would they like to be referred to a provider to establish care? No .   Dental Screening: Recommended annual dental exams for proper oral hygiene    Community Resource Referral / Chronic Care Management:  CRR required this visit?  No   CCM required this visit?  No     Plan:     I have personally reviewed and noted the following in the patient's chart:   Medical and social history Use of alcohol, tobacco or illicit drugs  Current medications and supplements including opioid prescriptions. Patient is not currently taking opioid prescriptions. Functional ability and status Nutritional status Physical activity Advanced directives List  of other physicians Hospitalizations, surgeries, and ER visits in previous 12 months Vitals Screenings to include cognitive, depression, and falls Referrals and appointments  In addition, I have reviewed and discussed with patient certain preventive protocols, quality metrics, and best practice recommendations. A written personalized care plan for preventive services as well as general preventive health recommendations were provided to patient.     Tillie Rung, LPN   05/17/6212   After Visit Summary: (MyChart) Due to this being a telephonic visit, the after visit summary with patients personalized plan was offered to patient via MyChart   Nurse Notes:  None

## 2023-10-16 NOTE — Patient Instructions (Addendum)
Ms. Shropshire , Thank you for taking time to come for your Medicare Wellness Visit. I appreciate your ongoing commitment to your health goals. Please review the following plan we discussed and let me know if I can assist you in the future.   Referrals/Orders/Follow-Ups/Clinician Recommendations:   This is a list of the screening recommended for you and due dates:  Health Maintenance  Topic Date Due   COVID-19 Vaccine (7 - 2024-25 season) 05/14/2023   Colon Cancer Screening  12/18/2023   Mammogram  10/09/2024   Medicare Annual Wellness Visit  10/15/2024   DTaP/Tdap/Td vaccine (2 - Td or Tdap) 06/06/2029   Pneumonia Vaccine  Completed   Flu Shot  Completed   DEXA scan (bone density measurement)  Completed   Hepatitis C Screening  Completed   Zoster (Shingles) Vaccine  Completed   HPV Vaccine  Aged Out    Advanced directives: (Copy Requested) Please bring a copy of your health care power of attorney and living will to the office to be added to your chart at your convenience.  Next Medicare Annual Wellness Visit scheduled for next year: Yes

## 2023-10-23 ENCOUNTER — Other Ambulatory Visit: Payer: Self-pay | Admitting: Rheumatology

## 2023-10-24 NOTE — Telephone Encounter (Signed)
Last Fill: 08/29/2023  Eye exam: 12/27/2022   Labs: 07/13/2023 CBC and CMP normal.   Next Visit: 12/13/2023  Last Visit: 07/13/2023  XB:[id number] arthritis of multiple sites with negative rheumatoid factor   Current Dose per office note 07/13/2023: Plaquenil 200 mg 1 tablet p.o. twice daily.   Okay to refill Plaquenil?

## 2023-11-16 ENCOUNTER — Other Ambulatory Visit: Payer: Self-pay | Admitting: Family Medicine

## 2023-11-29 NOTE — Progress Notes (Signed)
 Office Visit Note  Patient: Mercedes Hodge             Date of Birth: 1956-03-15           MRN: [id number]             PCP: [place], Betty G, MD Referring: [place], Betty G, MD Visit Date: 12/13/2023 Occupation: @GUAROCC @  Subjective:    History of Present Illness: Mercedes Hodge is a 68 y.o. female with seropositive rheumatoid arthritis, osteoarthritis and osteopenia.  She denies any increased joint pain or joint swelling.  She has not had any flares of rheumatoid arthritis.  She continues to [facility] [employer] [facility] [employer]  She has been on hydroxychloroquine 200 mg twice a day without any interruption.    Activities of Daily Living:  Patient reports morning stiffness for 15 minutes.   Patient Reports nocturnal pain.  Difficulty dressing/grooming: Denies Difficulty climbing stairs: Denies Difficulty getting out of chair: Denies Difficulty using hands for taps, buttons, cutlery, and/or writing: Denies  Review of Systems  Constitutional:  Negative for fatigue.  HENT:  Negative for mouth sores and mouth dryness.   Eyes:  Negative for dryness.  Respiratory:  Negative for shortness of breath and difficulty breathing.   Cardiovascular:  Negative for chest pain and palpitations.  Gastrointestinal:  Positive for constipation. Negative for blood in stool and diarrhea.  Endocrine: Negative for increased urination.  Genitourinary:  Negative for involuntary urination.  Musculoskeletal:  Positive for joint pain, joint pain, joint swelling and morning stiffness. Negative for gait problem, myalgias, muscle weakness, muscle tenderness and myalgias.  Skin:  Negative for color change, rash, hair loss and sensitivity to sunlight.  Allergic/Immunologic: Negative for susceptible to infections.  Neurological:  Negative for dizziness and headaches.  Hematological:  Negative for swollen glands.  Psychiatric/Behavioral:  Negative for depressed mood and sleep disturbance. The patient is  not nervous/anxious.     PMFS History:  Patient Active Problem List   Diagnosis Date Noted   Routine general medical examination at a health care facility 01/02/2023   Ganglion cyst of flexor tendon sheath 03/04/2022   Depression, major, in remission (HCC) 06/29/2021   Aortic atherosclerosis (HCC) 08/18/2020   History of colonic polyps 07/20/2020   Atrophic vaginitis 02/17/2020   Rheumatoid arthritis of multiple sites with negative rheumatoid factor (HCC) 07/02/2019   High risk medication use 07/02/2019   Primary osteoarthritis of both hands 07/02/2019   Status post total bilateral knee replacement 07/02/2019   Status post right ankle joint replacement 07/02/2019   Essential hypertension 07/02/2019   Asthma, intermittent, uncomplicated 07/02/2019   History of thyroid cancer 07/02/2019   Postoperative hypothyroidism 07/02/2019   Dyslipidemia 07/02/2019   Gastroesophageal reflux disease without esophagitis 07/02/2019   Osteopenia of multiple sites 07/02/2019    Past Medical History:  Diagnosis Date   Allergy    Arthritis    Asthma    GERD (gastroesophageal reflux disease)    Hyperlipidemia    Hypertension    Thyroid cancer (HCC)    thyroid   Thyroid disease    Postsurgical hypothyroidism    Family History  Problem Relation Age of Onset   COPD Mother    Heart disease Mother    Cancer Father        thyroid cancer   Juvenile Rhematoid Arthritis Sister    Stroke Sister    Thyroid cancer Brother    Thyroid cancer Daughter    Healthy Daughter  Healthy Daughter    Healthy Son    Colon cancer Neg Hx    Esophageal cancer Neg Hx    Pancreatic cancer Neg Hx    Stomach cancer Neg Hx    Liver disease Neg Hx    Rectal cancer Neg Hx    Past Surgical History:  Procedure Laterality Date   ABDOMINAL HYSTERECTOMY  1993   CHOLECYSTECTOMY  1982   EXTRACORPOREAL SHOCK WAVE LITHOTRIPSY Left 09/17/2020   Procedure: EXTRACORPOREAL SHOCK WAVE LITHOTRIPSY (ESWL);  Surgeon:  Marcine Matar, MD;  Location: [facility];  Service: Urology;  Laterality: Left;   LAPAROSCOPIC GASTRIC SLEEVE RESECTION  07/2018   left knee replacement  2008   ORIF FEMUR FRACTURE  04/13/2022   partial knee revision  04/13/2022   right ankle replacement  2012   right knee replacement  2009   trimallar FX  1995   right ankle   Social History   Social History Narrative   Not on file   Immunization History  Administered Date(s) Administered   Fluad Quad(high Dose 65+) 06/29/2021   Influenza-Unspecified 05/22/2019, 06/10/2020, 06/27/2022, 06/06/2023   Moderna Covid-19 Fall Seasonal Vaccine 84yrs & older 03/15/2023   PFIZER Comirnaty(Gray Top)Covid-19 Tri-Sucrose Vaccine 05/08/2021   PFIZER(Purple Top)SARS-COV-2 Vaccination 11/17/2019, 12/08/2019, 06/10/2020   PNEUMOCOCCAL CONJUGATE-20 03/15/2023   Pfizer Covid-19 Vaccine Bivalent Booster 24yrs & up 08/19/2021   Pneumococcal Conjugate-13 06/07/2019   Pneumococcal Polysaccharide-23 08/18/2020   Respiratory Syncytial Virus Vaccine,Recomb Aduvanted(Arexvy) 06/18/2022   Tdap 06/07/2019   Zoster Recombinant(Shingrix) 12/23/2020, 02/27/2021     Objective: Vital Signs: BP 119/69 (BP Location: Left Arm, Patient Position: Sitting, Cuff Size: Normal)   Pulse 64   Resp 15   Ht 5\' 6"  (1.676 m)   Wt 189 lb 9.6 oz (86 kg)   BMI 30.60 kg/m    Physical Exam Vitals and nursing note reviewed.  Constitutional:      Appearance: She is well-developed.  HENT:     Head: Normocephalic and atraumatic.  Eyes:     Conjunctiva/sclera: Conjunctivae normal.  Cardiovascular:     Rate and Rhythm: Normal rate and regular rhythm.     Heart sounds: Normal heart sounds.  Pulmonary:     Effort: Pulmonary effort is normal.     Breath sounds: Normal breath sounds.  Abdominal:     General: Bowel sounds are normal.     Palpations: Abdomen is soft.  Musculoskeletal:     Cervical back: Normal range of motion.  Lymphadenopathy:      Cervical: No cervical adenopathy.  Skin:    General: Skin is warm and dry.     Capillary Refill: Capillary refill takes less than 2 seconds.  Neurological:     Mental Status: She is alert and oriented to person, place, and time.  Psychiatric:        Behavior: Behavior normal.      Musculoskeletal Exam: Cervical spine, thoracic and lumbar spine 1 good range of motion without any tenderness.  Shoulders, elbows, wrists, MCPs PIPs and DIPs with good range of motion.  She had bilateral CMC thickening, PIP and DIP thickening with no synovitis.  Hip joints and knee joints in good range of motion.  Bilateral knee joints were replaced with some warmth on palpation of her right knee joint.  Right ankle was replaced with limited range of motion and thickening of the right ankle.  There was no tenderness over ankles or MTPs.  CDAI Exam: CDAI Score: -- Patient Global: 0 /  100; Provider Global: 0 / 100 Swollen: --; Tender: -- Joint Exam 12/13/2023   No joint exam has been documented for this visit   There is currently no information documented on the homunculus. Go to the Rheumatology activity and complete the homunculus joint exam.  Investigation: No additional findings.  Imaging: No results found.  Recent Labs: Lab Results  Component Value Date   WBC 6.3 07/13/2023   HGB 13.3 07/13/2023   PLT 193 07/13/2023   NA 137 07/13/2023   K 4.2 07/13/2023   CL 103 07/13/2023   CO2 26 07/13/2023   GLUCOSE 96 07/13/2023   BUN 26 (H) 07/13/2023   CREATININE 0.92 07/13/2023   BILITOT 0.5 07/13/2023   AST 28 07/13/2023   ALT 18 07/13/2023   PROT 6.9 07/13/2023   CALCIUM 9.2 07/13/2023   GFRAA 93 11/27/2020     Speciality Comments: Plaquenil eye exam: 12/27/2022  [facility]  Follow up in 1 year. --[facility] [phone] 12/13/2023 and left message to obtain updated eye exam--  DEXA at [facility] through PCP  Procedures:  No procedures  performed Allergies: Lovenox [enoxaparin sodium]   Assessment / Plan:     Visit Diagnoses: Rheumatoid arthritis of multiple sites with negative rheumatoid factor (HCC) + anti-CCP-patient had no synovitis on the examination.  She denies having a rheumatoid arthritis flare.  She continues to be on hydroxychloroquine on a regular basis.  High risk medication use - Plaquenil 200 mg 1 tablet p.o. twice daily. Plaquenil eye exam: 12/27/2022.  Patient states that she had eye examination December 2024.-Labs from October 2024 CBC and CMP were normal which were reviewed.  Plan: CBC with Differential/Platelet, Comprehensive metabolic panel with GFR today.  Information minimization was placed in the AVS.  Primary osteoarthritis of both hands-she had bilateral PIP and DIP thickening.  CMC arthritis was noted.  Patient has CMC braces.  Use of topical Voltaren gel was discussed.  Status post closed fracture of left femur - she fell in February 2023 while she was in [place].  She had open reduction internal fixation of the right femur. followed by Dr. Linna Caprice.  Trochanteric bursitis of both hips-resolved.  IT band stretches were discussed.  Status post total bilateral knee replacement - Right total knee replacement in 2009 and left total knee replacement in 2008.  Status post right ankle joint replacement - Due to Charcot joint.  Osteopenia of multiple sites - October 05, 2022 T-score -1.6 lumbar spine, right femoral neck -1.5.  DEXA results were discussed with the patient.  Use of calcium rich diet and vitamin D was discussed.  Vitamin D deficiency-vitamin D was normal at 46 on Jan 31, 2023.  Other medical problems listed as follows:  Essential hypertension  History of asthma  Dyslipidemia  Gastroesophageal reflux disease without esophagitis  History of thyroid cancer  Orders: Orders Placed This Encounter  Procedures   CBC with Differential/Platelet   Comprehensive metabolic panel with  GFR   No orders of the defined types were placed in this encounter.    Follow-Up Instructions: Return in about 5 months (around 05/14/2024) for Rheumatoid arthritis, Osteoarthritis.   Pollyann Savoy, MD  Note - This record has been created using Animal nutritionist.  Chart creation errors have been sought, but may not always  have been located. Such creation errors do not reflect on  the standard of medical care.

## 2023-12-07 ENCOUNTER — Ambulatory Visit (AMBULATORY_SURGERY_CENTER): Payer: Medicare Other

## 2023-12-07 VITALS — Ht 66.0 in | Wt 182.0 lb

## 2023-12-07 DIAGNOSIS — Z860100 Personal history of colon polyps, unspecified: Secondary | ICD-10-CM

## 2023-12-07 MED ORDER — SUFLAVE 178.7 G PO SOLR: 1.0000 | Freq: Once | ORAL | 0 refills | Status: AC

## 2023-12-07 NOTE — Progress Notes (Signed)
 No egg or soy allergy known to patient  No issues known to pt with past sedation with any surgeries or procedures Patient denies ever being told they had issues or difficulty with intubation  No FH of Malignant Hyperthermia Pt is not on diet pills Pt is not on  home 02  Pt is not on blood thinners  Pt has issues with constipation, takes fiber daily and colace as needed.  Extra Miralax for prep instructions provided. No A fib or A flutter Have any cardiac testing pending--No Pt can ambulate  Pt denies use of chewing tobacco Discussed diabetic I weight loss medication holds Discussed NSAID holds Checked BMI Pt instructed to use Singlecare.com or GoodRx for a price reduction on prep  Patient's chart reviewed by Cathlyn Parsons CNRA prior to previsit and patient appropriate for the [facility].  Pre visit completed and red dot placed by patient's name on their procedure day (on provider's schedule).

## 2023-12-13 ENCOUNTER — Encounter: Payer: Self-pay | Admitting: Rheumatology

## 2023-12-13 ENCOUNTER — Ambulatory Visit: Payer: Medicare Other | Attending: Rheumatology | Admitting: Rheumatology

## 2023-12-13 VITALS — BP 119/69 | HR 64 | Resp 15 | Ht 66.0 in | Wt 189.6 lb

## 2023-12-13 DIAGNOSIS — M19041 Primary osteoarthritis, right hand: Secondary | ICD-10-CM | POA: Diagnosis not present

## 2023-12-13 DIAGNOSIS — I10 Essential (primary) hypertension: Secondary | ICD-10-CM | POA: Diagnosis not present

## 2023-12-13 DIAGNOSIS — K219 Gastro-esophageal reflux disease without esophagitis: Secondary | ICD-10-CM

## 2023-12-13 DIAGNOSIS — Z8709 Personal history of other diseases of the respiratory system: Secondary | ICD-10-CM

## 2023-12-13 DIAGNOSIS — Z79899 Other long term (current) drug therapy: Secondary | ICD-10-CM

## 2023-12-13 DIAGNOSIS — Z8781 Personal history of (healed) traumatic fracture: Secondary | ICD-10-CM

## 2023-12-13 DIAGNOSIS — M0609 Rheumatoid arthritis without rheumatoid factor, multiple sites: Secondary | ICD-10-CM

## 2023-12-13 DIAGNOSIS — M7061 Trochanteric bursitis, right hip: Secondary | ICD-10-CM

## 2023-12-13 DIAGNOSIS — Z85850 Personal history of malignant neoplasm of thyroid: Secondary | ICD-10-CM

## 2023-12-13 DIAGNOSIS — E559 Vitamin D deficiency, unspecified: Secondary | ICD-10-CM | POA: Diagnosis not present

## 2023-12-13 DIAGNOSIS — E785 Hyperlipidemia, unspecified: Secondary | ICD-10-CM

## 2023-12-13 DIAGNOSIS — Z96661 Presence of right artificial ankle joint: Secondary | ICD-10-CM

## 2023-12-13 DIAGNOSIS — Z96653 Presence of artificial knee joint, bilateral: Secondary | ICD-10-CM | POA: Diagnosis not present

## 2023-12-13 DIAGNOSIS — M8589 Other specified disorders of bone density and structure, multiple sites: Secondary | ICD-10-CM | POA: Diagnosis not present

## 2023-12-13 DIAGNOSIS — M19042 Primary osteoarthritis, left hand: Secondary | ICD-10-CM

## 2023-12-13 DIAGNOSIS — M65331 Trigger finger, right middle finger: Secondary | ICD-10-CM

## 2023-12-13 NOTE — Patient Instructions (Signed)

## 2023-12-14 LAB — COMPREHENSIVE METABOLIC PANEL WITH GFR
AG Ratio: 1.9 (calc) (ref 1.0–2.5)
ALT: 18 U/L (ref 6–29)
AST: 26 U/L (ref 10–35)
Albumin: 4.5 g/dL (ref 3.6–5.1)
Alkaline phosphatase (APISO): 53 U/L (ref 37–153)
BUN/Creatinine Ratio: 30 (calc) — ABNORMAL HIGH (ref 6–22)
BUN: 26 mg/dL — ABNORMAL HIGH (ref 7–25)
CO2: 26 mmol/L (ref 20–32)
Calcium: 9.2 mg/dL (ref 8.6–10.4)
Chloride: 103 mmol/L (ref 98–110)
Creat: 0.87 mg/dL (ref 0.60–1.00)
Globulin: 2.4 g/dL (ref 1.9–3.7)
Glucose, Bld: 88 mg/dL (ref 65–99)
Potassium: 4.6 mmol/L (ref 3.5–5.3)
Sodium: 137 mmol/L (ref 135–146)
Total Bilirubin: 0.6 mg/dL (ref 0.2–1.2)
Total Protein: 6.9 g/dL (ref 6.1–8.1)
eGFR: 72 mL/min/{1.73_m2} (ref >=60)

## 2023-12-14 LAB — CBC WITH DIFFERENTIAL/PLATELET
Absolute Lymphocytes: 1670 {cells}/uL (ref 850–3900)
Absolute Monocytes: 725 {cells}/uL (ref 200–950)
Basophils Absolute: 62 {cells}/uL (ref 0–200)
Basophils Relative: 0.9 %
Eosinophils Absolute: 193 {cells}/uL (ref 15–500)
Eosinophils Relative: 2.8 %
HCT: 40.3 % (ref 35.0–45.0)
Hemoglobin: 13.6 g/dL (ref 11.7–15.5)
MCH: 33.5 pg — ABNORMAL HIGH (ref 27.0–33.0)
MCHC: 33.7 g/dL (ref 32.0–36.0)
MCV: 99.3 fL (ref 80.0–100.0)
MPV: 10.1 fL (ref 7.5–12.5)
Monocytes Relative: 10.5 %
Neutro Abs: 4250 {cells}/uL (ref 1500–7800)
Neutrophils Relative %: 61.6 %
Platelets: 188 10*3/uL (ref 140–400)
RBC: 4.06 10*6/uL (ref 3.80–5.10)
RDW: 12.2 % (ref 11.0–15.0)
Total Lymphocyte: 24.2 %
WBC: 6.9 10*3/uL (ref 3.8–10.8)

## 2023-12-14 NOTE — Progress Notes (Signed)
 CBC and CMP are stable.

## 2023-12-19 ENCOUNTER — Encounter: Payer: Self-pay | Admitting: Gastroenterology

## 2023-12-19 ENCOUNTER — Encounter: Payer: Self-pay | Admitting: Certified Registered Nurse Anesthetist

## 2023-12-21 ENCOUNTER — Encounter: Payer: Self-pay | Admitting: Gastroenterology

## 2023-12-21 ENCOUNTER — Ambulatory Visit (AMBULATORY_SURGERY_CENTER): Payer: Medicare Other | Admitting: Gastroenterology

## 2023-12-21 VITALS — BP 103/77 | HR 61 | Temp 97.6°F | Resp 13 | Ht 66.0 in | Wt 182.0 lb

## 2023-12-21 DIAGNOSIS — Z1211 Encounter for screening for malignant neoplasm of colon: Secondary | ICD-10-CM

## 2023-12-21 DIAGNOSIS — K5730 Diverticulosis of large intestine without perforation or abscess without bleeding: Secondary | ICD-10-CM

## 2023-12-21 DIAGNOSIS — D122 Benign neoplasm of ascending colon: Secondary | ICD-10-CM

## 2023-12-21 DIAGNOSIS — Z860101 Personal history of adenomatous and serrated colon polyps: Secondary | ICD-10-CM

## 2023-12-21 DIAGNOSIS — E785 Hyperlipidemia, unspecified: Secondary | ICD-10-CM | POA: Diagnosis not present

## 2023-12-21 DIAGNOSIS — D123 Benign neoplasm of transverse colon: Secondary | ICD-10-CM | POA: Diagnosis not present

## 2023-12-21 DIAGNOSIS — K635 Polyp of colon: Secondary | ICD-10-CM

## 2023-12-21 DIAGNOSIS — Z860100 Personal history of colon polyps, unspecified: Secondary | ICD-10-CM

## 2023-12-21 DIAGNOSIS — K648 Other hemorrhoids: Secondary | ICD-10-CM | POA: Diagnosis not present

## 2023-12-21 MED ORDER — SODIUM CHLORIDE 0.9 % IV SOLN: 500.0000 mL | Freq: Once | INTRAVENOUS | Status: DC

## 2023-12-21 MED ORDER — HYDROCORTISONE ACETATE 25 MG RE SUPP: 25.0000 mg | Freq: Every evening | RECTAL | 1 refills | Status: DC | PRN

## 2023-12-21 NOTE — Op Note (Signed)
 [facility] Patient Name: Mercedes Hodge Procedure Date: 12/21/2023 10:06 AM MRN: [id number] Endoscopist: Napoleon Form , MD, [id number] Age: 68 Referring MD:  Date of Birth: 10-20-1955 Gender: Female Account #: [id number] Procedure:                Colonoscopy Indications:              High risk colon cancer surveillance: Personal                            history of adenoma (10 mm or greater in size) Medicines:                Monitored Anesthesia Care Procedure:                Pre-Anesthesia Assessment:                           - Prior to the procedure, a History and Physical                            was performed, and patient medications and                            allergies were reviewed. The patient's tolerance of                            previous anesthesia was also reviewed. The risks                            and benefits of the procedure and the sedation                            options and risks were discussed with the patient.                            All questions were answered, and informed consent                            was obtained. Prior Anticoagulants: The patient has                            taken no anticoagulant or antiplatelet agents. ASA                            Grade Assessment: II - A patient with mild systemic                            disease. After reviewing the risks and benefits,                            the patient was deemed in satisfactory condition to                            undergo the procedure.  After obtaining informed consent, the colonoscope                            was passed under direct vision. Throughout the                            procedure, the patient's blood pressure, pulse, and                            oxygen saturations were monitored continuously. The                            Olympus Scope SN: [id number] was introduced through                            the anus  and advanced to the the cecum, identified                            by the appendiceal orifice. The colonoscopy was                            performed without difficulty. The patient tolerated                            the procedure well. The quality of the bowel                            preparation was good. The ileocecal valve,                            appendiceal orifice, and rectum were photographed. Scope In: 10:30:39 AM Scope Out: 10:52:52 AM Scope Withdrawal Time: 0 hours 12 minutes 15 seconds  Total Procedure Duration: 0 hours 22 minutes 13 seconds  Findings:                 The perianal and digital rectal examinations were                            normal.                           Four sessile polyps were found in the transverse                            colon and ascending colon. The polyps were 3 to 6                            mm in size. These polyps were removed with a cold                            snare. Resection and retrieval were complete.                           Scattered large-mouthed, medium-mouthed and  small-mouthed diverticula were found in the sigmoid                            colon, descending colon, transverse colon and                            ascending colon. There was narrowing of the colon                            in association with the diverticular opening. There                            was evidence of diverticular spasm. There was                            evidence of an impacted diverticulum.                           Non-bleeding internal hemorrhoids were found during                            retroflexion. The hemorrhoids were small. Complications:            No immediate complications. Estimated Blood Loss:     Estimated blood loss was minimal. Impression:               - Four 3 to 6 mm polyps in the transverse colon and                            in the ascending colon, removed with a cold snare.                             Resected and retrieved.                           - Severe diverticulosis in the sigmoid colon, in                            the descending colon, in the transverse colon and                            in the ascending colon. There was narrowing of the                            colon in association with the diverticular opening.                            There was evidence of diverticular spasm. There was                            evidence of an impacted diverticulum.                           - Non-bleeding internal hemorrhoids. Recommendation:           -  Patient has a contact number available for                            emergencies. The signs and symptoms of potential                            delayed complications were discussed with the                            patient. Return to normal activities tomorrow.                            Written discharge instructions were provided to the                            patient.                           - Resume previous diet.                           - Continue present medications.                           - Await pathology results.                           - Repeat colonoscopy in 3 years for surveillance                            based on pathology results.                           - Return to GI clinic at the next available                            appointment for hemorrhoid band ligation. Napoleon Form, MD 12/21/2023 11:03:33 AM This report has been signed electronically.

## 2023-12-21 NOTE — Patient Instructions (Addendum)
 Resume previous diet and medications.  Handouts provided on polyps and hemorrhoids.  Education attached on diverticulosis.  Repeat colonoscopy in 3 years.  Appointment scheduled for hemorrhoid banding in July - if you need to change the appointment, please contact the office directly.  Prescription sent for hydrocortisone suppository to be used at bedtime as needed.    YOU HAD AN ENDOSCOPIC PROCEDURE TODAY AT THE [facility]:   Refer to the procedure report that was given to you for any specific questions about what was found during the examination.  If the procedure report does not answer your questions, please call your gastroenterologist to clarify.  If you requested that your care partner not be given the details of your procedure findings, then the procedure report has been included in a sealed envelope for you to review at your convenience later.  YOU SHOULD EXPECT: Some feelings of bloating in the abdomen. Passage of more gas than usual.  Walking can help get rid of the air that was put into your GI tract during the procedure and reduce the bloating. If you had a lower endoscopy (such as a colonoscopy or flexible sigmoidoscopy) you may notice spotting of blood in your stool or on the toilet paper. If you underwent a bowel prep for your procedure, you may not have a normal bowel movement for a few days.  Please Note:  You might notice some irritation and congestion in your nose or some drainage.  This is from the oxygen used during your procedure.  There is no need for concern and it should clear up in a day or so.  SYMPTOMS TO REPORT IMMEDIATELY:  Following lower endoscopy (colonoscopy or flexible sigmoidoscopy):  Excessive amounts of blood in the stool  Significant tenderness or worsening of abdominal pains  Swelling of the abdomen that is new, acute  Fever of 100F or higher  For urgent or emergent issues, a gastroenterologist can be reached at any hour by calling [phone]  [phone]. Do not use MyChart messaging for urgent concerns.    DIET:  We do recommend a small meal at first, but then you may proceed to your regular diet.  Drink plenty of fluids but you should avoid alcoholic beverages for 24 hours.  ACTIVITY:  You should plan to take it easy for the rest of today and you should NOT DRIVE or use heavy machinery until tomorrow (because of the sedation medicines used during the test).    FOLLOW UP: Our staff will call the number listed on your records the next business day following your procedure.  We will call around 7:15- 8:00 am to check on you and address any questions or concerns that you may have regarding the information given to you following your procedure. If we do not reach you, we will leave a message.     If any biopsies were taken you will be contacted by phone or by letter within the next 1-3 weeks.  Please call us at [phone] if you have not heard about the biopsies in 3 weeks.    SIGNATURES/CONFIDENTIALITY: You and/or your care partner have signed paperwork which will be entered into your electronic medical record.  These signatures attest to the fact that that the information above on your After Visit Summary has been reviewed and is understood.  Full responsibility of the confidentiality of this discharge information lies with you and/or your care-partner.

## 2023-12-21 NOTE — Progress Notes (Signed)
 Report given to PACU, vss

## 2023-12-21 NOTE — Progress Notes (Signed)
 Called to room to assist during endoscopic procedure.  Patient ID and intended procedure confirmed with present staff. Received instructions for my participation in the procedure from the performing physician.

## 2023-12-21 NOTE — Progress Notes (Signed)
 [facility] History and Physical   Primary Care Physician:  [place], Betty G, MD   Reason for Procedure:  History of adenomatous colon polyps  Plan:    Surveillance colonoscopy with possible interventions as needed     HPI: Mercedes Hodge is a very pleasant 68 y.o. female here for surveillance colonoscopy. Denies any nausea, vomiting, abdominal pain, melena or bright red blood per rectum  The risks and benefits as well as alternatives of endoscopic procedure(s) have been discussed and reviewed. All questions answered. The patient agrees to proceed.    Past Medical History:  Diagnosis Date   Allergy    Arthritis    Asthma    GERD (gastroesophageal reflux disease)    Hyperlipidemia    Hypertension    Thyroid cancer (HCC)    thyroid   Thyroid disease    Postsurgical hypothyroidism    Past Surgical History:  Procedure Laterality Date   ABDOMINAL HYSTERECTOMY  1993   CHOLECYSTECTOMY  1982   EXTRACORPOREAL SHOCK WAVE LITHOTRIPSY Left 09/17/2020   Procedure: EXTRACORPOREAL SHOCK WAVE LITHOTRIPSY (ESWL);  Surgeon: Marcine Matar, MD;  Location: [facility];  Service: Urology;  Laterality: Left;   LAPAROSCOPIC GASTRIC SLEEVE RESECTION  07/2018   left knee replacement  2008   ORIF FEMUR FRACTURE  04/13/2022   partial knee revision  04/13/2022   right ankle replacement  2012   right knee replacement  2009   trimallar FX  1995   right ankle    Prior to Admission medications   Medication Sig Start Date End Date Taking? Authorizing Provider  amLODipine-benazepril (LOTREL) 5-40 MG capsule TAKE 1 CAPSULE BY MOUTH DAILY 09/15/23  Yes [place], Betty G, MD  calcium carbonate (OS-CAL) 600 MG tablet Take 600 mg by mouth daily.   Yes [provider]  Cholecalciferol (VITAMIN D) 50 MCG (2000 UT) tablet Take 2,000 Units by mouth daily.   Yes [provider]  diclofenac sodium (VOLTAREN) 1 % GEL Apply topically as needed.   Yes  [provider]  estradiol (ESTRACE) 0.1 MG/GM vaginal cream Place 1 Applicatorful vaginally once a week. Patient taking differently: Place 1 Applicatorful vaginally as needed. 10/03/22  Yes [place], Betty G, MD  fluticasone-salmeterol [facility] INHUB) 250-50 MCG/ACT AEPB Inhale 1 puff into the lungs in the morning and at bedtime. 10/02/23  Yes [place], Betty G, MD  hydroxychloroquine (PLAQUENIL) 200 MG tablet TAKE 1 TABLET BY MOUTH TWICE  DAILY 10/24/23  Yes Gearldine Bienenstock, PA-C  levothyroxine (SYNTHROID) 137 MCG tablet Take 1 tablet by mouth daily except take 1/2 tablet on Tuesdays & Thursdays. 05/26/23  Yes [place], Betty G, MD  pramoxine-hydrocortisone [facility]) cream Apply topically 2 (two) times daily as needed. For 7 days 06/07/23  Yes Napoleon Form, MD  pravastatin (PRAVACHOL) 40 MG tablet TAKE 1 TABLET BY MOUTH DAILY 11/17/23  Yes [place], Betty G, MD  amoxicillin (AMOXIL) 500 MG capsule Take 500 mg by mouth 2 (two) times daily. For dental procedures Patient not taking: Reported on 12/21/2023 10/18/23   [provider]  fluticasone (FLONASE) 50 MCG/ACT nasal spray Place into both nostrils as needed for allergies or rhinitis.    [provider]    Current Outpatient Medications  Medication Sig Dispense Refill   amLODipine-benazepril (LOTREL) 5-40 MG capsule TAKE 1 CAPSULE BY MOUTH DAILY 100 capsule 2   calcium carbonate (OS-CAL) 600 MG tablet Take 600 mg by mouth daily.     Cholecalciferol (VITAMIN D) 50 MCG (  2000 UT) tablet Take 2,000 Units by mouth daily.     diclofenac sodium (VOLTAREN) 1 % GEL Apply topically as needed.     estradiol (ESTRACE) 0.1 MG/GM vaginal cream Place 1 Applicatorful vaginally once a week. (Patient taking differently: Place 1 Applicatorful vaginally as needed.) 42.5 g 3   fluticasone-salmeterol (WIXELA INHUB) 250-50 MCG/ACT AEPB Inhale 1 puff into the lungs in the morning and at bedtime. 180 each 2   hydroxychloroquine (PLAQUENIL) 200 MG  tablet TAKE 1 TABLET BY MOUTH TWICE  DAILY 180 tablet 0   levothyroxine (SYNTHROID) 137 MCG tablet Take 1 tablet by mouth daily except take 1/2 tablet on Tuesdays & Thursdays. 90 tablet 2   pramoxine-hydrocortisone (ANALPRAM HC) cream Apply topically 2 (two) times daily as needed. For 7 days 30 g 6   pravastatin (PRAVACHOL) 40 MG tablet TAKE 1 TABLET BY MOUTH DAILY 100 tablet 2   amoxicillin (AMOXIL) 500 MG capsule Take 500 mg by mouth 2 (two) times daily. For dental procedures (Patient not taking: Reported on 12/21/2023)     fluticasone (FLONASE) 50 MCG/ACT nasal spray Place into both nostrils as needed for allergies or rhinitis.     Current Facility-Administered Medications  Medication Dose Route Frequency Provider Last Rate Last Admin   0.9 %  sodium chloride infusion  500 mL Intravenous Once Napoleon Form, MD        Allergies as of 12/21/2023 - Review Complete 12/21/2023  Allergen Reaction Noted   Lovenox [enoxaparin sodium] Rash 06/07/2019    Family History  Problem Relation Age of Onset   COPD Mother    Heart disease Mother    Cancer Father        thyroid cancer   Juvenile Rhematoid Arthritis Sister    Stroke Sister    Thyroid cancer Brother    Thyroid cancer Daughter    Healthy Daughter    Healthy Daughter    Healthy Son    Colon cancer Neg Hx    Esophageal cancer Neg Hx    Pancreatic cancer Neg Hx    Stomach cancer Neg Hx    Liver disease Neg Hx    Rectal cancer Neg Hx     Social History   Socioeconomic History   Marital status: Married    Spouse name: Not on file   Number of children: Not on file   Years of education: Not on file   Highest education level: Not on file  Occupational History   Not on file  Tobacco Use   Smoking status: Never    Passive exposure: Past   Smokeless tobacco: Never  Vaping Use   Vaping status: Never Used  Substance and Sexual Activity   Alcohol use: Yes    Comment: occ wine    Drug use: Never   Sexual activity: Yes     Birth control/protection: None  Other Topics Concern   Not on file  Social History Narrative   Not on file   Social Drivers of Health   Financial Resource Strain: Low Risk  (10/16/2023)   Overall Financial Resource Strain (CARDIA)    Difficulty of Paying Living Expenses: Not hard at all  Food Insecurity: No Food Insecurity (10/16/2023)   Hunger Vital Sign    Worried About Running Out of Food in the Last Year: Never true    Ran Out of Food in the Last Year: Never true  Transportation Needs: No Transportation Needs (10/16/2023)   PRAPARE - Transportation    Lack of  Transportation (Medical): No    Lack of Transportation (Non-Medical): No  Physical Activity: Sufficiently Active (10/16/2023)   Exercise Vital Sign    Days of Exercise per Week: 3 days    Minutes of Exercise per Session: 150+ min  Stress: No Stress Concern Present (10/16/2023)   [employer] of Occupational Health - Occupational Stress Questionnaire    Feeling of Stress : Not at all  Social Connections: Socially Integrated (10/16/2023)   Social Connection and Isolation Panel [NHANES]    Frequency of Communication with Friends and Family: More than three times a week    Frequency of Social Gatherings with Friends and Family: More than three times a week    Attends Religious Services: More than 4 times per year    Active Member of [employer] or Organizations: Yes    Attends Engineer, structural: More than 4 times per year    Marital Status: Married  Catering manager Violence: Not At Risk (10/16/2023)   Humiliation, Afraid, Rape, and Kick questionnaire    Fear of Current or Ex-Partner: No    Emotionally Abused: No    Physically Abused: No    Sexually Abused: No    Review of Systems:  All other review of systems negative except as mentioned in the HPI.  Physical Exam: Vital signs in last 24 hours: BP 126/85   Pulse (!) 59   Temp 97.6 F (36.4 C) (Temporal)   Resp 19   Ht 5\' 6"  (1.676 m)   Wt 182 lb (82.6 kg)    SpO2 98%   BMI 29.38 kg/m  General:   Alert, NAD Lungs:  Clear .   Heart:  Regular rate and rhythm Abdomen:  Soft, nontender and nondistended. Neuro/Psych:  Alert and cooperative. Normal mood and affect. A and O x 3  Reviewed labs, radiology imaging, old records and pertinent past GI work up  Patient is appropriate for planned procedure(s) and anesthesia in an ambulatory setting   K. Scherry Ran , MD [phone]

## 2023-12-25 ENCOUNTER — Other Ambulatory Visit: Payer: Self-pay | Admitting: Physician Assistant

## 2023-12-25 LAB — SURGICAL PATHOLOGY

## 2023-12-26 ENCOUNTER — Telehealth: Payer: Self-pay

## 2023-12-26 NOTE — Telephone Encounter (Signed)
Attempted to reach patient for post-procedure f/u call. No answer. Left message for her to please not hesitate to call if she has any questions/concerns regarding her care. 

## 2024-01-23 DIAGNOSIS — H2511 Age-related nuclear cataract, right eye: Secondary | ICD-10-CM | POA: Diagnosis not present

## 2024-01-23 DIAGNOSIS — H25013 Cortical age-related cataract, bilateral: Secondary | ICD-10-CM | POA: Diagnosis not present

## 2024-01-23 DIAGNOSIS — H2513 Age-related nuclear cataract, bilateral: Secondary | ICD-10-CM | POA: Diagnosis not present

## 2024-01-23 DIAGNOSIS — I10 Essential (primary) hypertension: Secondary | ICD-10-CM | POA: Diagnosis not present

## 2024-01-23 DIAGNOSIS — H25043 Posterior subcapsular polar age-related cataract, bilateral: Secondary | ICD-10-CM | POA: Diagnosis not present

## 2024-01-25 ENCOUNTER — Other Ambulatory Visit: Payer: Self-pay | Admitting: Family Medicine

## 2024-01-25 ENCOUNTER — Ambulatory Visit: Payer: Self-pay | Admitting: Gastroenterology

## 2024-01-25 DIAGNOSIS — E890 Postprocedural hypothyroidism: Secondary | ICD-10-CM

## 2024-01-26 ENCOUNTER — Other Ambulatory Visit: Payer: Self-pay

## 2024-01-26 ENCOUNTER — Telehealth: Payer: Self-pay | Admitting: Gastroenterology

## 2024-01-26 MED ORDER — HYDROCORTISONE ACETATE 25 MG RE SUPP: 25.0000 mg | Freq: Two times a day (BID) | RECTAL | 1 refills | Status: DC

## 2024-01-26 NOTE — Telephone Encounter (Signed)
 Patient began with "my hemorrhoid bleeding" about 3 days ago. She used the cream. Reports it took 3 applications before the bleeding stopped. She passed gas yesterday and the bleeding started again. She repeated the application of the Analpram . This morning she had her normal bowel movement "soft not constipated" and this started her bleeding again. She is on her way to [place]  right now. She is asking if she needs to do something differently than the cream.

## 2024-01-26 NOTE — Telephone Encounter (Signed)
 Patient agrees to this plan of care. She will arrive at 9:40 am.

## 2024-01-26 NOTE — Telephone Encounter (Signed)
 Please send Rx for hydrocortisone  suppositories BID X 5 days, 12 with 1 refill. Please schedule hemorrhoid band ligation on Tuesday 5/20 AM as add on if she will be back in town by then

## 2024-01-26 NOTE — Telephone Encounter (Signed)
 Inbound call from patient, states she is having severe rectal bleeding, she states she is having to use menstrual pads to soak the bleeding because it stains her clothes, she would like to discuss relief and also if bending will help.

## 2024-01-30 ENCOUNTER — Ambulatory Visit: Admitting: Gastroenterology

## 2024-01-30 ENCOUNTER — Encounter: Payer: Self-pay | Admitting: Gastroenterology

## 2024-01-30 VITALS — BP 110/70 | HR 82 | Ht 66.0 in | Wt 188.0 lb

## 2024-01-30 DIAGNOSIS — K641 Second degree hemorrhoids: Secondary | ICD-10-CM | POA: Diagnosis not present

## 2024-01-30 DIAGNOSIS — K625 Hemorrhage of anus and rectum: Secondary | ICD-10-CM

## 2024-01-30 MED ORDER — HYDROCORTISONE ACETATE 25 MG RE SUPP: 25.0000 mg | Freq: Two times a day (BID) | RECTAL | 1 refills | Status: AC

## 2024-01-30 NOTE — Patient Instructions (Signed)

## 2024-01-30 NOTE — Progress Notes (Unsigned)
 PROCEDURE NOTE: The patient presents with symptomatic grade II  hemorrhoids, requesting rubber band ligation of his/her hemorrhoidal disease.  All risks, benefits and alternative forms of therapy were described and informed consent was obtained.  In the Left Lateral Decubitus position anoscopic examination revealed grade II hemorrhoids in the right posterior, left lateral and right anterior  position(s).  The anorectum was pre-medicated with 0.125% The decision was made to band the right posterior hemorrhoid  internal hemorrhoid, and the [facility] O'Regan System was used to perform band ligation without complication.  Digital anorectal examination was then performed to assure proper positioning of the band, and to adjust the banded tissue as required.  The patient was discharged home without pain or other issues.  Dietary and behavioral recommendations were given and along with follow-up instructions.      The patient will return in 3-4 weeks for  follow-up and possible additional banding as required. No complications were encountered and the patient tolerated the procedure well.  Lorena Rolling , MD [phone]

## 2024-02-22 ENCOUNTER — Encounter: Admitting: Gastroenterology

## 2024-03-27 ENCOUNTER — Encounter: Admitting: Gastroenterology

## 2024-04-22 DIAGNOSIS — Z961 Presence of intraocular lens: Secondary | ICD-10-CM | POA: Diagnosis not present

## 2024-04-22 DIAGNOSIS — H2511 Age-related nuclear cataract, right eye: Secondary | ICD-10-CM | POA: Diagnosis not present

## 2024-04-22 DIAGNOSIS — H2513 Age-related nuclear cataract, bilateral: Secondary | ICD-10-CM | POA: Diagnosis not present

## 2024-04-23 ENCOUNTER — Encounter: Admitting: Gastroenterology

## 2024-04-23 DIAGNOSIS — H2512 Age-related nuclear cataract, left eye: Secondary | ICD-10-CM | POA: Diagnosis not present

## 2024-04-30 NOTE — Progress Notes (Deleted)
 Office Visit Note  Patient: Mercedes Hodge             Date of Birth: Jan 12, 1956           MRN: [id number]             PCP: [place], Betty G, MD Referring: [place], Betty G, MD Visit Date: 05/14/2024 Occupation: @GUAROCC @  Subjective:    History of Present Illness: Yarrow Linhart is a 68 y.o. female with history of rheumatoid arthritis.  Patient is currently taking Plaquenil  200 mg 1 tablet p.o. twice daily.   CBC and CMP updated on 12/13/23. Orders for CBC and CMP released today.   Plaquenil  eye exam: 12/27/2022 [facility] Follow up in 1 year.   Activities of Daily Living:  Patient reports morning stiffness for *** {minute/hour:19697}.   Patient {ACTIONS;DENIES/REPORTS:21021675::Denies} nocturnal pain.  Difficulty dressing/grooming: {ACTIONS;DENIES/REPORTS:21021675::Denies} Difficulty climbing stairs: {ACTIONS;DENIES/REPORTS:21021675::Denies} Difficulty getting out of chair: {ACTIONS;DENIES/REPORTS:21021675::Denies} Difficulty using hands for taps, buttons, cutlery, and/or writing: {ACTIONS;DENIES/REPORTS:21021675::Denies}  No Rheumatology ROS completed.   PMFS History:  Patient Active Problem List   Diagnosis Date Noted   Routine general medical examination at a health care facility 01/02/2023   Ganglion cyst of flexor tendon sheath 03/04/2022   Depression, major, in remission (HCC) 06/29/2021   Aortic atherosclerosis (HCC) 08/18/2020   History of colonic polyps 07/20/2020   Atrophic vaginitis 02/17/2020   Rheumatoid arthritis of multiple sites with negative rheumatoid factor (HCC) 07/02/2019   High risk medication use 07/02/2019   Primary osteoarthritis of both hands 07/02/2019   Status post total bilateral knee replacement 07/02/2019   Status post right ankle joint replacement 07/02/2019   Essential hypertension 07/02/2019   Asthma, intermittent, uncomplicated 07/02/2019   History of thyroid  cancer 07/02/2019   Postoperative  hypothyroidism 07/02/2019   Dyslipidemia 07/02/2019   Gastroesophageal reflux disease without esophagitis 07/02/2019   Osteopenia of multiple sites 07/02/2019    Past Medical History:  Diagnosis Date   Allergy    Arthritis    Asthma    GERD (gastroesophageal reflux disease)    Hyperlipidemia    Hypertension    Thyroid  cancer (HCC)    thyroid    Thyroid  disease    Postsurgical hypothyroidism    Family History  Problem Relation Age of Onset   COPD Mother    Heart disease Mother    Cancer Father        thyroid  cancer   Juvenile Rhematoid Arthritis Sister    Stroke Sister    Thyroid  cancer Brother    Thyroid  cancer Daughter    Healthy Daughter    Healthy Daughter    Healthy Son    Colon cancer Neg Hx    Esophageal cancer Neg Hx    Pancreatic cancer Neg Hx    Stomach cancer Neg Hx    Liver disease Neg Hx    Rectal cancer Neg Hx    Past Surgical History:  Procedure Laterality Date   ABDOMINAL HYSTERECTOMY  1993   CHOLECYSTECTOMY  1982   EXTRACORPOREAL SHOCK WAVE LITHOTRIPSY Left 09/17/2020   Procedure: EXTRACORPOREAL SHOCK WAVE LITHOTRIPSY (ESWL);  Surgeon: Matilda Senior, MD;  Location: [facility];  Service: Urology;  Laterality: Left;   LAPAROSCOPIC GASTRIC SLEEVE RESECTION  07/2018   left knee replacement  2008   ORIF FEMUR FRACTURE  04/13/2022   partial knee revision  04/13/2022   right ankle replacement  2012   right knee replacement  2009   trimallar FX  1995  right ankle   Social History   Social History Narrative   Not on file   Immunization History  Administered Date(s) Administered   Fluad Quad(high Dose 65+) 06/29/2021   Influenza-Unspecified 05/22/2019, 06/10/2020, 06/27/2022, 06/06/2023   PFIZER Comirnaty(Gray Top)Covid-19 Tri-Sucrose Vaccine 05/08/2021   PFIZER(Purple Top)SARS-COV-2 Vaccination 11/17/2019, 12/08/2019, 06/10/2020   PNEUMOCOCCAL CONJUGATE-20 03/15/2023   Pfizer Covid-19 Vaccine Bivalent Booster 52yrs & up  08/19/2021   Pfizer(Comirnaty)Fall Seasonal Vaccine 12 years and older 03/16/2024   Pneumococcal Conjugate-13 06/07/2019   Pneumococcal Polysaccharide-23 08/18/2020   Respiratory Syncytial Virus Vaccine,Recomb Aduvanted(Arexvy) 06/18/2022   Tdap 06/07/2019   Zoster Recombinant(Shingrix) 12/23/2020, 02/27/2021     Objective: Vital Signs: There were no vitals taken for this visit.   Physical Exam Vitals and nursing note reviewed.  Constitutional:      Appearance: She is well-developed.  HENT:     Head: Normocephalic and atraumatic.  Eyes:     Conjunctiva/sclera: Conjunctivae normal.  Cardiovascular:     Rate and Rhythm: Normal rate and regular rhythm.     Heart sounds: Normal heart sounds.  Pulmonary:     Effort: Pulmonary effort is normal.     Breath sounds: Normal breath sounds.  Abdominal:     General: Bowel sounds are normal.     Palpations: Abdomen is soft.  Musculoskeletal:     Cervical back: Normal range of motion.  Lymphadenopathy:     Cervical: No cervical adenopathy.  Skin:    General: Skin is warm and dry.     Capillary Refill: Capillary refill takes less than 2 seconds.  Neurological:     Mental Status: She is alert and oriented to person, place, and time.  Psychiatric:        Behavior: Behavior normal.      Musculoskeletal Exam: ***  CDAI Exam: CDAI Score: -- Patient Global: --; Provider Global: -- Swollen: --; Tender: -- Joint Exam 05/14/2024   No joint exam has been documented for this visit   There is currently no information documented on the homunculus. Go to the Rheumatology activity and complete the homunculus joint exam.  Investigation: No additional findings.  Imaging: No results found.  Recent Labs: Lab Results  Component Value Date   WBC 6.9 12/13/2023   HGB 13.6 12/13/2023   PLT 188 12/13/2023   NA 137 12/13/2023   K 4.6 12/13/2023   CL 103 12/13/2023   CO2 26 12/13/2023   GLUCOSE 88 12/13/2023   BUN 26 (H) 12/13/2023    CREATININE 0.87 12/13/2023   BILITOT 0.6 12/13/2023   AST 26 12/13/2023   ALT 18 12/13/2023   PROT 6.9 12/13/2023   CALCIUM 9.2 12/13/2023   GFRAA 93 11/27/2020    Speciality Comments: Plaquenil  eye exam: 12/27/2022  [facility]  Follow up in 1 year. --[facility] [phone] 12/13/2023 and left message to obtain updated eye exam--  DEXA at [facility] through PCP  Procedures:  No procedures performed Allergies: Lovenox [enoxaparin sodium]   Assessment / Plan:     Visit Diagnoses: Rheumatoid arthritis of multiple sites with negative rheumatoid factor (HCC) + anti-CCP  High risk medication use  Primary osteoarthritis of both hands  Status post closed fracture of left femur  Status post total bilateral knee replacement  Status post right ankle joint replacement  Osteopenia of multiple sites  Vitamin D  deficiency  Essential hypertension  History of asthma  Dyslipidemia  Gastroesophageal reflux disease without esophagitis  History of thyroid  cancer  Orders:  No orders of the defined types were placed in this encounter.  No orders of the defined types were placed in this encounter.   Face-to-face time spent with patient was *** minutes. Greater than 50% of time was spent in counseling and coordination of care.  Follow-Up Instructions: No follow-ups on file.   Waddell CHRISTELLA Craze, PA-C  Note - This record has been created using Dragon software.  Chart creation errors have been sought, but may not always  have been located. Such creation errors do not reflect on  the standard of medical care.

## 2024-05-06 DIAGNOSIS — H25042 Posterior subcapsular polar age-related cataract, left eye: Secondary | ICD-10-CM | POA: Diagnosis not present

## 2024-05-06 DIAGNOSIS — H2513 Age-related nuclear cataract, bilateral: Secondary | ICD-10-CM | POA: Diagnosis not present

## 2024-05-06 DIAGNOSIS — H2512 Age-related nuclear cataract, left eye: Secondary | ICD-10-CM | POA: Diagnosis not present

## 2024-05-06 DIAGNOSIS — H25012 Cortical age-related cataract, left eye: Secondary | ICD-10-CM | POA: Diagnosis not present

## 2024-05-06 DIAGNOSIS — Z961 Presence of intraocular lens: Secondary | ICD-10-CM | POA: Diagnosis not present

## 2024-05-14 ENCOUNTER — Ambulatory Visit: Admitting: Physician Assistant

## 2024-05-14 DIAGNOSIS — Z96653 Presence of artificial knee joint, bilateral: Secondary | ICD-10-CM

## 2024-05-14 DIAGNOSIS — Z79899 Other long term (current) drug therapy: Secondary | ICD-10-CM

## 2024-05-14 DIAGNOSIS — Z85850 Personal history of malignant neoplasm of thyroid: Secondary | ICD-10-CM

## 2024-05-14 DIAGNOSIS — I10 Essential (primary) hypertension: Secondary | ICD-10-CM

## 2024-05-14 DIAGNOSIS — M19041 Primary osteoarthritis, right hand: Secondary | ICD-10-CM

## 2024-05-14 DIAGNOSIS — K219 Gastro-esophageal reflux disease without esophagitis: Secondary | ICD-10-CM

## 2024-05-14 DIAGNOSIS — E559 Vitamin D deficiency, unspecified: Secondary | ICD-10-CM

## 2024-05-14 DIAGNOSIS — M0609 Rheumatoid arthritis without rheumatoid factor, multiple sites: Secondary | ICD-10-CM

## 2024-05-14 DIAGNOSIS — Z8709 Personal history of other diseases of the respiratory system: Secondary | ICD-10-CM

## 2024-05-14 DIAGNOSIS — Z96661 Presence of right artificial ankle joint: Secondary | ICD-10-CM

## 2024-05-14 DIAGNOSIS — E785 Hyperlipidemia, unspecified: Secondary | ICD-10-CM

## 2024-05-14 DIAGNOSIS — M8589 Other specified disorders of bone density and structure, multiple sites: Secondary | ICD-10-CM

## 2024-05-14 DIAGNOSIS — Z8781 Personal history of (healed) traumatic fracture: Secondary | ICD-10-CM

## 2024-05-17 ENCOUNTER — Telehealth: Payer: Self-pay | Admitting: Family Medicine

## 2024-05-17 NOTE — Telephone Encounter (Signed)
 Patient mailed document Handicap Placard, to be filled out by provider. Patient requested to send it back via Call Patient to pick up within 7-days. Document is located in providers tray at front office.Please advise at [facility] [phone]

## 2024-05-17 NOTE — Telephone Encounter (Signed)
In bin to be signed.

## 2024-05-17 NOTE — Telephone Encounter (Signed)
 I left pt a voicemail letting her know the form is completed.

## 2024-05-17 NOTE — Telephone Encounter (Signed)
Signed. Thanks, BJ

## 2024-06-02 ENCOUNTER — Other Ambulatory Visit: Payer: Self-pay | Admitting: Family Medicine

## 2024-06-02 DIAGNOSIS — I10 Essential (primary) hypertension: Secondary | ICD-10-CM

## 2024-06-11 NOTE — Progress Notes (Signed)
 Office Visit Note  Patient: Mercedes Hodge             Date of Birth: [birth year]           MRN: [id number]             PCP: [place], Betty G, MD Referring: [place], Betty G, MD Visit Date: 06/25/2024 Occupation: @GUAROCC @  Subjective:  Left CMC joint pain   History of Present Illness: Mercedes Hodge is a 68 y.o. female with history of rheumatoid arthritis.  Patient is currently taking Plaquenil  200 mg 1 tablet p.o. twice daily.  She is tolerating Plaquenil  without any side effects and has not had any gaps in therapy.  She denies any recent rheumatoid arthritis flares.  Patient reports that her husband recently moved so she was performing some repetitive and overuse activities with her hands.  She had an exacerbation of pain involving the left [facility] joint which lasted for several days.  She has Voltaren gel topically as well as a thumb brace for support and her symptoms have subsided.  Patient states yesterday she strained a muscle in her lower back while she was cleaning along the baseboards.  She denies any other joint pain or joint swelling at this time.  She denies any recent or recurrent infections.  She denies any new medical conditions.   Activities of Daily Living:  Patient reports morning stiffness for 15 minutes.   Patient Denies nocturnal pain.  Difficulty dressing/grooming: Denies Difficulty climbing stairs: Denies Difficulty getting out of chair: Reports Difficulty using hands for taps, buttons, cutlery, and/or writing: Reports  Review of Systems  Constitutional:  Negative for fatigue.  HENT:  Negative for mouth sores and mouth dryness.   Eyes:  Negative for dryness.  Respiratory:  Negative for shortness of breath.   Cardiovascular:  Negative for chest pain and palpitations.  Gastrointestinal:  Positive for constipation. Negative for blood in stool and diarrhea.  Endocrine: Negative for increased urination.  Genitourinary:  Negative for involuntary urination.   Musculoskeletal:  Positive for joint pain, gait problem, joint pain, joint swelling and morning stiffness. Negative for myalgias, muscle weakness, muscle tenderness and myalgias.  Skin:  Positive for sensitivity to sunlight. Negative for color change, rash and hair loss.  Allergic/Immunologic: Negative for susceptible to infections.  Neurological:  Negative for dizziness and headaches.  Hematological:  Negative for swollen glands.  Psychiatric/Behavioral:  Negative for depressed mood and sleep disturbance. The patient is not nervous/anxious.     PMFS History:  Patient Active Problem List   Diagnosis Date Noted   Routine general medical examination at a health care facility 01/02/2023   Ganglion cyst of flexor tendon sheath 03/04/2022   Depression, major, in remission 06/29/2021   Aortic atherosclerosis 08/18/2020   History of colonic polyps 07/20/2020   Atrophic vaginitis 02/17/2020   Rheumatoid arthritis of multiple sites with negative rheumatoid factor (HCC) 07/02/2019   High risk medication use 07/02/2019   Primary osteoarthritis of both hands 07/02/2019   Status post total bilateral knee replacement 07/02/2019   Status post right ankle joint replacement 07/02/2019   Essential hypertension 07/02/2019   Asthma, intermittent, uncomplicated 07/02/2019   History of thyroid  cancer 07/02/2019   Postoperative hypothyroidism 07/02/2019   Dyslipidemia 07/02/2019   Gastroesophageal reflux disease without esophagitis 07/02/2019   Osteopenia of multiple sites 07/02/2019    Past Medical History:  Diagnosis Date   Allergy    Arthritis    Asthma    GERD (gastroesophageal reflux  disease)    Hyperlipidemia    Hypertension    Thyroid  cancer (HCC)    thyroid    Thyroid  disease    Postsurgical hypothyroidism    Family History  Problem Relation Age of Onset   COPD Mother    Heart disease Mother    Cancer Father        thyroid  cancer   Juvenile Rhematoid Arthritis Sister    Stroke  Sister    Thyroid  cancer Brother    Thyroid  cancer Daughter    Healthy Daughter    Healthy Daughter    Healthy Son    Colon cancer Neg Hx    Esophageal cancer Neg Hx    Pancreatic cancer Neg Hx    Stomach cancer Neg Hx    Liver disease Neg Hx    Rectal cancer Neg Hx    Past Surgical History:  Procedure Laterality Date   ABDOMINAL HYSTERECTOMY  1993   CHOLECYSTECTOMY  1982   EXTRACORPOREAL SHOCK WAVE LITHOTRIPSY Left 09/17/2020   Procedure: EXTRACORPOREAL SHOCK WAVE LITHOTRIPSY (ESWL);  Surgeon: Matilda Senior, MD;  Location: [facility];  Service: Urology;  Laterality: Left;   LAPAROSCOPIC GASTRIC SLEEVE RESECTION  07/2018   left knee replacement  2008   ORIF FEMUR FRACTURE  04/13/2022   partial knee revision  04/13/2022   right ankle replacement  2012   right knee replacement  2009   trimallar FX  1995   right ankle   Social History   Social History Narrative   Not on file   Immunization History  Administered Date(s) Administered   Fluad Quad(high Dose 65+) 06/29/2021   INFLUENZA, HIGH DOSE SEASONAL PF 06/24/2024   Influenza-Unspecified 05/22/2019, 06/10/2020, 06/27/2022, 06/06/2023   PFIZER Comirnaty(Gray Top)Covid-19 Tri-Sucrose Vaccine 05/08/2021   PFIZER(Purple Top)SARS-COV-2 Vaccination 11/17/2019, 12/08/2019, 06/10/2020   PNEUMOCOCCAL CONJUGATE-20 03/15/2023   Pfizer Covid-19 Vaccine Bivalent Booster 34yrs & up 08/19/2021   Pfizer(Comirnaty)Fall Seasonal Vaccine 12 years and older 03/16/2024   Pneumococcal Conjugate-13 06/07/2019   Pneumococcal Polysaccharide-23 08/18/2020   Respiratory Syncytial Virus Vaccine,Recomb Aduvanted(Arexvy) 06/18/2022   Tdap 06/07/2019   Zoster Recombinant(Shingrix) 12/23/2020, 02/27/2021     Objective: Vital Signs: BP 118/77   Pulse 73   Temp 97.7 F (36.5 C)   Resp 14   Ht 5' 6 (1.676 m)   Wt 189 lb (85.7 kg)   BMI 30.51 kg/m    Physical Exam Vitals and nursing note reviewed.  Constitutional:       Appearance: She is well-developed.  HENT:     Head: Normocephalic and atraumatic.  Eyes:     Conjunctiva/sclera: Conjunctivae normal.  Cardiovascular:     Rate and Rhythm: Normal rate and regular rhythm.     Heart sounds: Normal heart sounds.  Pulmonary:     Effort: Pulmonary effort is normal.     Breath sounds: Normal breath sounds.  Abdominal:     General: Bowel sounds are normal.     Palpations: Abdomen is soft.  Musculoskeletal:     Cervical back: Normal range of motion.  Lymphadenopathy:     Cervical: No cervical adenopathy.  Skin:    General: Skin is warm and dry.     Capillary Refill: Capillary refill takes less than 2 seconds.  Neurological:     Mental Status: She is alert and oriented to person, place, and time.  Psychiatric:        Behavior: Behavior normal.      Musculoskeletal Exam: C-spine, thoracic spine, and lumbar spine  have good range of motion.  Shoulder joints, elbow joints, wrist joints, MCPs, PIPs, DIPs have good range of motion with no synovitis.  CMC joint prominence and thickening bilaterally.  Some tenderness at the left [facility] joint.  PIP and DIP thickening consistent with osteoarthritis of both hands.  Hip joints have good range of motion with no groin pain.  Knee replacements have good range of motion with mild warmth of the right knee but no effusion noted.  Right ankle is replaced with limited range of motion and thickening.  Left ankle joint has good range of motion with no tenderness or joint swelling.  CDAI Exam: CDAI Score: -- Patient Global: --; Provider Global: -- Swollen: --; Tender: -- Joint Exam 06/25/2024   No joint exam has been documented for this visit   There is currently no information documented on the homunculus. Go to the Rheumatology activity and complete the homunculus joint exam.  Investigation: No additional findings.  Imaging: No results found.  Recent Labs: Lab Results  Component Value Date   WBC 6.9 12/13/2023   HGB  13.6 12/13/2023   PLT 188 12/13/2023   NA 137 12/13/2023   K 4.6 12/13/2023   CL 103 12/13/2023   CO2 26 12/13/2023   GLUCOSE 88 12/13/2023   BUN 26 (H) 12/13/2023   CREATININE 0.87 12/13/2023   BILITOT 0.6 12/13/2023   AST 26 12/13/2023   ALT 18 12/13/2023   PROT 6.9 12/13/2023   CALCIUM 9.2 12/13/2023   GFRAA 93 11/27/2020    Speciality Comments: PLQ eye exam: 06/13/2024 WNL @ [facility] f/u 1 year  DEXA at [facility] through PCP  Procedures:  No procedures performed Allergies: Lovenox [enoxaparin sodium]   Assessment / Plan:     Visit Diagnoses: Rheumatoid arthritis of multiple sites with negative rheumatoid factor (HCC) + anti-CCP -She has not had any signs or symptoms of a rheumatoid arthritis flare.  She has clinically been doing well taking Plaquenil  200 mg 1 tablet by mouth twice daily.  She is tolerating Plaquenil  without any side effects and has not had any gaps in therapy.  Patient recently moved and was having to perform more repetitive and overuse activities.  She had exacerbation of pain involving the left CMC joint due to underlying osteoarthritis.  She applied Voltaren gel and used a thumb brace for support which was helpful.  Overall her symptoms remain well-controlled taking Plaquenil  as prescribed.  She was advised to notify us  if she develops signs or symptoms of a flare.  She will follow-up in the office in 5 months or sooner if needed.  Plan: Lipid panel  High risk medication use - Plaquenil  200 mg 1 tablet by mouth twice daily.  CBC and CMP updated on 12/13/23. Orders for CBC and CMP released today.   Plaquenil  eye exam: 06/13/24-normal-recommend yearly follow-up.  - Plan: CBC with Differential/Platelet, Comprehensive metabolic panel with GFR, Lipid panel  Primary osteoarthritis of both hands: CMC, PIP, DIP thickening consistent with osteoarthritis of both hands.  Patient recently had an exacerbation of pain involving the left [facility] joint which  she attributes to performing repetitive and overuse activities due to recently moving.  She applied Voltaren gel topically and tried using a brace for support which was helpful.  Status post closed fracture of left femur - -She fell in February 2023 while she was in [place] .  She had open reduction internal fixation of the right femur. Followed by Dr. Fidel.  Status post total  bilateral knee replacement - Right total knee replacement in 2009 and left total knee replacement in 2008. Doing well.  Mild warmth of the right knee but no effusion.   Status post right ankle joint replacement: Doing well. Limited ROM and thickening.    Osteopenia of multiple sites - DEXA updated on 10/05/22 T-score -1.6 lumbar spine, right femoral neck -1.5.  She is taking vitamin D  2000 units daily.  Due to update DEXA in Jauary 2026.   Vitamin D  deficiency: She is taking vitamin D  2000 units daily.    Other medical conditions are listed as follows:   Essential hypertension: BP was 88122 today in the office.   History of asthma  Dyslipidemia -Plan to update lipid panel today-results will be forwarded to Dr. [place].  She remains on pravastatin  daily.  Plan: Lipid panel  Gastroesophageal reflux disease without esophagitis  History of thyroid  cancer - Plan to check TSH and T4 as requested by the patient-results will be forwarded to Dr. [place]. Plan: TSH, T4, free  Orders: Orders Placed This Encounter  Procedures   CBC with Differential/Platelet   Comprehensive metabolic panel with GFR   Lipid panel   TSH   T4, free   No orders of the defined types were placed in this encounter.    Follow-Up Instructions: Return in about 5 months (around 11/23/2024) for Rheumatoid arthritis.   Waddell CHRISTELLA Craze, PA-C  Note - This record has been created using Dragon software.  Chart creation errors have been sought, but may not always  have been located. Such creation errors do not reflect on  the standard of  medical care.

## 2024-06-13 DIAGNOSIS — H43393 Other vitreous opacities, bilateral: Secondary | ICD-10-CM | POA: Diagnosis not present

## 2024-06-13 LAB — OPHTHALMOLOGY REPORT-SCANNED

## 2024-06-24 ENCOUNTER — Ambulatory Visit: Admitting: Family Medicine

## 2024-06-24 ENCOUNTER — Encounter: Payer: Self-pay | Admitting: Family Medicine

## 2024-06-24 VITALS — BP 120/80 | HR 67 | Temp 97.8°F | Resp 16 | Ht 66.0 in | Wt 192.2 lb

## 2024-06-24 DIAGNOSIS — E785 Hyperlipidemia, unspecified: Secondary | ICD-10-CM | POA: Diagnosis not present

## 2024-06-24 DIAGNOSIS — Z23 Encounter for immunization: Secondary | ICD-10-CM | POA: Diagnosis not present

## 2024-06-24 DIAGNOSIS — E890 Postprocedural hypothyroidism: Secondary | ICD-10-CM

## 2024-06-24 DIAGNOSIS — I10 Essential (primary) hypertension: Secondary | ICD-10-CM | POA: Diagnosis not present

## 2024-06-24 DIAGNOSIS — J4520 Mild intermittent asthma, uncomplicated: Secondary | ICD-10-CM

## 2024-06-24 NOTE — Assessment & Plan Note (Signed)
 Continue pravastatin  40 mg daily and low-fat diet. She is not fasting today. She is having blood work tomorrow at her rheumatologist's office, lipid panel order added.

## 2024-06-24 NOTE — Assessment & Plan Note (Signed)
 S/P total thyroidectomy due to papillary thyroid  cancer.  Last TSH 2.3 in 02/2023. Currently on levothyroxine  137 mcg 1/2 tablet Tuesdays and Thursdays and 1 tablet the rest of the days. TSH to be added to next blood work at her rheumatologist's office, tomorrow.

## 2024-06-24 NOTE — Progress Notes (Signed)
 Chief Complaint  Patient presents with   Medical Management of Chronic Issues   Discussed the use of AI scribe software for clinical note transcription with the patient, who gave verbal consent to proceed.  History of Present Illness Mercedes Hodge is a 68 year old female with PMHx significant for RA on Plaquenil  200 mg bid, HTN,HLD,asthma, and OA who presents for follow-up on chronic medical conditions.  She was last seen on 09/29/23 for acute visit. She has not had urinary symptoms since then, currently on Estradiol  vaginal cream, which has helped with prevention using it as needed.  No new problems since her last visit.  HTN: She reports home blood pressure readings consistently between 120/72 to 120/80 mmHg. She is currently on amlodipine -benazepril  5-40 mg daily. Negative for unusual or severe headache, visual changes, exertional chest pain, dyspnea,  focal weakness, or edema. She has blood work regularly at her rheumatologist's office, having next lab appt tomorrow.  Lab Results  Component Value Date   CREATININE 0.87 12/13/2023   BUN 26 (H) 12/13/2023   NA 137 12/13/2023   K 4.6 12/13/2023   CL 103 12/13/2023   CO2 26 12/13/2023   She underwent cataract surgery on both eyes in August 2025 and reports that vision greatly improved after procedure.  Hypothyroidism:S/P total thyroidectomy due to papillary thyroid  cancer.  Currently on Levothyroxine  137 mcg daily x 5 d then 1/2 tab T-T. Tolerating medication well, no side effects reported. Negative for dysphagia, palpitations, changes in bowel habits, tremor, cold/heat intolerance, or abnormal weight changes.  Lab Results  Component Value Date   TSH 2.34 02/21/2023   Hyperlipidemia:Currently on Pravastatin  40 mg daily. Lab Results  Component Value Date   CHOL 186 02/21/2023   HDL 82.90 02/21/2023   LDLCALC 87 02/21/2023   TRIG 81.0 02/21/2023   CHOLHDL 2 02/21/2023   Asthma on Wixela 250-50 mcg bid,  prn.  Review of Systems  Constitutional:  Negative for activity change, appetite change, chills and fever.  HENT:  Negative for sore throat.   Respiratory:  Negative for cough and wheezing.   Gastrointestinal:  Negative for abdominal pain, nausea and vomiting.  Genitourinary:  Negative for decreased urine volume, dysuria and hematuria.  Musculoskeletal:  Positive for back pain (She experienced a recent episode of back pain after lifting something heavy, which is improving.).  Allergic/Immunologic: Positive for environmental allergies.  Neurological:  Negative for syncope and facial asymmetry.  See other pertinent positives and negatives in HPI.  Current Outpatient Medications on File Prior to Visit  Medication Sig Dispense Refill   amLODipine -benazepril  (LOTREL) 5-40 MG capsule TAKE 1 CAPSULE BY MOUTH DAILY 100 capsule 2   calcium carbonate (OS-CAL) 600 MG tablet Take 600 mg by mouth daily.     Cholecalciferol (VITAMIN D ) 50 MCG (2000 UT) tablet Take 2,000 Units by mouth daily.     diclofenac sodium (VOLTAREN) 1 % GEL Apply topically as needed.     estradiol  (ESTRACE ) 0.1 MG/GM vaginal cream Place 1 Applicatorful vaginally once a week. (Patient taking differently: Place 1 Applicatorful vaginally as needed.) 42.5 g 3   fluticasone  (FLONASE) 50 MCG/ACT nasal spray Place into both nostrils as needed for allergies or rhinitis.     fluticasone -salmeterol (WIXELA INHUB) 250-50 MCG/ACT AEPB Inhale 1 puff into the lungs in the morning and at bedtime. 180 each 2   hydrocortisone  (ANUSOL -HC) 25 MG suppository Place 1 suppository (25 mg total) rectally 2 (two) times daily. Use for 5 days and as  directed 12 suppository 1   hydroxychloroquine  (PLAQUENIL ) 200 MG tablet TAKE 1 TABLET BY MOUTH TWICE  DAILY 180 tablet 0   levothyroxine  (SYNTHROID ) 137 MCG tablet TAKE 1 TABLET BY MOUTH DAILY  EXCEPT TAKE ONE-HALF TABLET BY  MOUTH ON TUESDAYS AND THURSDAYS 86 tablet 2   pramoxine-hydrocortisone  (ANALPRAM  HC)  cream Apply topically 2 (two) times daily as needed. For 7 days 30 g 6   pravastatin  (PRAVACHOL ) 40 MG tablet TAKE 1 TABLET BY MOUTH DAILY 100 tablet 2   amoxicillin (AMOXIL) 500 MG capsule Take 500 mg by mouth 2 (two) times daily. For dental procedures (Patient not taking: Reported on 06/24/2024)     No current facility-administered medications on file prior to visit.    Past Medical History:  Diagnosis Date   Allergy    Arthritis    Asthma    GERD (gastroesophageal reflux disease)    Hyperlipidemia    Hypertension    Thyroid  cancer (HCC)    thyroid    Thyroid  disease    Postsurgical hypothyroidism    Allergies  Allergen Reactions   Lovenox [Enoxaparin Sodium] Rash    rash    Social History   Socioeconomic History   Marital status: Married    Spouse name: Not on file   Number of children: Not on file   Years of education: Not on file   Highest education level: Not on file  Occupational History   Not on file  Tobacco Use   Smoking status: Never    Passive exposure: Past   Smokeless tobacco: Never  Vaping Use   Vaping status: Never Used  Substance and Sexual Activity   Alcohol use: Yes    Comment: occ wine    Drug use: Never   Sexual activity: Yes    Birth control/protection: None  Other Topics Concern   Not on file  Social History Narrative   Not on file   Social Drivers of Health   Financial Resource Strain: Low Risk  (10/16/2023)   Overall Financial Resource Strain (CARDIA)    Difficulty of Paying Living Expenses: Not hard at all  Food Insecurity: No Food Insecurity (10/16/2023)   Hunger Vital Sign    Worried About Running Out of Food in the Last Year: Never true    Ran Out of Food in the Last Year: Never true  Transportation Needs: No Transportation Needs (10/16/2023)   PRAPARE - Administrator, Civil Service (Medical): No    Lack of Transportation (Non-Medical): No  Physical Activity: Sufficiently Active (10/16/2023)   Exercise Vital Sign     Days of Exercise per Week: 3 days    Minutes of Exercise per Session: 150+ min  Stress: No Stress Concern Present (10/16/2023)   [employer] of Occupational Health - Occupational Stress Questionnaire    Feeling of Stress : Not at all  Social Connections: Socially Integrated (10/16/2023)   Social Connection and Isolation Panel    Frequency of Communication with Friends and Family: More than three times a week    Frequency of Social Gatherings with Friends and Family: More than three times a week    Attends Religious Services: More than 4 times per year    Active Member of [employer] or Organizations: Yes    Attends Banker Meetings: More than 4 times per year    Marital Status: Married    Today's Vitals   06/24/24 1259  BP: 120/80  Pulse: 67  Resp: 16  Temp: 97.8  F (36.6 C)  SpO2: 97%  Weight: 192 lb 3.2 oz (87.2 kg)  Height: 5' 6 (1.676 m)   Body mass index is 31.02 kg/m.  Physical Exam Vitals and nursing note reviewed.  Constitutional:      General: She is not in acute distress.    Appearance: She is well-developed.  HENT:     Head: Normocephalic and atraumatic.     Mouth/Throat:     Mouth: Mucous membranes are moist.     Pharynx: Oropharynx is clear.  Eyes:     Conjunctiva/sclera: Conjunctivae normal.  Cardiovascular:     Rate and Rhythm: Normal rate and regular rhythm.     Pulses:          Dorsalis pedis pulses are 2+ on the right side and 2+ on the left side.     Heart sounds: No murmur heard. Pulmonary:     Effort: Pulmonary effort is normal. No respiratory distress.     Breath sounds: Normal breath sounds.  Abdominal:     Palpations: Abdomen is soft. There is no hepatomegaly or mass.     Tenderness: There is no abdominal tenderness.  Skin:    General: Skin is warm.     Findings: No erythema or rash.  Neurological:     General: No focal deficit present.     Mental Status: She is alert and oriented to person, place, and time.     Cranial  Nerves: No cranial nerve deficit.     Gait: Gait normal.  Psychiatric:        Mood and Affect: Mood and affect normal.    ASSESSMENT AND PLAN:  Ms. Ilani Otterson was seen today for medical management of chronic issues.  Diagnoses and all orders for this visit:  Orders Placed This Encounter  Procedures   Flu vaccine HIGH DOSE PF(Fluzone Trivalent)   TSH   T4, free   Lipid panel   Lab Results  Component Value Date   CHOL 184 06/25/2024   HDL 83 06/25/2024   LDLCALC 81 06/25/2024   TRIG 100 06/25/2024   CHOLHDL 2.2 06/25/2024   Lab Results  Component Value Date   TSH 7.33 (H) 06/25/2024   Lab Results  Component Value Date   NA 135 06/25/2024   CL 99 06/25/2024   K 4.0 06/25/2024   CO2 28 06/25/2024   BUN 23 06/25/2024   CREATININE 0.84 06/25/2024   EGFR 75 06/25/2024   CALCIUM 9.4 06/25/2024   GLUCOSE 102 (H) 06/25/2024   Dyslipidemia Assessment & Plan: Continue pravastatin  40 mg daily and low-fat diet. She is not fasting today. She is having blood work tomorrow at her rheumatologist's office, lipid panel order added.  Orders: -     Lipid panel; Future  Postoperative hypothyroidism Assessment & Plan: S/P total thyroidectomy due to papillary thyroid  cancer.  Last TSH 2.3 in 02/2023. Currently on levothyroxine  137 mcg 1/2 tablet Tuesdays and Thursdays and 1 tablet the rest of the days. TSH to be added to next blood work at her rheumatologist's office, tomorrow.  TSH elevated, so will recommend increasing Levothyroxine  to 1 tab daily and TSH to be repeated in 6-8 weeks.MyChart message sent.  Orders: -     TSH; Future -     T4, free; Future  Essential hypertension Assessment & Plan: Problem is well-controlled. Continue amlodipine - benazepril  5-40 mg daily and low salt diet. Continue monitoring BP at home regularly. Eye exam is current. As far as the problem is stable,  annual follow-up can be continued.   Asthma, intermittent,  uncomplicated Assessment & Plan: Problem is well controlled, no concerns about this today. Continue Symbicort  160-4.5 mcg twice daily as needed. Continue annual follow ups.   Need for immunization against influenza -     Flu vaccine HIGH DOSE PF(Fluzone Trivalent)   Return in about 1 year (around 06/24/2025) for CPE, chronic problems.   Angee Gupton [place], MD [facility]. [place] office.

## 2024-06-24 NOTE — Patient Instructions (Addendum)
 A few things to remember from today's visit:  Dyslipidemia  Essential hypertension  Need for immunization against influenza - Plan: Flu vaccine HIGH DOSE PF(Fluzone Trivalent)  Postoperative hypothyroidism - Plan: TSH, T4, free  No changes today. Placed order for TSH, so it can be done with your next blood work. We can check cholesterol next year.  If you need refills for medications you take chronically, please call your pharmacy. Do not use My Chart to request refills or for acute issues that need immediate attention. If you send a my chart message, it may take a few days to be addressed, specially if I am not in the office.  Please be sure medication list is accurate. If a new problem present, please set up appointment sooner than planned today.

## 2024-06-24 NOTE — Assessment & Plan Note (Addendum)
 Problem is well-controlled. Continue amlodipine - benazepril  5-40 mg daily and low salt diet. Continue monitoring BP at home regularly. Eye exam is current. As far as the problem is stable, annual follow-up can be continued.

## 2024-06-25 ENCOUNTER — Encounter: Payer: Self-pay | Admitting: Physician Assistant

## 2024-06-25 ENCOUNTER — Ambulatory Visit: Attending: Physician Assistant | Admitting: Physician Assistant

## 2024-06-25 VITALS — BP 118/77 | HR 73 | Temp 97.7°F | Resp 14 | Ht 66.0 in | Wt 189.0 lb

## 2024-06-25 DIAGNOSIS — M19041 Primary osteoarthritis, right hand: Secondary | ICD-10-CM | POA: Diagnosis not present

## 2024-06-25 DIAGNOSIS — M0609 Rheumatoid arthritis without rheumatoid factor, multiple sites: Secondary | ICD-10-CM | POA: Diagnosis not present

## 2024-06-25 DIAGNOSIS — I10 Essential (primary) hypertension: Secondary | ICD-10-CM | POA: Diagnosis not present

## 2024-06-25 DIAGNOSIS — E785 Hyperlipidemia, unspecified: Secondary | ICD-10-CM | POA: Diagnosis not present

## 2024-06-25 DIAGNOSIS — Z96661 Presence of right artificial ankle joint: Secondary | ICD-10-CM | POA: Diagnosis not present

## 2024-06-25 DIAGNOSIS — Z8709 Personal history of other diseases of the respiratory system: Secondary | ICD-10-CM | POA: Diagnosis not present

## 2024-06-25 DIAGNOSIS — K219 Gastro-esophageal reflux disease without esophagitis: Secondary | ICD-10-CM | POA: Diagnosis not present

## 2024-06-25 DIAGNOSIS — Z85850 Personal history of malignant neoplasm of thyroid: Secondary | ICD-10-CM | POA: Diagnosis not present

## 2024-06-25 DIAGNOSIS — M8589 Other specified disorders of bone density and structure, multiple sites: Secondary | ICD-10-CM | POA: Diagnosis not present

## 2024-06-25 DIAGNOSIS — E559 Vitamin D deficiency, unspecified: Secondary | ICD-10-CM | POA: Diagnosis not present

## 2024-06-25 DIAGNOSIS — Z79899 Other long term (current) drug therapy: Secondary | ICD-10-CM | POA: Diagnosis not present

## 2024-06-25 DIAGNOSIS — Z96653 Presence of artificial knee joint, bilateral: Secondary | ICD-10-CM

## 2024-06-25 DIAGNOSIS — Z8781 Personal history of (healed) traumatic fracture: Secondary | ICD-10-CM

## 2024-06-25 DIAGNOSIS — M19042 Primary osteoarthritis, left hand: Secondary | ICD-10-CM

## 2024-06-26 ENCOUNTER — Ambulatory Visit: Payer: Self-pay | Admitting: Physician Assistant

## 2024-06-26 LAB — COMPREHENSIVE METABOLIC PANEL WITH GFR
AG Ratio: 1.7 (calc) (ref 1.0–2.5)
ALT: 16 U/L (ref 6–29)
AST: 28 U/L (ref 10–35)
Albumin: 4.3 g/dL (ref 3.6–5.1)
Alkaline phosphatase (APISO): 48 U/L (ref 37–153)
BUN: 23 mg/dL (ref 7–25)
CO2: 28 mmol/L (ref 20–32)
Calcium: 9.4 mg/dL (ref 8.6–10.4)
Chloride: 99 mmol/L (ref 98–110)
Creat: 0.84 mg/dL (ref 0.60–1.00)
Globulin: 2.6 g/dL (ref 1.9–3.7)
Glucose, Bld: 102 mg/dL — ABNORMAL HIGH (ref 65–99)
Potassium: 4 mmol/L (ref 3.5–5.3)
Sodium: 135 mmol/L (ref 135–146)
Total Bilirubin: 0.7 mg/dL (ref 0.2–1.2)
Total Protein: 6.9 g/dL (ref 6.1–8.1)
eGFR: 75 mL/min/1.73m2 (ref >=60)

## 2024-06-26 LAB — CBC WITH DIFFERENTIAL/PLATELET
Absolute Lymphocytes: 1118 {cells}/uL (ref 850–3900)
Absolute Monocytes: 676 {cells}/uL (ref 200–950)
Basophils Absolute: 72 {cells}/uL (ref 0–200)
Basophils Relative: 1.1 %
Eosinophils Absolute: 130 {cells}/uL (ref 15–500)
Eosinophils Relative: 2 %
HCT: 41.1 % (ref 35.0–45.0)
Hemoglobin: 14.4 g/dL (ref 11.7–15.5)
MCH: 35.8 pg — ABNORMAL HIGH (ref 27.0–33.0)
MCHC: 35 g/dL (ref 32.0–36.0)
MCV: 102.2 fL — ABNORMAL HIGH (ref 80.0–100.0)
MPV: 9.8 fL (ref 7.5–12.5)
Monocytes Relative: 10.4 %
Neutro Abs: 4505 {cells}/uL (ref 1500–7800)
Neutrophils Relative %: 69.3 %
Platelets: 197 Thousand/uL (ref 140–400)
RBC: 4.02 Million/uL (ref 3.80–5.10)
RDW: 12.2 % (ref 11.0–15.0)
Total Lymphocyte: 17.2 %
WBC: 6.5 Thousand/uL (ref 3.8–10.8)

## 2024-06-26 LAB — T4, FREE: Free T4: 1.1 ng/dL (ref 0.8–1.8)

## 2024-06-26 LAB — LIPID PANEL
Cholesterol: 184 mg/dL (ref <200)
HDL: 83 mg/dL (ref >=50)
LDL Cholesterol (Calc): 81 mg/dL
Non-HDL Cholesterol (Calc): 101 mg/dL (ref <130)
Total CHOL/HDL Ratio: 2.2 (calc) (ref <5.0)
Triglycerides: 100 mg/dL (ref <150)

## 2024-06-26 LAB — TSH: TSH: 7.33 m[IU]/L — ABNORMAL HIGH (ref 0.40–4.50)

## 2024-06-26 NOTE — Progress Notes (Signed)
 MCV and MCH are slightly elevated. Rest of CBC WNL. We will continue to monitor.  Glucose is 102, rest of CMP WNL  Lipid panel WNL  T4 WNL TSH is elevated-7.33-please notify the patient and forward results to Dr. [place] as requested by the patient.

## 2024-06-27 ENCOUNTER — Encounter: Payer: Self-pay | Admitting: Family Medicine

## 2024-06-27 MED ORDER — LEVOTHYROXINE SODIUM 137 MCG PO TABS: 137.0000 ug | ORAL_TABLET | Freq: Every day | ORAL | Status: DC

## 2024-06-27 NOTE — Assessment & Plan Note (Signed)
 Problem is well controlled, no concerns about this today. Continue Symbicort  160-4.5 mcg twice daily as needed. Continue annual follow ups.

## 2024-07-30 ENCOUNTER — Other Ambulatory Visit (INDEPENDENT_AMBULATORY_CARE_PROVIDER_SITE_OTHER)

## 2024-07-30 DIAGNOSIS — E890 Postprocedural hypothyroidism: Secondary | ICD-10-CM

## 2024-07-30 DIAGNOSIS — E785 Hyperlipidemia, unspecified: Secondary | ICD-10-CM | POA: Diagnosis not present

## 2024-07-30 LAB — TSH: TSH: 5.5 u[IU]/mL (ref 0.35–5.50)

## 2024-07-30 LAB — T4, FREE: Free T4: 0.73 ng/dL (ref 0.60–1.60)

## 2024-07-30 LAB — LIPID PANEL
Cholesterol: 194 mg/dL (ref 0–200)
HDL: 81.9 mg/dL (ref >39.00)
LDL Cholesterol: 99 mg/dL (ref 0–99)
NonHDL: 111.74
Total CHOL/HDL Ratio: 2
Triglycerides: 66 mg/dL (ref 0.0–149.0)
VLDL: 13.2 mg/dL (ref 0.0–40.0)

## 2024-08-03 ENCOUNTER — Other Ambulatory Visit: Payer: Self-pay | Admitting: Family Medicine

## 2024-08-03 ENCOUNTER — Ambulatory Visit: Payer: Self-pay | Admitting: Family Medicine

## 2024-08-03 DIAGNOSIS — E890 Postprocedural hypothyroidism: Secondary | ICD-10-CM

## 2024-09-03 ENCOUNTER — Other Ambulatory Visit (INDEPENDENT_AMBULATORY_CARE_PROVIDER_SITE_OTHER)

## 2024-09-03 DIAGNOSIS — E890 Postprocedural hypothyroidism: Secondary | ICD-10-CM

## 2024-09-03 LAB — TSH: TSH: 5.78 u[IU]/mL — ABNORMAL HIGH (ref 0.35–5.50)

## 2024-09-04 ENCOUNTER — Ambulatory Visit: Payer: Self-pay | Admitting: Family Medicine

## 2024-09-04 ENCOUNTER — Other Ambulatory Visit: Payer: Self-pay | Admitting: Family Medicine

## 2024-09-04 DIAGNOSIS — E890 Postprocedural hypothyroidism: Secondary | ICD-10-CM

## 2024-09-04 MED ORDER — SYNTHROID 150 MCG PO TABS: 150.0000 ug | ORAL_TABLET | Freq: Every day | ORAL | 0 refills | Status: DC

## 2024-09-06 NOTE — Telephone Encounter (Signed)
 Patient informed and voiced understanding of her labs and start of her increase of her thyroid  medication. Patient did not have any further questions or concerns.

## 2024-09-10 ENCOUNTER — Other Ambulatory Visit: Payer: Self-pay | Admitting: Family Medicine

## 2024-09-10 DIAGNOSIS — Z1231 Encounter for screening mammogram for malignant neoplasm of breast: Secondary | ICD-10-CM

## 2024-09-11 ENCOUNTER — Telehealth: Payer: Self-pay

## 2024-09-11 NOTE — Telephone Encounter (Signed)
 Please advise ?   Copied from CRM #[id number]. Topic: Clinical - Prescription Issue >> Sep 11, 2024 10:30 AM Robinson H wrote: Reason for CRM: Patient states that she went to pharmacy to pick up the levothyroxine  (SYNTHROID ) 137 MCG tablet and provider sent in a prescription for an increase to the SYNTHROID  150 MCG tablet and stated do not substitute, Patient states new medication is $140.00 dollars and wants to know if Dr. [name] wants her to switch, states she's always been on the generic. Pharmacy needs a new prescription for the SYNTHROID  150 MCG tablet if she switches it back since the current prescription states do not substitute.  Jerel [phone]

## 2024-09-16 ENCOUNTER — Other Ambulatory Visit: Payer: Self-pay

## 2024-09-16 ENCOUNTER — Telehealth: Payer: Self-pay | Admitting: *Deleted

## 2024-09-16 DIAGNOSIS — E890 Postprocedural hypothyroidism: Secondary | ICD-10-CM

## 2024-09-16 MED ORDER — LEVOTHYROXINE SODIUM 150 MCG PO TABS: 150.0000 ug | ORAL_TABLET | Freq: Every day | ORAL | 0 refills | Status: AC

## 2024-09-16 NOTE — Telephone Encounter (Signed)
 Medication has been changed to levothyroxine  150 mg.

## 2024-09-16 NOTE — Telephone Encounter (Signed)
 Copied from CRM #[id number]. Topic: Clinical - Prescription Issue >> Sep 11, 2024 10:30 AM Robinson H wrote: Reason for CRM: Patient states that she went to pharmacy to pick up the levothyroxine  (SYNTHROID ) 137 MCG tablet and provider sent in a prescription for an increase to the SYNTHROID  150 MCG tablet and stated do not substitute, Patient states new medication is $140.00 dollars and wants to know if Dr. [name] wants her to switch, states she's always been on the generic. Pharmacy needs a new prescription for the SYNTHROID  150 MCG tablet if she switches it back since the current prescription states do not substitute.  Pennie [phone] >> Sep 16, 2024  4:18 PM Amy B wrote: 2nd attempt.  Patient requests prescription of levothyroxine  137 MCG tablet .  She cannot afford Synthroid .  Please send correct prescription to the pharmacy.

## 2024-09-17 ENCOUNTER — Other Ambulatory Visit: Payer: Self-pay | Admitting: Physician Assistant

## 2024-09-17 NOTE — Telephone Encounter (Signed)
 Last Fill: 10/24/2023  Eye exam: 06/13/2024   Labs: 06/25/2024 MCV and MCH are slightly elevated. Rest of CBC WNL. We will continue to monitor.  Glucose is 102, rest of CMP WNL  Lipid panel WNL  T4 WNL TSH is elevated-7.33  Next Visit: 11/26/2024  Last Visit: 06/25/2024  DX: Rheumatoid arthritis of multiple sites with negative rheumatoid factor (HCC) + anti-CCP   Current Dose per office note on 06/25/2024: Plaquenil  200 mg 1 tablet by mouth twice daily.   Okay to refill Plaquenil ?

## 2024-09-23 ENCOUNTER — Encounter: Payer: Self-pay | Admitting: Family Medicine

## 2024-09-24 ENCOUNTER — Ambulatory Visit: Payer: Self-pay

## 2024-09-24 ENCOUNTER — Ambulatory Visit (INDEPENDENT_AMBULATORY_CARE_PROVIDER_SITE_OTHER): Admitting: Family Medicine

## 2024-09-24 ENCOUNTER — Encounter: Payer: Self-pay | Admitting: Family Medicine

## 2024-09-24 ENCOUNTER — Ambulatory Visit

## 2024-09-24 VITALS — BP 124/84 | HR 71 | Temp 97.3°F | Resp 16 | Ht 66.0 in | Wt 188.6 lb

## 2024-09-24 DIAGNOSIS — R059 Cough, unspecified: Secondary | ICD-10-CM

## 2024-09-24 DIAGNOSIS — J45901 Unspecified asthma with (acute) exacerbation: Secondary | ICD-10-CM

## 2024-09-24 DIAGNOSIS — J988 Other specified respiratory disorders: Secondary | ICD-10-CM | POA: Diagnosis not present

## 2024-09-24 MED ORDER — ALBUTEROL SULFATE HFA 108 (90 BASE) MCG/ACT IN AERS: 2.0000 | INHALATION_SPRAY | Freq: Four times a day (QID) | RESPIRATORY_TRACT | 0 refills | Status: AC | PRN

## 2024-09-24 MED ORDER — PREDNISONE 20 MG PO TABS: 40.0000 mg | ORAL_TABLET | Freq: Every day | ORAL | 0 refills | Status: AC

## 2024-09-24 MED ORDER — AZITHROMYCIN 250 MG PO TABS: ORAL_TABLET | ORAL | 0 refills | Status: AC

## 2024-09-24 NOTE — Patient Instructions (Addendum)
 A few things to remember from today's visit:  Cough, unspecified type - Plan: DG Chest 2 View  Respiratory tract infection - Plan: azithromycin  (ZITHROMAX ) 250 MG tablet  Moderate asthma with exacerbation, unspecified whether persistent - Plan: albuterol  (VENTOLIN  HFA) 108 (90 Base) MCG/ACT inhaler, predniSONE  (DELTASONE ) 20 MG tablet Take prednisone  with breakfast for 3 to 5 days. Albuterol  2 puffs every 6 hours for 7 days then as needed. No changes in [place].  If you need refills for medications you take chronically, please call your pharmacy. Do not use My Chart to request refills or for acute issues that need immediate attention. If you send a my chart message, it may take a few days to be addressed, specially if I am not in the office.  Please be sure medication list is accurate. If a new problem present, please set up appointment sooner than planned today.

## 2024-09-24 NOTE — Telephone Encounter (Signed)
 FYI Only or Action Required?: FYI only for provider: appointment scheduled on 1/13.  Patient was last seen in primary care on 06/24/2024 by Mercedes Hodge, [name], MD.  Called Nurse Triage reporting Shortness of Breath.  Symptoms began several weeks ago.  Interventions attempted: Prescription medications: wixela inhaler and Rest, hydration, or home remedies.  Symptoms are: gradually worsening.  Triage Disposition: See HCP Within 4 Hours (Or PCP Triage)  Patient/caregiver understands and will follow disposition?: Yes      Copied from CRM #[id number]. Topic: Clinical - Red Word Triage >> Sep 24, 2024  8:39 AM Mercedes Hodge wrote: Red Word that prompted transfer to Nurse Triage: coughing, mucous, wheezing. Sob. Reason for Disposition  [1] Longstanding difficulty breathing (e.Hodge., CHF, COPD, emphysema) AND [2] WORSE than normal  Answer Assessment - Initial Assessment Questions This RN recommended pt be examined in next 4 hours, scheduled for earliest available with Dr. [name] per pt and PCP office request. Advised call back or seek immediate care if new or worsening symptoms.    Pt has Wixela inhaler but no rescue inhaler  Symptoms since before 09/12/24: SOB more than usual only with coughing Mild wheezing  Denies: Worse SOB with exertion or at rest Stridor Changes to awareness/consciousness Chest pain Hx of blood clot in legs or lungs Fever  Protocols used: Breathing Difficulty-A-AH

## 2024-09-24 NOTE — Progress Notes (Signed)
 "  ACUTE VISIT Chief Complaint  Patient presents with   Shortness of Breath    With wheezing, cough x 14 days   Discussed the use of AI scribe software for clinical note transcription with the patient, who gave verbal consent to proceed. History of Present Illness Mercedes Hodge is a 69 year old female with a PMHx significant for RA on Plaquenil  200 mg bid, HTN,HLD,asthma, and OA who presents with a persistent cough, wheezing, and mild shortness of breath for 14 days.  She has been experiencing a persistent cough, wheezing, and shortness of breath for 14 days. The symptoms began with a sore throat, which progressed to a dry throat and mild cough. The cough has since worsened and is now accompanied by wheezing and DOE. These symptoms have not improved over time and are interfering with her sleep, particularly with wheezing at night.  She has been using over-the-counter medications including Mucinex, Delsym, Tessalon Perles DM, and Alka-Seltzer Plus for cold, but these have not provided relief. Asthma: Currently she is on Wixela 250-50 mcg twice daily as needed.  No fever, chills, abdominal pain, nausea, or significant nasal congestion. Persistent postnasal drainage. She reports some heartburn, which she believes may be caused by cough syrup use.  Review of Systems  Constitutional:  Positive for activity change and fatigue. Negative for appetite change and chills.  HENT:  Negative for sore throat.   Gastrointestinal:  Negative for nausea and vomiting.  Genitourinary:  Negative for decreased urine volume, dysuria and hematuria.  Skin:  Negative for rash.  Allergic/Immunologic: Positive for environmental allergies.  Neurological:  Negative for syncope, weakness and headaches.  See other pertinent positives and negatives in HPI.  Medications Ordered Prior to Encounter[1]  Past Medical History:  Diagnosis Date   Allergy    Arthritis    Asthma    GERD (gastroesophageal reflux  disease)    Hyperlipidemia    Hypertension    Thyroid  cancer (HCC)    thyroid    Thyroid  disease    Postsurgical hypothyroidism   Allergies[2]  Social History   Socioeconomic History   Marital status: Married    Spouse name: Not on file   Number of children: Not on file   Years of education: Not on file   Highest education level: Not on file  Occupational History   Not on file  Tobacco Use   Smoking status: Never    Passive exposure: Past   Smokeless tobacco: Never  Vaping Use   Vaping status: Never Used  Substance and Sexual Activity   Alcohol use: Yes    Comment: occ wine    Drug use: Never   Sexual activity: Yes    Birth control/protection: None  Other Topics Concern   Not on file  Social History Narrative   Not on file   Social Drivers of Health   Tobacco Use: Low Risk (09/24/2024)   Patient History    Smoking Tobacco Use: Never    Smokeless Tobacco Use: Never    Passive Exposure: Past  Financial Resource Strain: Low Risk (10/16/2023)   Overall Financial Resource Strain (CARDIA)    Difficulty of Paying Living Expenses: Not hard at all  Food Insecurity: No Food Insecurity (10/16/2023)   Hunger Vital Sign    Worried About Running Out of Food in the Last Year: Never true    Ran Out of Food in the Last Year: Never true  Transportation Needs: No Transportation Needs (10/16/2023)   PRAPARE - Transportation  Lack of Transportation (Medical): No    Lack of Transportation (Non-Medical): No  Physical Activity: Sufficiently Active (10/16/2023)   Exercise Vital Sign    Days of Exercise per Week: 3 days    Minutes of Exercise per Session: 150+ min  Stress: No Stress Concern Present (10/16/2023)   [employer] of Occupational Health - Occupational Stress Questionnaire    Feeling of Stress : Not at all  Social Connections: Socially Integrated (10/16/2023)   Social Connection and Isolation Panel    Frequency of Communication with Friends and Family: More than three times  a week    Frequency of Social Gatherings with Friends and Family: More than three times a week    Attends Religious Services: More than 4 times per year    Active Member of Clubs or Organizations: Yes    Attends Banker Meetings: More than 4 times per year    Marital Status: Married  Depression (PHQ2-9): Low Risk (10/16/2023)   Depression (PHQ2-9)    PHQ-2 Score: 0  Alcohol Screen: Low Risk (10/16/2023)   Alcohol Screen    Last Alcohol Screening Score (AUDIT): 0  Housing: Unknown (10/16/2023)   Housing Stability Vital Sign    Unable to Pay for Housing in the Last Year: No    Number of Times Moved in the Last Year: Not on file    Homeless in the Last Year: No  Utilities: Not At Risk (10/16/2023)   AHC Utilities    Threatened with loss of utilities: No  Health Literacy: Adequate Health Literacy (10/16/2023)   B1300 Health Literacy    Frequency of need for help with medical instructions: Never   Vitals:   09/24/24 1606  BP: 124/84  Pulse: 71  Resp: 16  Temp: (!) 97.3 F (36.3 C)  SpO2: 96%   Body mass index is 30.44 kg/m.  Physical Exam Vitals and nursing note reviewed.  Constitutional:      General: She is not in acute distress.    Appearance: She is well-developed. She is not ill-appearing.  HENT:     Head: Normocephalic and atraumatic.     Mouth/Throat:     Mouth: Mucous membranes are moist.     Pharynx: Postnasal drip present. No posterior oropharyngeal erythema.  Eyes:     Conjunctiva/sclera: Conjunctivae normal.  Cardiovascular:     Rate and Rhythm: Normal rate and regular rhythm.     Heart sounds: No murmur heard. Pulmonary:     Effort: Pulmonary effort is normal. No respiratory distress.     Breath sounds: No stridor. Wheezing and rhonchi present.     Comments: Persistent cough during visit, aggravated by forced expiration. Musculoskeletal:     Cervical back: No edema or erythema. No muscular tenderness.  Lymphadenopathy:     Cervical: No cervical  adenopathy.  Skin:    General: Skin is warm.     Findings: No erythema or rash.  Neurological:     Mental Status: She is alert and oriented to person, place, and time.  Psychiatric:        Mood and Affect: Mood and affect normal.   ASSESSMENT AND PLAN:  Mercedes Hodge was seen today for shortness of breath.  Diagnoses and all orders for this visit:  Moderate asthma with exacerbation, unspecified whether persistent After verbal consent, she received DuoNeb neb treatment.  Ventilation improved, wheezing decreased, and now her mild rhonchi bibasal. No rales. Recommend prednisone  40 mg daily for 3 to 5 days. Albuterol  2 puff  every 6 hours for a week then as needed. Continue Wixela 250-50 mcg twice daily. Instructed about warning signs.  -     albuterol  (VENTOLIN  HFA) 108 (90 Base) MCG/ACT inhaler; Inhale 2 puffs into the lungs every 6 (six) hours as needed for wheezing or shortness of breath. -     predniSONE  (DELTASONE ) 20 MG tablet; Take 2 tablets (40 mg total) by mouth daily with breakfast for 5 days.  Cough, unspecified type -     DG Chest 2 View; Future  Respiratory tract infection CXR done today: Right base opacity. Hemodynamically stable, she is not in respiratory distress. Recommend azithromycin  for 5 days. Pending chest x-ray report.  -     azithromycin  (ZITHROMAX ) 250 MG tablet; Take 2 tablets on day 1, then 1 tablet daily on days 2 through 5  Return if symptoms worsen or fail to improve, for keep next appointment.  Darinda [place] G. Annalynne Ibanez, MD  [facility] [place] office.     [1]  Current Outpatient Medications on File Prior to Visit  Medication Sig Dispense Refill   amLODipine -benazepril  (LOTREL) 5-40 MG capsule TAKE 1 CAPSULE BY MOUTH DAILY 100 capsule 2   calcium carbonate (OS-CAL) 600 MG tablet Take 600 mg by mouth daily.     Cholecalciferol (VITAMIN D ) 50 MCG (2000 UT) tablet Take 2,000 Units by mouth daily.     diclofenac sodium (VOLTAREN) 1 %  GEL Apply topically as needed.     estradiol  (ESTRACE ) 0.1 MG/GM vaginal cream Place 1 Applicatorful vaginally once a week. 42.5 g 3   fluticasone  (FLONASE) 50 MCG/ACT nasal spray Place into both nostrils as needed for allergies or rhinitis.     fluticasone -salmeterol (WIXELA INHUB) 250-50 MCG/ACT AEPB Inhale 1 puff into the lungs in the morning and at bedtime. 180 each 2   hydrocortisone  (ANUSOL -HC) 25 MG suppository Place 1 suppository (25 mg total) rectally 2 (two) times daily. Use for 5 days and as directed 12 suppository 1   hydroxychloroquine  (PLAQUENIL ) 200 MG tablet TAKE 1 TABLET BY MOUTH TWICE  DAILY 180 tablet 0   levothyroxine  (SYNTHROID ) 150 MCG tablet Take 1 tablet (150 mcg total) by mouth daily before breakfast. 90 tablet 0   Omega-3 Fatty Acids (FISH OIL) 600 MG CAPS Take 1 tablet by mouth daily.     pramoxine-hydrocortisone  (ANALPRAM  HC) cream Apply topically 2 (two) times daily as needed. For 7 days 30 g 6   pravastatin  (PRAVACHOL ) 40 MG tablet TAKE 1 TABLET BY MOUTH DAILY 100 tablet 2   No current facility-administered medications on file prior to visit.  [2]  Allergies Allergen Reactions   Lovenox [Enoxaparin Sodium] Rash    rash   "

## 2024-10-02 ENCOUNTER — Ambulatory Visit: Payer: Self-pay | Admitting: Family Medicine

## 2024-10-07 ENCOUNTER — Other Ambulatory Visit: Payer: Self-pay | Admitting: Medical Genetics

## 2024-10-09 ENCOUNTER — Other Ambulatory Visit: Payer: Self-pay | Admitting: Family Medicine

## 2024-10-09 DIAGNOSIS — E890 Postprocedural hypothyroidism: Secondary | ICD-10-CM

## 2024-10-10 ENCOUNTER — Ambulatory Visit
Admission: RE | Admit: 2024-10-10 | Discharge: 2024-10-10 | Disposition: A | Discharge status: Home / Self Care | Source: Ambulatory Visit | Attending: Family Medicine | Admitting: Family Medicine

## 2024-10-10 DIAGNOSIS — Z1231 Encounter for screening mammogram for malignant neoplasm of breast: Secondary | ICD-10-CM

## 2024-10-16 ENCOUNTER — Ambulatory Visit: Payer: Self-pay

## 2024-10-16 ENCOUNTER — Ambulatory Visit

## 2024-10-16 NOTE — Telephone Encounter (Signed)
 FYI Only or Action Required?: FYI only for provider: appointment scheduled on 10/18/24.  Patient was last seen in primary care on 09/24/2024 by [name], Betty G, MD.  Called Nurse Triage reporting Cyst and Mass.  Symptoms began yesterday.  Interventions attempted: Nothing.  Symptoms are: unchanged.  Triage Disposition: See Within 3 Days in Office (overriding See Physician Within 24 Hours)  Patient/caregiver understands and will follow disposition?: Yes   Reason for Disposition  [1] Swelling is painful to touch AND [2] no fever  Answer Assessment - Initial Assessment Questions Patient states that she started to feel some pain in left thigh/gluteal area a couple weeks ago and noted a hard lump in the area yesterday. She believes it may be a cyst. She denies any other symptoms at this time. Office visit advised-unable to come in today and would like to see her PCP, scheduled for soonest available. Patient does have wellness visit scheduled for Monday as well.   1. APPEARANCE of SWELLING: What does it look like?     Hard lump  2. SIZE: How large is the swelling? (e.g., inches, cm; or compare to size of pinhead, tip of pen, eraser, coin, pea, grape, ping pong ball)      1 1/2 inch  3. LOCATION: Where is the swelling located?     Left inner thigh/gluteal area  4. ONSET: When did the swelling start?     Yesterday  5. COLOR: What color is it? Is there more than one color?     Red  6. PAIN: Is there any pain? If Yes, ask: How bad is the pain? (Scale 1-10; or mild, moderate, severe)       Yes, 5/10  7. ITCH: Does it itch? If Yes, ask: How bad is the itch?      No  8. CAUSE: What do you think caused the swelling?     Cyst  9 OTHER SYMPTOMS: Do you have any other symptoms? (e.g., fever)     Denies any other symptoms  Protocols used: Skin Lump or Localized Swelling-A-AH  Reason for Triage: Patient has a cyst on her inner thigh that is very hard,and causes  pain.

## 2024-10-18 ENCOUNTER — Encounter: Payer: Self-pay | Admitting: Family Medicine

## 2024-10-18 ENCOUNTER — Ambulatory Visit: Admitting: Family Medicine

## 2024-10-18 VITALS — BP 124/82 | HR 68 | Temp 98.2°F | Resp 16 | Ht 66.0 in | Wt 191.6 lb

## 2024-10-18 DIAGNOSIS — N764 Abscess of vulva: Secondary | ICD-10-CM

## 2024-10-18 DIAGNOSIS — N9089 Other specified noninflammatory disorders of vulva and perineum: Secondary | ICD-10-CM

## 2024-10-18 MED ORDER — CLINDAMYCIN HCL 300 MG PO CAPS: 300.0000 mg | ORAL_CAPSULE | Freq: Three times a day (TID) | ORAL | 0 refills | Status: AC

## 2024-10-18 NOTE — Patient Instructions (Signed)
 A few things to remember from today's visit:  Perineal mass in female - Plan: Ambulatory referral to Gynecology  Abscess, vulva - Plan: clindamycin  (CLEOCIN ) 300 MG capsule Will treat as an abscess. Sitz bath with Epson salt. Clindamycin  for 7 days. Monitor for new symptoms.  If you need refills for medications you take chronically, please call your pharmacy. Do not use My Chart to request refills or for acute issues that need immediate attention. If you send a my chart message, it may take a few days to be addressed, specially if I am not in the office.  Please be sure medication list is accurate. If a new problem present, please set up appointment sooner than planned today.

## 2024-10-18 NOTE — Progress Notes (Signed)
 "  ACUTE VISIT Chief Complaint  Patient presents with   Mass    Under the fold of her buttocks area X 4 days - hard bump increased in size - its not hot , not red.    Discussed the use of AI scribe software for clinical note transcription with the patient, who gave verbal consent to proceed.  History of Present Illness Mercedes Hodge is a 69 year old female with a PMHx significant for RA on Plaquenil  200 mg bid, HTN,HLD,asthma, and OA who presents with a perineal mass as described above.  She felt a hard, mildly tender mass located in the perineal area, right under major left labia.There is no associated redness or ecchymosis, but it may have grown slightly over the past four days. Pain is more noticeable when she sits. No hx of similar lesions in the past. She has had Bartolini abscesses in the past but lesion was closer to vaginal introitus.  She has not taken any over-the-counter treatments.   No fever, chills, body aches, urinary symptoms, vaginal discharge, or vaginal bleeding are present.   Review of Systems  Constitutional:  Negative for activity change and appetite change.  HENT:  Negative for mouth sores and sore throat.   Gastrointestinal:  Negative for abdominal pain, nausea and vomiting.  Genitourinary:  Negative for decreased urine volume, dysuria, genital sores and hematuria.  Skin:  Negative for rash.  Neurological:  Negative for syncope and weakness.  See other pertinent positives and negatives in HPI.  Medications Ordered Prior to Encounter[1]  Past Medical History:  Diagnosis Date   Allergy    Arthritis    Asthma    GERD (gastroesophageal reflux disease)    Hyperlipidemia    Hypertension    Thyroid  cancer (HCC)    thyroid    Thyroid  disease    Postsurgical hypothyroidism   Allergies[2]  Social History   Socioeconomic History   Marital status: Married    Spouse name: Not on file   Number of children: Not on file   Years of education: Not on  file   Highest education level: Not on file  Occupational History   Not on file  Tobacco Use   Smoking status: Never    Passive exposure: Past   Smokeless tobacco: Never  Vaping Use   Vaping status: Never Used  Substance and Sexual Activity   Alcohol use: Yes    Comment: occ wine    Drug use: Never   Sexual activity: Yes    Birth control/protection: None  Other Topics Concern   Not on file  Social History Narrative   Not on file   Social Drivers of Health   Tobacco Use: Low Risk (10/18/2024)   Patient History    Smoking Tobacco Use: Never    Smokeless Tobacco Use: Never    Passive Exposure: Past  Financial Resource Strain: Low Risk (10/16/2023)   Overall Financial Resource Strain (CARDIA)    Difficulty of Paying Living Expenses: Not hard at all  Food Insecurity: No Food Insecurity (10/16/2023)   Hunger Vital Sign    Worried About Running Out of Food in the Last Year: Never true    Ran Out of Food in the Last Year: Never true  Transportation Needs: No Transportation Needs (10/16/2023)   PRAPARE - Administrator, Civil Service (Medical): No    Lack of Transportation (Non-Medical): No  Physical Activity: Sufficiently Active (10/16/2023)   Exercise Vital Sign    Days of  Exercise per Week: 3 days    Minutes of Exercise per Session: 150+ min  Stress: No Stress Concern Present (10/16/2023)   [employer] of Occupational Health - Occupational Stress Questionnaire    Feeling of Stress : Not at all  Social Connections: Socially Integrated (10/16/2023)   Social Connection and Isolation Panel    Frequency of Communication with Friends and Family: More than three times a week    Frequency of Social Gatherings with Friends and Family: More than three times a week    Attends Religious Services: More than 4 times per year    Active Member of Clubs or Organizations: Yes    Attends Banker Meetings: More than 4 times per year    Marital Status: Married  Depression  (PHQ2-9): Low Risk (10/16/2023)   Depression (PHQ2-9)    PHQ-2 Score: 0  Alcohol Screen: Low Risk (10/16/2023)   Alcohol Screen    Last Alcohol Screening Score (AUDIT): 0  Housing: Unknown (10/16/2023)   Housing Stability Vital Sign    Unable to Pay for Housing in the Last Year: No    Number of Times Moved in the Last Year: Not on file    Homeless in the Last Year: No  Utilities: Not At Risk (10/16/2023)   [employer] Utilities    Threatened with loss of utilities: No  Health Literacy: Adequate Health Literacy (10/16/2023)   B1300 Health Literacy    Frequency of need for help with medical instructions: Never   Vitals:   10/18/24 1529  BP: 124/82  Pulse: 68  Resp: 16  Temp: 98.2 F (36.8 C)  SpO2: 98%   Body mass index is 30.93 kg/m.  Physical Exam Vitals and nursing note reviewed.  Constitutional:      General: She is not in acute distress.    Appearance: She is well-developed.  HENT:     Head: Normocephalic and atraumatic.  Eyes:     Conjunctiva/sclera: Conjunctivae normal.  Pulmonary:     Effort: No respiratory distress.     Breath sounds: Normal breath sounds.  Abdominal:     Palpations: Abdomen is soft. There is no mass.     Tenderness: There is no abdominal tenderness.  Genitourinary:  Skin:    General: Skin is warm.     Findings: No erythema or rash.  Neurological:     General: No focal deficit present.     Mental Status: She is alert and oriented to person, place, and time.     Comments: Mild limping, otherwise stable gait, not assisted.  Psychiatric:        Mood and Affect: Mood and affect normal.    ASSESSMENT AND PLAN:  Ms. Clayton Jarmon was seen today for mass in perineal area.  Diagnoses and all orders for this visit: Orders Placed This Encounter  Procedures   Ambulatory referral to Gynecology   Perineal mass in female We discussed possible etiologies. ?  Abscess, Bartholin cyst, lipoma among some to consider.  Recommend surgical evaluation with  gynecologist, will need to be removed if it does not resolve with abx treatment.  -     Ambulatory referral to Gynecology  Abscess, vulva Will treat as infectious process, ?  Bartholin abscess. We discussed some side effects of antibiotic treatment. Monitor for new symptoms. Instructed about warning signs.  -     Clindamycin  HCl; Take 1 capsule (300 mg total) by mouth 3 (three) times daily for 7 days.  Dispense: 21 capsule; Refill: 0  Return if symptoms worsen or fail to improve, for keep next appointment.  Zyria Fiscus G. Joi Leyva, MD  [facility]. [place] office.     [1]  Current Outpatient Medications on File Prior to Visit  Medication Sig Dispense Refill   albuterol  (VENTOLIN  HFA) 108 (90 Base) MCG/ACT inhaler Inhale 2 puffs into the lungs every 6 (six) hours as needed for wheezing or shortness of breath. 8 g 0   amLODipine -benazepril  (LOTREL) 5-40 MG capsule TAKE 1 CAPSULE BY MOUTH DAILY 100 capsule 2   calcium carbonate (OS-CAL) 600 MG tablet Take 600 mg by mouth daily.     Cholecalciferol (VITAMIN D ) 50 MCG (2000 UT) tablet Take 2,000 Units by mouth daily.     diclofenac sodium (VOLTAREN) 1 % GEL Apply topically as needed.     estradiol  (ESTRACE ) 0.1 MG/GM vaginal cream Place 1 Applicatorful vaginally once a week. (Patient taking differently: Place 1 Applicatorful vaginally once a week. As needed) 42.5 g 3   fluticasone  (FLONASE) 50 MCG/ACT nasal spray Place into both nostrils as needed for allergies or rhinitis.     fluticasone -salmeterol (WIXELA INHUB) 250-50 MCG/ACT AEPB Inhale 1 puff into the lungs in the morning and at bedtime. 180 each 2   hydrocortisone  (ANUSOL -HC) 25 MG suppository Place 1 suppository (25 mg total) rectally 2 (two) times daily. Use for 5 days and as directed (Patient taking differently: Place 25 mg rectally 2 (two) times daily. Use for 5 days and as directed  As needed) 12 suppository 1   hydroxychloroquine  (PLAQUENIL ) 200 MG tablet TAKE 1 TABLET  BY MOUTH TWICE  DAILY 180 tablet 0   levothyroxine  (SYNTHROID ) 150 MCG tablet Take 1 tablet (150 mcg total) by mouth daily before breakfast. 90 tablet 0   Omega-3 Fatty Acids (FISH OIL) 600 MG CAPS Take 1 tablet by mouth daily.     pramoxine-hydrocortisone  (ANALPRAM  HC) cream Apply topically 2 (two) times daily as needed. For 7 days 30 g 6   pravastatin  (PRAVACHOL ) 40 MG tablet TAKE 1 TABLET BY MOUTH DAILY 100 tablet 2   No current facility-administered medications on file prior to visit.  [2]  Allergies Allergen Reactions   Lovenox [Enoxaparin Sodium] Rash    rash   "

## 2024-10-21 ENCOUNTER — Ambulatory Visit: Payer: Medicare Other

## 2024-10-24 ENCOUNTER — Other Ambulatory Visit: Payer: Self-pay

## 2024-11-08 ENCOUNTER — Other Ambulatory Visit

## 2024-11-26 ENCOUNTER — Ambulatory Visit: Admitting: Rheumatology

## 2025-07-01 ENCOUNTER — Encounter: Admitting: Family Medicine
